# Patient Record
Sex: Male | Born: 1965 | Race: White | Hispanic: No | Marital: Married | Smoking: Never smoker
Health system: Southern US, Community
[De-identification: ages and names within clinical notes are randomized; demographics above are authoritative.]

## PROBLEM LIST (undated history)

## (undated) DIAGNOSIS — Z87442 Personal history of urinary calculi: Secondary | ICD-10-CM

## (undated) DIAGNOSIS — Z8481 Family history of carrier of genetic disease: Secondary | ICD-10-CM

## (undated) DIAGNOSIS — Z803 Family history of malignant neoplasm of breast: Secondary | ICD-10-CM

## (undated) DIAGNOSIS — E785 Hyperlipidemia, unspecified: Secondary | ICD-10-CM

## (undated) DIAGNOSIS — E119 Type 2 diabetes mellitus without complications: Secondary | ICD-10-CM

## (undated) DIAGNOSIS — B019 Varicella without complication: Secondary | ICD-10-CM

## (undated) DIAGNOSIS — F909 Attention-deficit hyperactivity disorder, unspecified type: Secondary | ICD-10-CM

## (undated) DIAGNOSIS — G4730 Sleep apnea, unspecified: Secondary | ICD-10-CM

## (undated) DIAGNOSIS — I10 Essential (primary) hypertension: Secondary | ICD-10-CM

## (undated) HISTORY — DX: Family history of carrier of genetic disease: Z84.81

## (undated) HISTORY — DX: Hyperlipidemia, unspecified: E78.5

## (undated) HISTORY — PX: SHOULDER SURGERY: SHX246

## (undated) HISTORY — DX: Type 2 diabetes mellitus without complications: E11.9

## (undated) HISTORY — DX: Family history of malignant neoplasm of breast: Z80.3

## (undated) HISTORY — DX: Varicella without complication: B01.9

## (undated) HISTORY — PX: MENISCUS REPAIR: SHX5179

---

## 2004-11-03 ENCOUNTER — Ambulatory Visit (HOSPITAL_COMMUNITY): Admission: RE | Admit: 2004-11-03 | Discharge: 2004-11-03 | Payer: Self-pay | Admitting: Family Medicine

## 2007-06-10 ENCOUNTER — Ambulatory Visit (HOSPITAL_COMMUNITY): Admission: RE | Admit: 2007-06-10 | Discharge: 2007-06-11 | Payer: Self-pay | Admitting: Orthopedic Surgery

## 2009-02-14 ENCOUNTER — Ambulatory Visit: Payer: Self-pay | Admitting: Specialist

## 2010-11-14 NOTE — Op Note (Signed)
James [name]              ACCOUNT NO.:  [id number]   MEDICAL RECORD NO.:  [id number]          PATIENT TYPE:  AMB   LOCATION:  SDS                          FACILITY:  [facility]   PHYSICIAN:  Burnard Bunting, M.D.    DATE OF BIRTH:  08-13-1965   DATE OF PROCEDURE:  06/10/2007  DATE OF DISCHARGE:                               OPERATIVE REPORT   PREOPERATIVE DIAGNOSIS:  Left shoulder labral tearing, acromioclavicular  joint arthritis, bursitis, extensive synovitis.   POSTOPERATIVE DIAGNOSIS:  Left shoulder labral tearing,  acromioclavicular joint arthritis, bursitis, extensive synovitis.   PROCEDURE PERFORMED:  Left shoulder diagnostic and operative  arthroscopy, with extensive debridement in biceps, extensive debridement  of labrum and synovitis, biceps tendon release, arthroscopic subacromial  decompression, open distal clavicle excision, open biceps tenodesis.   SURGEON:  Burnard Bunting, M.D.   ASSISTANT:  None.   ANESTHESIA:  General endotracheal.   ESTIMATED BLOOD LOSS:  50 mL.   DRAINS:  None.   INDICATIONS:  James [name] is a 45 year old male with left shoulder  pain, acromioclavicular joint arthritis by magnetic resonance imaging  scan, as well as labral tearing by magnetic resonance imaging scan, and  bursitis.  He is brought for operative management for failure of  conservative therapy.   PROCEDURE IN DETAIL:  The patient was brought to the operating room,  where general endotracheal anesthesia was induced and preoperative  antibiotics were administered.  The left shoulder was examined under  anesthesia and found to have forward flexion of 170, external rotation  of 15 degrees, abduction to 70, and good shoulder stability.  The  patient was placed in the beach chair position, with the head in neutral  position and the right arm well-padded.  His left shoulder, arm, and  hand were prepped and draped with antiseptic solution and draped in a  sterile manner.  James [name]  was used to cover the operative field.  Topographic mapping of the shoulder was then done, including the  posterolateral and anterior margin of the acromion, as well as the  coracoid process.  20 mL of saline was injected into the shoulder joint.  The scope was placed into the shoulder joint.  Diagnostic arthroscopy  was performed.  Synovitis was present.  An unstable degenerative labral  tear anterior and posterior to the biceps tendon was noted.  This was  felt to be un-repairable.  The biceps tendon was released.  Extensive  debridement of the labrum and surrounding synovitis was performed.  The  rotator cuff was intact.  The glenohumeral articular surface was intact.  Following extensive debridement and biceps tendon release, the scope was  placed into the subacromial space and a lateral port was created.  Subacromial decompression was performed with release, but no resection  of the CA ligament.  The rotator cuff appeared intact after the bursal  resection was performed.  At this time, the instruments were removed  from the portals, which were closed using 3-0 nylon suture.  An incision  was then made over the distal clavicle.  The skin and subcutaneous  tissue were sharply divided.  Full-thickness flaps were then developed  of the periosteum on the anterior and posterior aspect of the clavicle.  Approximately 10 mm of the distal clavicle was resected with the  oscillating saw.  The incision was irrigated.  The subperiosteal flaps  with the fascia was then closed using interrupted inverted #0 Vicryl  suture, and an interrupted inverted 2-0 Vicryl suture approximating the  skin edges.  This time, the anterior portal was extended distally.  The  deltoid was split and measured a distance of 3.5 cm from its anterior  portion.  Self-retaining numbers.  A #0 Vicryl suture was placed at the  base of the split.  The bicipital groove was then identified and the  transverse humeral ligament was  incised on the medial aspect.  The  biceps tendon was delivered into the incision and a #2 FiberWire was  placed through the biceps tendon.  The biceps tendon was then tenodesed  into a 7-mm drill hole made in the mid portion of the humeral head away  from the circumflex arteries but within the bicipital groove.  Secure  fixation was achieved at this time.  At this time, thorough irrigation  was performed.  The deltoid split was repaired using a #0 Vicryl suture.  The skin was closed using interrupted inverted #0 Vicryl suture, 2-0  Vicryl suture, and 3-0 pull-out Prolene on both incisions.  It should be  noted that this patient's morbid obesity made the case extremely  difficult in terms of retraction and gaining access to the joint.  Both  open procedures, the distal clavicle excision, and the biceps tenodesis  required significantly more retraction and effort than would be typical  for a non-morbidly obese patient.  The patient's body mass index easily  exceeds 35.  A bulky dressing was placed on the shoulder.  The shoulder  was placed in a shoulder immobilizer.  He was then transferred to the  recovery room in stable condition.      Burnard Bunting, M.D.  Electronically Signed     GSD/MEDQ  D:  06/10/2007  T:  06/10/2007  Job:  [id number]

## 2011-04-09 LAB — BASIC METABOLIC PANEL
BUN: 14
CO2: 27
Creatinine, Ser: 1.05
Potassium: 3.8
Sodium: 138

## 2011-04-09 LAB — CBC
HCT: 46.1
Hemoglobin: 15.6
MCHC: 33.9
RBC: 5.21
WBC: 8.5

## 2011-08-06 ENCOUNTER — Other Ambulatory Visit: Payer: Self-pay

## 2011-08-06 ENCOUNTER — Encounter (HOSPITAL_COMMUNITY): Payer: Self-pay | Admitting: Pharmacy Technician

## 2011-08-06 ENCOUNTER — Encounter (HOSPITAL_COMMUNITY): Payer: Self-pay

## 2011-08-06 ENCOUNTER — Encounter (HOSPITAL_COMMUNITY)
Admission: RE | Admit: 2011-08-06 | Discharge: 2011-08-06 | Disposition: A | Discharge status: Home / Self Care | Payer: BC Managed Care – PPO | Source: Ambulatory Visit | Attending: Urology | Admitting: Urology

## 2011-08-06 HISTORY — DX: Sleep apnea, unspecified: G47.30

## 2011-08-06 HISTORY — DX: Essential (primary) hypertension: I10

## 2011-08-06 LAB — SURGICAL PCR SCREEN: Staphylococcus aureus: POSITIVE — AB

## 2011-08-06 NOTE — Patient Instructions (Addendum)
20 Tykel Badie  08/06/2011   Your procedure is scheduled on:  08/07/11  Report to [facility] at 07:10 AM.  Call this number if you have problems the morning of surgery: [phone]   Remember:   Do not eat food:After Midnight.  May have clear liquids:until Midnight .  Clear liquids include soda, tea, black coffee, apple or grape juice, broth.  Take these medicines the morning of surgery with A SIP OF WATER: Lisionpril and Flomax. Take your Oxycodone only if needed.   Do not wear jewelry, make-up or nail polish.  Do not wear lotions, powders, or perfumes. You may wear deodorant.  Do not shave 48 hours prior to surgery.  Do not bring valuables to the hospital.  Contacts, dentures or bridgework may not be worn into surgery.  Leave suitcase in the car. After surgery it may be brought to your room.  For patients admitted to the hospital, checkout time is 11:00 AM the day of discharge.   Patients discharged the day of surgery will not be allowed to drive home.  Name and phone number of your driver:   Special Instructions: CHG Shower Use Special Wash: 1/2 bottle night before surgery and 1/2 bottle morning of surgery.   Please read over the following fact sheets that you were given: Pain Booklet, MRSA Information, Surgical Site Infection Prevention, Anesthesia Post-op Instructions and Care and Recovery After Surgery    Cystoscopy (Bladder Exam) Care After Refer to this sheet in the next few weeks. These discharge instructions provide you with general information on caring for yourself after you leave the hospital. Your caregiver may also give you specific instructions. Your treatment has been planned according to the most current medical practices available, but unavoidable complications sometimes occur. If you have any problems or questions after discharge, please call your caregiver. AFTER THE PROCEDURE   There may be temporary bleeding and burning with urination.   Drink enough water and  fluids to keep your urine clear or pale yellow.  FINDING OUT THE RESULTS OF YOUR TEST Not all test results are available during your visit. If your test results are not back during the visit, make an appointment with your caregiver to find out the results. Do not assume everything is normal if you have not heard from your caregiver or the medical facility. It is important for you to follow up on all of your test results. SEEK IMMEDIATE MEDICAL CARE IF:   There is an increase in blood in the urine or you are passing clots.   There is difficulty passing urine.   You develop the chills.   You have an oral temperature above 102 F (38.9 C), not controlled by medicine.   Belly (abdominal) pain develops.  Document Released: 01/05/2005 Document Revised: 02/28/2011 Document Reviewed: 11/03/2007 [facility] Patient Information 2012 [place], [place].   PATIENT INSTRUCTIONS POST-ANESTHESIA  IMMEDIATELY FOLLOWING SURGERY:  Do not drive or operate machinery for the first twenty four hours after surgery.  Do not make any important decisions for twenty four hours after surgery or while taking narcotic pain medications or sedatives.  If you develop intractable nausea and vomiting or a severe headache please notify your doctor immediately.  FOLLOW-UP:  Please make an appointment with your surgeon as instructed. You do not need to follow up with anesthesia unless specifically instructed to do so.  WOUND CARE INSTRUCTIONS (if applicable):  Keep a dry clean dressing on the anesthesia/puncture wound site if there is drainage.  Once the wound  has quit draining you may leave it open to air.  Generally you should leave the bandage intact for twenty four hours unless there is drainage.  If the epidural site drains for more than 36-48 hours please call the anesthesia department.  QUESTIONS?:  Please feel free to call your physician or the hospital operator if you have any questions, and they will be happy to assist  you.     [facility] Anesthesia Department [address] [place] [phone]

## 2011-08-07 ENCOUNTER — Ambulatory Visit (HOSPITAL_COMMUNITY): Payer: BC Managed Care – PPO

## 2011-08-07 ENCOUNTER — Ambulatory Visit (HOSPITAL_COMMUNITY): Payer: BC Managed Care – PPO | Admitting: Anesthesiology

## 2011-08-07 ENCOUNTER — Encounter (HOSPITAL_COMMUNITY): Payer: Self-pay | Admitting: Anesthesiology

## 2011-08-07 ENCOUNTER — Ambulatory Visit (HOSPITAL_COMMUNITY)
Admission: RE | Admit: 2011-08-07 | Discharge: 2011-08-07 | Disposition: A | Discharge status: Home / Self Care | Payer: BC Managed Care – PPO | Source: Ambulatory Visit | Attending: Urology | Admitting: Urology

## 2011-08-07 ENCOUNTER — Encounter (HOSPITAL_COMMUNITY): Payer: Self-pay | Admitting: *Deleted

## 2011-08-07 ENCOUNTER — Encounter (HOSPITAL_COMMUNITY)
Admission: RE | Disposition: A | Discharge status: Home / Self Care | Payer: Self-pay | Source: Ambulatory Visit | Attending: Urology

## 2011-08-07 DIAGNOSIS — I10 Essential (primary) hypertension: Secondary | ICD-10-CM | POA: Insufficient documentation

## 2011-08-07 DIAGNOSIS — Z01812 Encounter for preprocedural laboratory examination: Secondary | ICD-10-CM | POA: Insufficient documentation

## 2011-08-07 DIAGNOSIS — G4733 Obstructive sleep apnea (adult) (pediatric): Secondary | ICD-10-CM | POA: Insufficient documentation

## 2011-08-07 DIAGNOSIS — Z01810 Encounter for preprocedural cardiovascular examination: Secondary | ICD-10-CM | POA: Insufficient documentation

## 2011-08-07 DIAGNOSIS — Z79899 Other long term (current) drug therapy: Secondary | ICD-10-CM | POA: Insufficient documentation

## 2011-08-07 DIAGNOSIS — N201 Calculus of ureter: Secondary | ICD-10-CM | POA: Insufficient documentation

## 2011-08-07 HISTORY — PX: CYSTOSCOPY W/ URETERAL STENT PLACEMENT: SHX1429

## 2011-08-07 SURGERY — CYSTOSCOPY, WITH RETROGRADE PYELOGRAM AND URETERAL STENT INSERTION
Anesthesia: General | Laterality: Left | Wound class: Clean Contaminated

## 2011-08-07 MED ORDER — LIDOCAINE HCL 1 % IJ SOLN
INTRAMUSCULAR | Status: DC | PRN
  Administered 2011-08-07: 30 mg via INTRADERMAL

## 2011-08-07 MED ORDER — MIDAZOLAM HCL 2 MG/2ML IJ SOLN
1.0000 mg | INTRAMUSCULAR | Status: DC | PRN
  Administered 2011-08-07 (×2): 2 mg via INTRAVENOUS

## 2011-08-07 MED ORDER — SODIUM CHLORIDE 0.9 % IR SOLN
Status: DC | PRN
  Administered 2011-08-07: 6000 mL

## 2011-08-07 MED ORDER — ONDANSETRON HCL 4 MG/2ML IJ SOLN
4.0000 mg | Freq: Once | INTRAMUSCULAR | Status: AC
  Administered 2011-08-07: 4 mg via INTRAVENOUS

## 2011-08-07 MED ORDER — FENTANYL CITRATE 0.05 MG/ML IJ SOLN
INTRAMUSCULAR | Status: DC | PRN
  Administered 2011-08-07 (×3): 50 ug via INTRAVENOUS

## 2011-08-07 MED ORDER — ONDANSETRON HCL 4 MG/2ML IJ SOLN: 4.0000 mg | Freq: Once | INTRAMUSCULAR | Status: DC | PRN

## 2011-08-07 MED ORDER — MIDAZOLAM HCL 2 MG/2ML IJ SOLN
INTRAMUSCULAR | Status: AC
  Filled 2011-08-07: qty 2

## 2011-08-07 MED ORDER — FENTANYL CITRATE 0.05 MG/ML IJ SOLN
INTRAMUSCULAR | Status: AC
  Filled 2011-08-07: qty 5

## 2011-08-07 MED ORDER — LIDOCAINE HCL (PF) 1 % IJ SOLN
INTRAMUSCULAR | Status: AC
  Filled 2011-08-07: qty 5

## 2011-08-07 MED ORDER — ONDANSETRON HCL 4 MG/2ML IJ SOLN
INTRAMUSCULAR | Status: AC
  Filled 2011-08-07: qty 2

## 2011-08-07 MED ORDER — PROPOFOL 10 MG/ML IV EMUL
INTRAVENOUS | Status: AC
  Filled 2011-08-07: qty 20

## 2011-08-07 MED ORDER — LACTATED RINGERS IV SOLN
INTRAVENOUS | Status: DC
  Administered 2011-08-07: 09:00:00 via INTRAVENOUS

## 2011-08-07 MED ORDER — ROCURONIUM BROMIDE 100 MG/10ML IV SOLN
INTRAVENOUS | Status: DC | PRN
  Administered 2011-08-07: 35 mg via INTRAVENOUS

## 2011-08-07 MED ORDER — ROCURONIUM BROMIDE 50 MG/5ML IV SOLN
INTRAVENOUS | Status: AC
  Filled 2011-08-07: qty 1

## 2011-08-07 MED ORDER — IOHEXOL 350 MG/ML SOLN
INTRAVENOUS | Status: DC | PRN
  Administered 2011-08-07: 50 mL

## 2011-08-07 MED ORDER — FENTANYL CITRATE 0.05 MG/ML IJ SOLN: 25.0000 ug | INTRAMUSCULAR | Status: DC | PRN

## 2011-08-07 MED ORDER — MUPIROCIN 2 % EX OINT
TOPICAL_OINTMENT | CUTANEOUS | Status: AC
  Filled 2011-08-07: qty 22

## 2011-08-07 MED ORDER — PROPOFOL 10 MG/ML IV BOLUS
INTRAVENOUS | Status: DC | PRN
  Administered 2011-08-07: 200 mg via INTRAVENOUS

## 2011-08-07 MED ORDER — GLYCOPYRROLATE 0.2 MG/ML IJ SOLN
INTRAMUSCULAR | Status: AC
  Filled 2011-08-07: qty 1

## 2011-08-07 MED ORDER — NEOSTIGMINE METHYLSULFATE 1 MG/ML IJ SOLN
INTRAMUSCULAR | Status: AC
  Filled 2011-08-07: qty 10

## 2011-08-07 MED ORDER — STERILE WATER FOR IRRIGATION IR SOLN
Status: DC | PRN
  Administered 2011-08-07: 1000 mL

## 2011-08-07 SURGICAL SUPPLY — 20 items
BAG DRAIN URO TABLE W/ADPT NS (DRAPE) ×2 IMPLANT
CATH 5 FR WEDGE TIP (UROLOGICAL SUPPLIES) ×2 IMPLANT
CLOTH BEACON ORANGE TIMEOUT ST (SAFETY) ×2 IMPLANT
DILATOR UROMAX ULTRA (MISCELLANEOUS) ×2 IMPLANT
GLOVE BIO SURGEON STRL SZ7 (GLOVE) ×2 IMPLANT
GLOVE ECLIPSE 7.0 STRL STRAW (GLOVE) ×2 IMPLANT
GLOVE EXAM NITRILE MD LF STRL (GLOVE) ×2 IMPLANT
GLOVE INDICATOR 7.5 STRL GRN (GLOVE) ×2 IMPLANT
GOWN STRL REIN XL XLG (GOWN DISPOSABLE) ×4 IMPLANT
IV NS IRRIG 3000ML ARTHROMATIC (IV SOLUTION) ×4 IMPLANT
KIT ROOM TURNOVER AP CYSTO (KITS) ×2 IMPLANT
LASER FIBER DISP (UROLOGICAL SUPPLIES) IMPLANT
LASER FIBER DISP 1000U (UROLOGICAL SUPPLIES) IMPLANT
MANIFOLD NEPTUNE II (INSTRUMENTS) ×2 IMPLANT
PACK CYSTO (CUSTOM PROCEDURE TRAY) ×2 IMPLANT
PAD ARMBOARD 7.5X6 YLW CONV (MISCELLANEOUS) ×2 IMPLANT
STENT PERCUFLEX 4.8FRX24 (STENTS) ×2 IMPLANT
STONE RETRIEVAL GEMINI 2.4 FR (MISCELLANEOUS) IMPLANT
TOWEL OR 17X26 4PK STRL BLUE (TOWEL DISPOSABLE) IMPLANT
WIRE GUIDE BENTSON .035 15CM (WIRE) ×2 IMPLANT

## 2011-08-07 NOTE — Anesthesia Procedure Notes (Signed)
Procedure Name: Intubation Date/Time: 08/07/2011 9:41 AM Performed by: Glynn Octave Pre-anesthesia Checklist: Patient identified, Patient being monitored, Timeout performed, Emergency Drugs available and Suction available Patient Re-evaluated:Patient Re-evaluated prior to inductionOxygen Delivery Method: Circle System Utilized Preoxygenation: Pre-oxygenation with 100% oxygen Intubation Type: IV induction Ventilation: Mask ventilation without difficulty Laryngoscope Size: Mac and 3 Grade View: Grade I Tube type: Oral Tube size: 7.0 mm Number of attempts: 1 Airway Equipment and Method: stylet Placement Confirmation: ETT inserted through vocal cords under direct vision,  positive ETCO2 and breath sounds checked- equal and bilateral Secured at: 22 cm Tube secured with: Tape Dental Injury: Teeth and Oropharynx as per pre-operative assessment

## 2011-08-07 NOTE — Brief Op Note (Signed)
08/07/2011  10:21 AM  PATIENT:  James [name]  46 y.o. male  PRE-OPERATIVE DIAGNOSIS:  left ureteral calculus  POST-OPERATIVE DIAGNOSIS:  left ureteral calculus  PROCEDURE:  Procedure(s): CYSTOSCOPY WITH RETROGRADE PYELOGRAM/URETERAL STENT PLACEMENT  SURGEON:  Surgeon(s): Ky Barban, MD  PHYSICIAN ASSISTANT:   ASSISTANTS: none   ANESTHESIA:   general  EBL:  Total I/O In: 200 [I.V.:200] Out: -   BLOOD ADMINISTERED:none  DRAINS: none   LOCAL MEDICATIONS USED:  NONE  SPECIMEN:  No Specimen  DISPOSITION OF SPECIMEN:  N/A  COUNTS:  YES  TOURNIQUET:  * No tourniquets in log *  DICTATION: .dictation#[id number]  PLAN OF CARE: Discharge to home after PACU  PATIENT DISPOSITION:  PACU - hemodynamically stable.   Delay start of Pharmacological VTE agent (>24hrs) due to surgical blood loss or risk of bleeding:  {YES

## 2011-08-07 NOTE — Transfer of Care (Signed)
Immediate Anesthesia Transfer of Care Note  [name]  Procedure(s) Performed:  CYSTOSCOPY WITH RETROGRADE PYELOGRAM/URETERAL STENT PLACEMENT - Cystoscopy, Left Retrograde Pyelogram, Left Ureteral Ballon Dilation, Double J Stent Placement  Patient Location: PACU  Anesthesia Type: General  Level of Consciousness: awake, alert  and oriented  Airway & Oxygen Therapy: Patient Spontanous Breathing and Patient connected to face mask oxygen  Post-op Assessment: Report given to PACU RN  Post vital signs: Reviewed and stable  Complications: No apparent anesthesia complications

## 2011-08-07 NOTE — Progress Notes (Signed)
Needs to void. Urinal provided. Unable to void. Wants to get up and go to BR. Rates pain 0.

## 2011-08-07 NOTE — Progress Notes (Signed)
No change in H&P on reexamination. 

## 2011-08-07 NOTE — Consult Note (Signed)
NAMEELVERT, CUMPTON              ACCOUNT NO.:  [id number]  MEDICAL RECORD NO.:  [id number]  LOCATION:                                 FACILITY:  PHYSICIAN:  Ky Barban, M.D.DATE OF BIRTH:  [birth year]  DATE OF CONSULTATION: DATE OF DISCHARGE:                                HISTORY AND PHYSICAL  CHIEF COMPLAINT:  Recurrent left renal colic.  HISTORY:  46 year old gentleman having recurrent episodes in the left flank since last Sunday.  He has to go to the emergency room twice because of continued pain.  CT scan shows 4-mm stone in the distal left ureter causing partial obstruction.  He called today that he is having so much pain.  He wants something done about it.  So I told him to come to the office.  He was having considerable pain, so I advised him that we can do one of these two things, either lithotripsy of stone basket, but I will recommend to have stone basket.  Procedure of stone basket was explained thoroughly to him and to his wife, and its complications were discussed, especially ureteral perforation leading to open surgery, requiring longer period of time to recover and use of double-J stent, and stone migration.  They were all discussed with him.  He understands and they want me to go ahead and proceed.  He will be coming in the morning and undergo cystoscopy left retrograde pyelogram, ureteroscopic stone basket extraction, holmium laser lithotripsy, insertion of double-J stent.  Under anesthesia as an outpatient.  PAST MEDICAL HISTORY:  He does have hypertension and takes medicines. No history of diabetes, but he says he is borderline diabetic, just watches his diet.  PREVIOUS SURGICAL HISTORY:  Surgery on both shoulders 5 years ago and 3 years ago, and no other surgery.  FAMILY HISTORY:  No history of prostate cancer.  PERSONAL HISTORY:  He does not smoke or drink.  REVIEW OF SYSTEMS:  Unremarkable.  PHYSICAL EXAMINATION:  GENERAL:  Moderately  obese male, not in acute distress. VITAL SIGNS:  Blood pressure 140/80, temperature is normal. NEUROLOGICAL:  Central nervous system no gross neurological deficit. HEAD, NECK, EYES, ENT:  negative CHEST:  Symmetrical. HEART:  Regular sinus rhythm.  No murmur. ABDOMEN:  Soft and flat.  Liver, spleen, kidneys not palpable.  1+ left CVA tenderness. GENITALIA :  External genitalia is circumcised meatus adequate. Testicles are normal.  Rectal examination is deferred.  Extremities are normal.  IMPRESSIONS:  Recurrent left renal colic, distal left ureteral calculus.  PLAN:  Cystoscopy, left retrograde pyelogram, ureteroscopic stone basket extraction, and holmium laser lithotripsy, insertion of double-J stent under anesthesia as outpatient.     Ky Barban, M.D.     MIJ/MEDQ  D:  08/06/2011  T:  08/07/2011  Job:  [id number]

## 2011-08-07 NOTE — Anesthesia Preprocedure Evaluation (Signed)
Anesthesia Evaluation  Patient identified by MRN, date of birth, ID band Patient awake    Reviewed: Allergy & Precautions, H&P , NPO status , Patient's Chart, lab work & pertinent test results  History of Anesthesia Complications Negative for: history of anesthetic complications  Airway Mallampati: I      Dental  (+) Teeth Intact   Pulmonary sleep apnea and Continuous Positive Airway Pressure Ventilation ,  clear to auscultation        Cardiovascular hypertension, Regular Normal    Neuro/Psych    GI/Hepatic   Endo/Other  Morbid obesity  Renal/GU      Musculoskeletal   Abdominal   Peds  Hematology   Anesthesia Other Findings   Reproductive/Obstetrics                           Anesthesia Physical Anesthesia Plan  ASA: II  Anesthesia Plan: General   Post-op Pain Management:    Induction: Intravenous  Airway Management Planned: Oral ETT  Additional Equipment:   Intra-op Plan:   Post-operative Plan: Extubation in OR  Informed Consent: I have reviewed the patients History and Physical, chart, labs and discussed the procedure including the risks, benefits and alternatives for the proposed anesthesia with the patient or authorized representative who has indicated his/her understanding and acceptance.     Plan Discussed with:   Anesthesia Plan Comments:         Anesthesia Quick Evaluation

## 2011-08-07 NOTE — Anesthesia Postprocedure Evaluation (Signed)
  Anesthesia Post-op Note  [name]  Procedure(s) Performed:  CYSTOSCOPY WITH RETROGRADE PYELOGRAM/URETERAL STENT PLACEMENT - Cystoscopy, Left Retrograde Pyelogram, Left Ureteral Ballon Dilation, Double J Stent Placement  Patient Location: PACU  Anesthesia Type: General  Level of Consciousness: awake, alert  and oriented  Airway and Oxygen Therapy: Patient Spontanous Breathing and Patient connected to face mask oxygen  Post-op Pain: none  Post-op Assessment: Post-op Vital signs reviewed, Patient's Cardiovascular Status Stable, Respiratory Function Stable and No signs of Nausea or vomiting  Post-op Vital Signs: Reviewed and stable  Complications: No apparent anesthesia complications

## 2011-08-08 NOTE — Op Note (Signed)
NAMEETHON, WYMER              ACCOUNT NO.:  [id number]  MEDICAL RECORD NO.:  [id number]  LOCATION:                                 FACILITY:  PHYSICIAN:  Ky Barban, M.D.DATE OF BIRTH:  Jul 31, 1965  DATE OF PROCEDURE: DATE OF DISCHARGE:                              OPERATIVE REPORT   PREOPERATIVE DIAGNOSIS:  Left ureteral calculus.  POSTOPERATIVE DIAGNOSIS:  Left ureteral calculus.  PROCEDURE:  Cystoscopy, left retrograde pyelogram, ureteroscopic stone basket extraction, and insertion of double-J stent 5-French 24 cm with string attached.  ANESTHESIA:  General endotracheal.  PROCEDURE IN DETAIL:  The patient under general endotracheal anesthesia in lithotomy position, usual prep and drape, #25 cystoscope introduced into the bladder and bladder was inspected.  Left ureteral orifice catheterized with a wedge catheter Hypaque injected under fluoroscopic control.  There is a filling defect in the ureterovesical junction.  The ureter above that is dilated, grade 2.  Now a guidewire was passed up into the renal pelvis and cystoscope was removed.  Over the guidewire #5 balloon dilator was introduced and the intramural ureter was dilated. The balloon was removed along with the cystoscope and alongside the guidewire, I introduced short rigid ureteroscope, went to the level of the stone.  Stone came out.  When we took the balloon and it fell into the bladder.  The ureter looks fine and I went up all the way in the mid ureter.  No other stone is seen.  The ureteroscope was removed and I introduced the cystoscope, and the stone came out, and I picked up. Over the guidewire, I introduced double-J stent 5-French 24 cm.  It was positioned within the renal pelvis and the bladder.  After removing the guidewire, nice loop in the bladder and the renal pelvis, was obtained, and the string was left attached.  The patient left the operating room in satisfactory  condition.     Ky Barban, M.D.     MIJ/MEDQ  D:  08/07/2011  T:  08/08/2011  Job:  [id number]

## 2011-08-10 ENCOUNTER — Encounter (HOSPITAL_COMMUNITY): Payer: Self-pay | Admitting: Urology

## 2011-09-21 ENCOUNTER — Other Ambulatory Visit (HOSPITAL_COMMUNITY): Payer: Self-pay | Admitting: Internal Medicine

## 2011-09-21 DIAGNOSIS — R918 Other nonspecific abnormal finding of lung field: Secondary | ICD-10-CM

## 2011-09-25 ENCOUNTER — Ambulatory Visit (HOSPITAL_COMMUNITY)
Admission: RE | Admit: 2011-09-25 | Discharge: 2011-09-25 | Disposition: A | Discharge status: Home / Self Care | Payer: BC Managed Care – PPO | Source: Ambulatory Visit | Attending: Internal Medicine | Admitting: Internal Medicine

## 2011-09-25 DIAGNOSIS — R918 Other nonspecific abnormal finding of lung field: Secondary | ICD-10-CM | POA: Insufficient documentation

## 2014-04-21 DIAGNOSIS — G4733 Obstructive sleep apnea (adult) (pediatric): Secondary | ICD-10-CM | POA: Insufficient documentation

## 2014-10-15 ENCOUNTER — Other Ambulatory Visit: Payer: Self-pay | Admitting: Orthopedic Surgery

## 2014-10-15 DIAGNOSIS — M25512 Pain in left shoulder: Secondary | ICD-10-CM

## 2014-11-02 ENCOUNTER — Other Ambulatory Visit: Payer: BC Managed Care – PPO

## 2014-11-23 ENCOUNTER — Other Ambulatory Visit: Payer: BC Managed Care – PPO

## 2015-03-14 ENCOUNTER — Ambulatory Visit: Payer: BC Managed Care – PPO | Admitting: Family

## 2015-03-23 ENCOUNTER — Encounter: Payer: Self-pay | Admitting: Family

## 2015-03-23 ENCOUNTER — Ambulatory Visit (INDEPENDENT_AMBULATORY_CARE_PROVIDER_SITE_OTHER): Payer: BC Managed Care – PPO | Admitting: Family

## 2015-03-23 ENCOUNTER — Other Ambulatory Visit (INDEPENDENT_AMBULATORY_CARE_PROVIDER_SITE_OTHER): Payer: BC Managed Care – PPO

## 2015-03-23 VITALS — BP 138/92 | HR 78 | Temp 98.5°F | Resp 18 | Ht 67.0 in | Wt 289.0 lb

## 2015-03-23 DIAGNOSIS — IMO0002 Reserved for concepts with insufficient information to code with codable children: Secondary | ICD-10-CM

## 2015-03-23 DIAGNOSIS — E1165 Type 2 diabetes mellitus with hyperglycemia: Secondary | ICD-10-CM | POA: Diagnosis not present

## 2015-03-23 DIAGNOSIS — Z23 Encounter for immunization: Secondary | ICD-10-CM | POA: Diagnosis not present

## 2015-03-23 DIAGNOSIS — R21 Rash and other nonspecific skin eruption: Secondary | ICD-10-CM | POA: Diagnosis not present

## 2015-03-23 LAB — MICROALBUMIN / CREATININE URINE RATIO
CREATININE, U: 88.2 mg/dL
MICROALB UR: 1.2 mg/dL (ref 0.0–1.9)
Microalb Creat Ratio: 1.4 mg/g (ref 0.0–30.0)

## 2015-03-23 LAB — HEMOGLOBIN A1C: Hgb A1c MFr Bld: 10.2 % — ABNORMAL HIGH (ref 4.6–6.5)

## 2015-03-23 MED ORDER — MUPIROCIN CALCIUM 2 % EX CREA: 1.0000 "application " | TOPICAL_CREAM | Freq: Two times a day (BID) | CUTANEOUS | Status: DC

## 2015-03-23 NOTE — Progress Notes (Signed)
Pre visit review using our clinic review tool, if applicable. No additional management support is needed unless otherwise documented below in the visit note. 

## 2015-03-23 NOTE — Assessment & Plan Note (Signed)
Rash appears to related to irritation. Keep clean and dry. Start mupirocin for inflammation and prevent infection. Follow up if symptoms worsen or fail to improve.

## 2015-03-23 NOTE — Assessment & Plan Note (Addendum)
Diabetes most likely uncontrolled with current regimen secondary to multiple courses of prednisone and nasal corticosteroids. Takes his medications as prescribed and has not had any recent hypoglycemic events. Diabetic eye exam is up to date. Diabetic foot exam completed today. Obtain A1c and urine microalbumin. Due for pneumovax which will be administered at next visit secondary to multiple injections today. Currently on simvastatin and lisinopril. Continue current dosage of metformin, Novolog and Levemir. Follow up pending A1c results.

## 2015-03-23 NOTE — Progress Notes (Addendum)
Subjective:    Patient ID: James [name], male    DOB: 1966/02/21, 49 y.o.   MRN: [id number]  Chief Complaint  Patient presents with  . Establish Care    needs A1c check, thinks its high bc he was taking steroids, wants to talk about insulin, and states he can not get his sugars are not stable they are up and down    HPI:  James [name] is a 49 y.o. male who  has a past medical history of Hypertension; Sleep apnea; Hyperlipidemia; Diabetes mellitus without complication; Chicken pox; and Kidney stones. and presents today for an office visit to establish care.  1.) Type 2 diabetes - Previously diagnosed with Type 2 diabetes and is currently maintained on Levemir, Novolog and metformin. Takes his medications as prescribed and denies adverse side effects or hypoglycemic events. Reports recently taking steroids and having difficulty getting his sugars stable. Morning blood sugars have normally around 80-110 and with steroids around 160-180. Has had episodes of higher blood sugar during the day. Indicates that he has stopped taking the all of the steroids and his blood sugars are slowly reducing back to where they were prior. Diabetic eye exam is up to date from about 8 months ago. Has lost 30 pounds in the last 2 months and is working on exercise as well.  2.) Rash - This is a new problem. Associated symptom of a rash located in his left axilla has been going on for several days since he was out in the rain with a wet shirt. Described as red and irritated. Denies any modifying factors or attempted treatments.    No Known Allergies   Outpatient Prescriptions Prior to Visit  Medication Sig Dispense Refill  . lisinopril (PRINIVIL,ZESTRIL) 20 MG tablet Take 20 mg by mouth daily.    Marland Kitchen ibuprofen (ADVIL,MOTRIN) 200 MG tablet Take 400 mg by mouth every 6 (six) hours as needed. For pain    . oxyCODONE-acetaminophen (PERCOCET) 5-325 MG per tablet Take 1 tablet by mouth every 4 (four) hours as  needed. For pain     No facility-administered medications prior to visit.     Past Medical History  Diagnosis Date  . Hypertension   . Sleep apnea     CiPaP at night  . Hyperlipidemia   . Diabetes mellitus without complication   . Chicken pox   . Kidney stones      Past Surgical History  Procedure Laterality Date  . Shoulder surgery      bilateral  . Cystoscopy w/ ureteral stent placement  08/07/2011    Procedure: CYSTOSCOPY WITH RETROGRADE PYELOGRAM/URETERAL STENT PLACEMENT;  Surgeon: Ky Barban, MD;  Location: [facility];  Service: Urology;  Laterality: Left;  Cystoscopy, Left Retrograde Pyelogram, Left Ureteral Ballon Dilation, Double J Stent Placement     Family History  Problem Relation Age of Onset  . Anesthesia problems Neg Hx   . Hypotension Neg Hx   . Malignant hyperthermia Neg Hx   . Pseudochol deficiency Neg Hx   . Prostate cancer Father   . Diabetes Father      Social History   Social History  . Marital Status: Married    Spouse Name: N/A  . Number of Children: 2  . Years of Education: 14   Occupational History  . Maintenance Mechanic    Social History Main Topics  . Smoking status: Never Smoker   . Smokeless tobacco: Never Used  . Alcohol Use: No  .  Drug Use: No  . Sexual Activity: Not on file   Other Topics Concern  . Not on file   Social History Narrative   Fun: Works in his shop and work on [employer], farm   Denies religious beliefs effecting health care.     Review of Systems  Constitutional: Negative for fatigue.  Respiratory: Negative for chest tightness and shortness of breath.   Cardiovascular: Negative for chest pain, palpitations and leg swelling.  Endocrine: Negative for polydipsia, polyphagia and polyuria.  Skin: Positive for rash.  Neurological: Negative for headaches.      Objective:    BP 138/92 mmHg  Pulse 78  Temp(Src) 98.5 F (36.9 C) (Oral)  Resp 18  Ht  (1.702 m)  Wt 289 lb (131.09 kg)  BMI 45.25  kg/m2  SpO2 97% Nursing note and vital signs reviewed.  Physical Exam  Constitutional: He is oriented to person, place, and time. He appears well-developed and well-nourished. No distress.  Cardiovascular: Normal rate, regular rhythm, normal heart sounds and intact distal pulses.   Pulmonary/Chest: Effort normal and breath sounds normal.  Neurological: He is alert and oriented to person, place, and time.  Diabetic foot exam - bilateral feet are free from skin breakdown, cuts, and abrasions. Toenails are neatly trimmed. Pulses are intact and appropriate. Sensation is intact to monofilament bilaterally.  Skin: Skin is warm and dry.  Red and raw appearing skin with no evidence of skin breakdown located under the left axilla.   Psychiatric: He has a normal mood and affect. His behavior is normal. Judgment and thought content normal.       Assessment & Plan:   Problem List Items Addressed This Visit      Musculoskeletal and Integument   Rash and nonspecific skin eruption    Rash appears to related to irritation. Keep clean and dry. Start mupirocin for inflammation and prevent infection. Follow up if symptoms worsen or fail to improve.       Relevant Medications   mupirocin cream (BACTROBAN) 2 %     Other   Type 2 diabetes mellitus, uncontrolled - Primary    Diabetes most likely uncontrolled with current regimen secondary to multiple courses of prednisone and nasal corticosteroids. Takes his medications as prescribed and has not had any recent hypoglycemic events. Diabetic eye exam is up to date. Diabetic foot exam completed today. Obtain A1c and urine microalbumin. Due for pneumovax which will be administered at next visit secondary to multiple injections today. Currently on simvastatin and lisinopril. Continue current dosage of metformin, Novolog and Levemir. Follow up pending A1c results.        Relevant Medications   metFORMIN (GLUCOPHAGE) 500 MG tablet   simvastatin (ZOCOR) 20 MG  tablet   insulin detemir (LEVEMIR) 100 UNIT/ML injection   Insulin Aspart (NOVOLOG [place])   Other Relevant Orders   Hemoglobin A1c (Completed)   Urine Microalbumin w/creat. ratio (Completed)    Other Visit Diagnoses    Encounter for immunization        Need for vaccine for TD (tetanus-diphtheria)        Relevant Orders    Td vaccine greater than or equal to 7yo preservative free IM (Completed)

## 2015-03-23 NOTE — Patient Instructions (Signed)
Thank you for choosing [facility].  Summary/Instructions:  Your prescription(s) have been submitted to your pharmacy or been printed and provided for you. Please take as directed and contact our office if you believe you are having problem(s) with the medication(s) or have any questions.  Pased to MyChart (or called to you) after review, usually within 72 hours after test compllease stop by the lab on the basement level of the building for your blood work. Your results will be releetion. If any changes need to be made, you will be notified at that same time.  If your symptoms worsen or fail to improve, please contact our office for further instruction, or in case of emergency go directly to the emergency room at the closest medical facility.    Diabetes Mellitus and Food It is important for you to manage your blood sugar (glucose) level. Your blood glucose level can be greatly affected by what you eat. Eating healthier foods in the appropriate amounts throughout the day at about the same time each day will help you control your blood glucose level. It can also help slow or prevent worsening of your diabetes mellitus. Healthy eating may even help you improve the level of your blood pressure and reach or maintain a healthy weight.  HOW CAN FOOD AFFECT ME? Carbohydrates Carbohydrates affect your blood glucose level more than any other type of food. Your dietitian will help you determine how many carbohydrates to eat at each meal and teach you how to count carbohydrates. Counting carbohydrates is important to keep your blood glucose at a healthy level, especially if you are using insulin or taking certain medicines for diabetes mellitus. Alcohol Alcohol can cause sudden decreases in blood glucose (hypoglycemia), especially if you use insulin or take certain medicines for diabetes mellitus. Hypoglycemia can be a life-threatening condition. Symptoms of hypoglycemia (sleepiness, dizziness, and  disorientation) are similar to symptoms of having too much alcohol.  If your health care provider has given you approval to drink alcohol, do so in moderation and use the following guidelines:  Women should not have more than one drink per day, and men should not have more than two drinks per day. One drink is equal to:  12 oz of beer.  5 oz of wine.  1 oz of hard liquor.  Do not drink on an empty stomach.  Keep yourself hydrated. Have water, diet soda, or unsweetened iced tea.  Regular soda, juice, and other mixers might contain a lot of carbohydrates and should be counted. WHAT FOODS ARE NOT RECOMMENDED? As you make food choices, it is important to remember that all foods are not the same. Some foods have fewer nutrients per serving than other foods, even though they might have the same number of calories or carbohydrates. It is difficult to get your body what it needs when you eat foods with fewer nutrients. Examples of foods that you should avoid that are high in calories and carbohydrates but low in nutrients include:  Trans fats (most processed foods list trans fats on the Nutrition Facts label).  Regular soda.  Juice.  Candy.  Sweets, such as cake, pie, doughnuts, and cookies.  Fried foods. WHAT FOODS CAN I EAT? Have nutrient-rich foods, which will nourish your body and keep you healthy. The food you should eat also will depend on several factors, including:  The calories you need.  The medicines you take.  Your weight.  Your blood glucose level.  Your blood pressure level.  Your cholesterol  level. You also should eat a variety of foods, including:  Protein, such as meat, poultry, fish, tofu, nuts, and seeds (lean animal proteins are best).  Fruits.  Vegetables.  Dairy products, such as milk, cheese, and yogurt (low fat is best).  Breads, grains, pasta, cereal, rice, and beans.  Fats such as olive oil, trans fat-free margarine, canola oil, avocado, and  olives. DOES EVERYONE WITH DIABETES MELLITUS HAVE THE SAME MEAL PLAN? Because every person with diabetes mellitus is different, there is not one meal plan that works for everyone. It is very important that you meet with a dietitian who will help you create a meal plan that is just right for you. Document Released: 03/15/2005 Document Revised: 06/23/2013 Document Reviewed: 05/15/2013 [facility] Patient Information 2015 [place], [place]. This information is not intended to replace advice given to you by your health care provider. Make sure you discuss any questions you have with your health care provider.  Exercise to Lose Weight Exercise and a healthy diet may help you lose weight. Your doctor may suggest specific exercises. EXERCISE IDEAS AND TIPS  Choose low-cost things you enjoy doing, such as walking, bicycling, or exercising to workout videos.  Take stairs instead of the elevator.  Walk during your lunch break.  Park your car further away from work or school.  Go to a gym or an exercise class.  Start with 5 to 10 minutes of exercise each day. Build up to 30 minutes of exercise 4 to 6 days a week.  Wear shoes with good support and comfortable clothes.  Stretch before and after working out.  Work out until you breathe harder and your heart beats faster.  Drink extra water when you exercise.  Do not do so much that you hurt yourself, feel dizzy, or get very short of breath. Exercises that burn about 150 calories:  Running 1  miles in 15 minutes.  Playing volleyball for 45 to 60 minutes.  Washing and waxing a car for 45 to 60 minutes.  Playing touch football for 45 minutes.  Walking 1  miles in 35 minutes.  Pushing a stroller 1  miles in 30 minutes.  Playing basketball for 30 minutes.  Raking leaves for 30 minutes.  Bicycling 5 miles in 30 minutes.  Walking 2 miles in 30 minutes.  Dancing for 30 minutes.  Shoveling snow for 15 minutes.  Swimming laps for 20  minutes.  Walking up stairs for 15 minutes.  Bicycling 4 miles in 15 minutes.  Gardening for 30 to 45 minutes.  Jumping rope for 15 minutes.  Washing windows or floors for 45 to 60 minutes. Document Released: 07/21/2010 Document Revised: 09/10/2011 Document Reviewed: 07/21/2010 [facility] Patient Information 2015 [place], [place]. This information is not intended to replace advice given to you by your health care provider. Make sure you discuss any questions you have with your health care provider. Diabetes and Exercise Exercising regularly is important. It is not just about losing weight. It has many health benefits, such as:  Improving your overall fitness, flexibility, and endurance.  Increasing your bone density.  Helping with weight control.  Decreasing your body fat.  Increasing your muscle strength.  Reducing stress and tension.  Improving your overall health. People with diabetes who exercise gain additional benefits because exercise:  Reduces appetite.  Improves the body's use of blood sugar (glucose).  Helps lower or control blood glucose.  Decreases blood pressure.  Helps control blood lipids (such as cholesterol and triglycerides).  Improves the  body's use of the hormone insulin by:  Increasing the body's insulin sensitivity.  Reducing the body's insulin needs.  Decreases the risk for heart disease because exercising:  Lowers cholesterol and triglycerides levels.  Increases the levels of good cholesterol (such as high-density lipoproteins [HDL]) in the body.  Lowers blood glucose levels. YOUR ACTIVITY PLAN  Choose an activity that you enjoy and set realistic goals. Your health care provider or diabetes educator can help you make an activity plan that works for you. Exercise regularly as directed by your health care provider. This includes:  Performing resistance training twice a week such as push-ups, sit-ups, lifting weights, or using resistance  bands.  Performing 150 minutes of cardio exercises each week such as walking, running, or playing sports.  Staying active and spending no more than 90 minutes at one time being inactive. Even short bursts of exercise are good for you. Three 10-minute sessions spread throughout the day are just as beneficial as a single 30-minute session. Some exercise ideas include:  Taking the dog for a walk.  Taking the stairs instead of the elevator.  Dancing to your favorite song.  Doing an exercise video.  Doing your favorite exercise with a friend. RECOMMENDATIONS FOR EXERCISING WITH TYPE 1 OR TYPE 2 DIABETES   Check your blood glucose before exercising. If blood glucose levels are greater than 240 mg/dL, check for urine ketones. Do not exercise if ketones are present.  Avoid injecting insulin into areas of the body that are going to be exercised. For example, avoid injecting insulin into:  The arms when playing tennis.  The legs when jogging.  Keep a record of:  Food intake before and after you exercise.  Expected peak times of insulin action.  Blood glucose levels before and after you exercise.  The type and amount of exercise you have done.  Review your records with your health care provider. Your health care provider will help you to develop guidelines for adjusting food intake and insulin amounts before and after exercising.  If you take insulin or oral hypoglycemic agents, watch for signs and symptoms of hypoglycemia. They include:  Dizziness.  Shaking.  Sweating.  Chills.  Confusion.  Drink plenty of water while you exercise to prevent dehydration or heat stroke. Body water is lost during exercise and must be replaced.  Talk to your health care provider before starting an exercise program to make sure it is safe for you. Remember, almost any type of activity is better than none. Document Released: 09/08/2003 Document Revised: 11/02/2013 Document Reviewed:  11/25/2012 [facility] Patient Information 2015 [place], [place]. This information is not intended to replace advice given to you by your health care provider. Make sure you discuss any questions you have with your health care provider.

## 2015-03-24 ENCOUNTER — Telehealth: Payer: Self-pay | Admitting: Family

## 2015-03-24 NOTE — Telephone Encounter (Signed)
Please inform patient that his A1c is 10.8 which indicates that his diabetes is elevated, however we were prepared for numbers like this. Please have him record his morning blood sugars over the next week and call them in for evaluation. If they continue to remain elevated then we may recommend some medication changes. One of the medications I would like him to evaluate would be either Victoza or Bydureon. The benefit of the Victoza would be that it has labeled uses for weight loss in addition to diabetes.

## 2015-03-27 ENCOUNTER — Encounter: Payer: Self-pay | Admitting: Family

## 2015-03-28 NOTE — Telephone Encounter (Signed)
Pt aware of results and to call in with sugars over the next week.

## 2015-05-06 ENCOUNTER — Telehealth: Payer: Self-pay | Admitting: Family

## 2015-05-06 NOTE — Telephone Encounter (Signed)
Pt was wanting to go ahead and switch to the victoza and he had a few questions regarding this. He did not get his previous pen refilled due to this. Please call pt at [phone]

## 2015-05-09 ENCOUNTER — Other Ambulatory Visit: Payer: Self-pay | Admitting: Family

## 2015-05-09 MED ORDER — LIRAGLUTIDE 18 MG/3ML ~~LOC~~ SOPN: PEN_INJECTOR | SUBCUTANEOUS | Status: DC

## 2015-05-09 NOTE — Telephone Encounter (Signed)
Based on his last A1c we would start the Victoza and continue with other insulins. If his blood sugars trend down then we can decrease the multiple daily doses of insulin. Were all of the questions answered?

## 2015-05-09 NOTE — Telephone Encounter (Signed)
Pt wants to try victoza, wants to know if he will be using the other pen as well or stopping it and just using victoza. Please advise

## 2015-05-09 NOTE — Telephone Encounter (Signed)
Tried to call pt to let him know that victoza has been sent and to continue with his current insulins as well. No answer and his VM was full.

## 2015-06-09 ENCOUNTER — Telehealth: Payer: Self-pay | Admitting: *Deleted

## 2015-06-09 DIAGNOSIS — R21 Rash and other nonspecific skin eruption: Secondary | ICD-10-CM

## 2015-06-09 MED ORDER — MUPIROCIN CALCIUM 2 % EX CREA: 1.0000 "application " | TOPICAL_CREAM | Freq: Two times a day (BID) | CUTANEOUS | Status: DC

## 2015-06-09 NOTE — Telephone Encounter (Signed)
Wife left msg on triage yesterday afternoon stating husband need refill on the Bactroban cream. Called pt verified pharmacy inform will send electronically...Raechel Chute/lmb

## 2015-08-09 ENCOUNTER — Encounter: Payer: Self-pay | Admitting: Family

## 2015-08-11 ENCOUNTER — Encounter: Payer: Self-pay | Admitting: Family

## 2015-08-11 DIAGNOSIS — Z794 Long term (current) use of insulin: Principal | ICD-10-CM

## 2015-08-11 DIAGNOSIS — E1165 Type 2 diabetes mellitus with hyperglycemia: Principal | ICD-10-CM

## 2015-08-11 DIAGNOSIS — IMO0001 Reserved for inherently not codable concepts without codable children: Secondary | ICD-10-CM

## 2015-08-13 ENCOUNTER — Other Ambulatory Visit: Payer: Self-pay | Admitting: Family

## 2015-08-16 ENCOUNTER — Encounter: Payer: Self-pay | Admitting: Family

## 2015-08-16 ENCOUNTER — Other Ambulatory Visit: Payer: Self-pay | Admitting: Family

## 2015-08-16 MED ORDER — INSULIN DEGLUDEC 100 UNIT/ML ~~LOC~~ SOPN: 30.0000 [IU] | PEN_INJECTOR | Freq: Every day | SUBCUTANEOUS | Status: DC

## 2015-08-18 ENCOUNTER — Other Ambulatory Visit: Payer: Self-pay

## 2015-08-18 MED ORDER — LIRAGLUTIDE 18 MG/3ML ~~LOC~~ SOPN: PEN_INJECTOR | SUBCUTANEOUS | Status: DC

## 2015-08-31 ENCOUNTER — Encounter: Payer: Self-pay | Admitting: Family

## 2015-09-03 ENCOUNTER — Encounter: Payer: Self-pay | Admitting: Family

## 2015-09-03 DIAGNOSIS — E1165 Type 2 diabetes mellitus with hyperglycemia: Principal | ICD-10-CM

## 2015-09-03 DIAGNOSIS — IMO0001 Reserved for inherently not codable concepts without codable children: Secondary | ICD-10-CM

## 2015-09-03 DIAGNOSIS — Z794 Long term (current) use of insulin: Principal | ICD-10-CM

## 2015-09-05 MED ORDER — INSULIN ASPART 100 UNIT/ML FLEXPEN: 0.0000 [IU] | PEN_INJECTOR | Freq: Three times a day (TID) | SUBCUTANEOUS | Status: DC

## 2015-10-07 ENCOUNTER — Other Ambulatory Visit: Payer: Self-pay | Admitting: Family

## 2015-10-18 ENCOUNTER — Other Ambulatory Visit: Payer: Self-pay

## 2015-10-28 ENCOUNTER — Other Ambulatory Visit: Payer: Self-pay | Admitting: Family

## 2015-11-09 ENCOUNTER — Ambulatory Visit (INDEPENDENT_AMBULATORY_CARE_PROVIDER_SITE_OTHER): Payer: BC Managed Care – PPO

## 2015-11-09 ENCOUNTER — Encounter: Payer: Self-pay | Admitting: Podiatry

## 2015-11-09 ENCOUNTER — Ambulatory Visit (INDEPENDENT_AMBULATORY_CARE_PROVIDER_SITE_OTHER): Payer: BC Managed Care – PPO | Admitting: Podiatry

## 2015-11-09 VITALS — BP 165/105 | HR 89 | Resp 16 | Ht 67.0 in | Wt 285.0 lb

## 2015-11-09 DIAGNOSIS — M79672 Pain in left foot: Secondary | ICD-10-CM | POA: Diagnosis not present

## 2015-11-09 DIAGNOSIS — M722 Plantar fascial fibromatosis: Secondary | ICD-10-CM | POA: Diagnosis not present

## 2015-11-09 MED ORDER — DICLOFENAC SODIUM 75 MG PO TBEC: 75.0000 mg | DELAYED_RELEASE_TABLET | Freq: Two times a day (BID) | ORAL | Status: DC

## 2015-11-09 MED ORDER — TRIAMCINOLONE ACETONIDE 10 MG/ML IJ SUSP
10.0000 mg | Freq: Once | INTRAMUSCULAR | Status: AC
  Administered 2015-11-09: 10 mg

## 2015-11-09 NOTE — Progress Notes (Signed)
   Subjective:    Patient ID: James [name], male    DOB: 03-07-1966, 50 y.o.   MRN: [id number]  HPI Chief Complaint  Patient presents with  . Foot Pain    Left foot; heel; x3 months; pt diabetic type 2; sugar=112 this am; A1C=9.0      Review of Systems  All other systems reviewed and are negative.      Objective:   Physical Exam        Assessment & Plan:

## 2015-11-09 NOTE — Patient Instructions (Signed)

## 2015-11-10 NOTE — Progress Notes (Signed)
Subjective:     Patient ID: James [name], male   DOB: [birth year], 50 y.o.   MRN: [id number]  HPI patient presents stating I had a three-month history of heel pain left that's been getting gradually worse and making walking difficult   Review of Systems  All other systems reviewed and are negative.      Objective:   Physical Exam  Constitutional: He is oriented to person, place, and time.  Cardiovascular: Intact distal pulses.   Musculoskeletal: Normal range of motion.  Neurological: He is oriented to person, place, and time.  Skin: Skin is warm.  Nursing note and vitals reviewed.  neurovascular status is within normal limits with muscle strength adequate and range of motion within normal limits. Patient's noted to have inflammation and pain in the plantar aspect of the left heel at the insertional point tendon into the calcaneus with fluid buildup around the medial band. Patient's found to have good digital perfusion is well oriented 3 with no equinus condition noted     Assessment:     Acute plantar fasciitis left    Plan:     Injected the left plantar fascia 3 mg Kenalog 5 mg Xylocaine and reviewed x-rays with patient. Dispense fascial brace with instructions on usage placed on oral anti-inflammatory agent and instructed on physical therapy. Reappoint to recheck again in 2 weeks  X-ray report indicates spur formation plantar and posterior heel with no indications of stress fracture arthritis

## 2015-11-12 ENCOUNTER — Other Ambulatory Visit: Payer: Self-pay | Admitting: Family

## 2015-11-16 ENCOUNTER — Ambulatory Visit: Payer: BC Managed Care – PPO | Admitting: Podiatry

## 2015-11-24 ENCOUNTER — Encounter: Payer: Self-pay | Admitting: Podiatry

## 2015-11-24 ENCOUNTER — Ambulatory Visit (INDEPENDENT_AMBULATORY_CARE_PROVIDER_SITE_OTHER): Payer: BC Managed Care – PPO | Admitting: Podiatry

## 2015-11-24 DIAGNOSIS — M722 Plantar fascial fibromatosis: Secondary | ICD-10-CM

## 2015-11-24 NOTE — Progress Notes (Signed)
Subjective:     Patient ID: James [name], male   DOB: [birth year], 50 y.o.   MRN: [id number]  HPI patient states I'm feeling quite a bit better but I am on my foot a lot at work and I do have to walk on cement floors and wear composite-type shoes   Review of Systems     Objective:   Physical Exam Neurovascular status intact muscle strength adequate range of motion within normal limits with patient noted to have mild discomfort in the plantar aspect of the left heel at the insertional point of the tendon into the calcaneus. Patient's found to have good digital perfusion at this time    Assessment:     Inflammatory fasciitis of the left heel insertional point    Plan:     H&P and condition reviewed and at this point I recommended long-term orthotics and scanned for custom orthotic devices. Patient will be seen back when those are ready

## 2015-12-21 ENCOUNTER — Ambulatory Visit: Payer: BC Managed Care – PPO | Admitting: *Deleted

## 2015-12-21 DIAGNOSIS — M722 Plantar fascial fibromatosis: Secondary | ICD-10-CM

## 2015-12-21 NOTE — Progress Notes (Signed)
Patient ID: James [name], male   DOB: 1965/10/02, 50 y.o.   MRN: [id number] Patient presents for orthotic pick up.  Verbal and written break in and wear instructions given.  Patient will follow up in 4 weeks if symptoms worsen or fail to improve.

## 2015-12-21 NOTE — Patient Instructions (Signed)

## 2016-01-07 ENCOUNTER — Other Ambulatory Visit: Payer: Self-pay | Admitting: Family

## 2016-02-02 ENCOUNTER — Other Ambulatory Visit: Payer: Self-pay | Admitting: Family

## 2016-02-23 ENCOUNTER — Other Ambulatory Visit: Payer: Self-pay | Admitting: Family

## 2016-02-25 ENCOUNTER — Other Ambulatory Visit: Payer: Self-pay | Admitting: Family

## 2016-03-27 ENCOUNTER — Encounter: Payer: BC Managed Care – PPO | Admitting: Family

## 2016-04-12 ENCOUNTER — Ambulatory Visit (INDEPENDENT_AMBULATORY_CARE_PROVIDER_SITE_OTHER): Payer: BC Managed Care – PPO | Admitting: Family

## 2016-04-12 VITALS — BP 142/100 | HR 67 | Temp 98.1°F | Resp 18 | Ht 67.0 in | Wt 295.0 lb

## 2016-04-12 DIAGNOSIS — E1165 Type 2 diabetes mellitus with hyperglycemia: Secondary | ICD-10-CM | POA: Diagnosis not present

## 2016-04-12 DIAGNOSIS — IMO0001 Reserved for inherently not codable concepts without codable children: Secondary | ICD-10-CM

## 2016-04-12 DIAGNOSIS — Z23 Encounter for immunization: Secondary | ICD-10-CM

## 2016-04-12 DIAGNOSIS — Z0000 Encounter for general adult medical examination without abnormal findings: Secondary | ICD-10-CM | POA: Diagnosis not present

## 2016-04-12 DIAGNOSIS — Z794 Long term (current) use of insulin: Secondary | ICD-10-CM

## 2016-04-12 MED ORDER — METFORMIN HCL 500 MG PO TABS: ORAL_TABLET | ORAL | 0 refills | Status: DC

## 2016-04-12 MED ORDER — INSULIN ASPART 100 UNIT/ML FLEXPEN: PEN_INJECTOR | SUBCUTANEOUS | 0 refills | Status: DC

## 2016-04-12 MED ORDER — SIMVASTATIN 20 MG PO TABS: ORAL_TABLET | ORAL | 0 refills | Status: DC

## 2016-04-12 MED ORDER — LISINOPRIL-HYDROCHLOROTHIAZIDE 20-25 MG PO TABS: ORAL_TABLET | ORAL | 0 refills | Status: DC

## 2016-04-12 MED ORDER — INSULIN DEGLUDEC 100 UNIT/ML ~~LOC~~ SOPN: 30.0000 [IU] | PEN_INJECTOR | Freq: Every day | SUBCUTANEOUS | 0 refills | Status: DC

## 2016-04-12 NOTE — Patient Instructions (Addendum)
Thank you for choosing [facility].  SUMMARY AND INSTRUCTIONS:  They will call to schedule your diabetic education.   Medication:  Please restart taking your medications.   Your prescription(s) have been submitted to your pharmacy or been printed and provided for you. Please take as directed and contact our office if you believe you are having problem(s) with the medication(s) or have any questions.  Labs:  Please stop by the lab on the lower level of the building for your blood work. Your results will be released to MyChart (or called to you) after review, usually within 72 hours after test completion. If any changes need to be made, you will be notified at that same time.  1.) The lab is open from 7:30am to 5:30 pm Monday-Friday 2.) No appointment is necessary 3.) Fasting (if needed) is 6-8 hours after food and drink; black coffee and water are okay    Follow up:  If your symptoms worsen or fail to improve, please contact our office for further instruction, or in case of emergency go directly to the emergency room at the closest medical facility.   Health Maintenance, Male A healthy lifestyle and preventative care can promote health and wellness.  Maintain regular health, dental, and eye exams.  Eat a healthy diet. Foods like vegetables, fruits, whole grains, low-fat dairy products, and lean protein foods contain the nutrients you need and are low in calories. Decrease your intake of foods high in solid fats, added sugars, and salt. Get information about a proper diet from your health care provider, if necessary.  Regular physical exercise is one of the most important things you can do for your health. Most adults should get at least 150 minutes of moderate-intensity exercise (any activity that increases your heart rate and causes you to sweat) each week. In addition, most adults need muscle-strengthening exercises on 2 or more days a week.   Maintain a healthy weight. The  body mass index (BMI) is a screening tool to identify possible weight problems. It provides an estimate of body fat based on height and weight. Your health care provider can find your BMI and can help you achieve or maintain a healthy weight. For males 20 years and older:  A BMI below 18.5 is considered underweight.  A BMI of 18.5 to 24.9 is normal.  A BMI of 25 to 29.9 is considered overweight.  A BMI of 30 and above is considered obese.  Maintain normal blood lipids and cholesterol by exercising and minimizing your intake of saturated fat. Eat a balanced diet with plenty of fruits and vegetables. Blood tests for lipids and cholesterol should begin at age 48 and be repeated every 5 years. If your lipid or cholesterol levels are high, you are over age 48, or you are at high risk for heart disease, you may need your cholesterol levels checked more frequently.Ongoing high lipid and cholesterol levels should be treated with medicines if diet and exercise are not working.  If you smoke, find out from your health care provider how to quit. If you do not use tobacco, do not start.  Lung cancer screening is recommended for adults aged 55-80 years who are at high risk for developing lung cancer because of a history of smoking. A yearly low-dose CT scan of the lungs is recommended for people who have at least a 30-pack-year history of smoking and are current smokers or have quit within the past 15 years. A pack year of smoking is smoking  an average of 1 pack of cigarettes a day for 1 year (for example, a 30-pack-year history of smoking could mean smoking 1 pack a day for 30 years or 2 packs a day for 15 years). Yearly screening should continue until the smoker has stopped smoking for at least 15 years. Yearly screening should be stopped for people who develop a health problem that would prevent them from having lung cancer treatment.  If you choose to drink alcohol, do not have more than 2 drinks per day.  One drink is considered to be 12 oz (360 mL) of beer, 5 oz (150 mL) of wine, or 1.5 oz (45 mL) of liquor.  Avoid the use of street drugs. Do not share needles with anyone. Ask for help if you need support or instructions about stopping the use of drugs.  High blood pressure causes heart disease and increases the risk of stroke. High blood pressure is more likely to develop in:  People who have blood pressure in the end of the normal range (100-139/85-89 mm Hg).  People who are overweight or obese.  People who are African American.  If you are 54-81 years of age, have your blood pressure checked every 3-5 years. If you are 58 years of age or older, have your blood pressure checked every year. You should have your blood pressure measured twice--once when you are at a hospital or clinic, and once when you are not at a hospital or clinic. Record the average of the two measurements. To check your blood pressure when you are not at a hospital or clinic, you can use:  An automated blood pressure machine at a pharmacy.  A home blood pressure monitor.  If you are 26-58 years old, ask your health care provider if you should take aspirin to prevent heart disease.  Diabetes screening involves taking a blood sample to check your fasting blood sugar level. This should be done once every 3 years after age 101 if you are at a normal weight and without risk factors for diabetes. Testing should be considered at a younger age or be carried out more frequently if you are overweight and have at least 1 risk factor for diabetes.  Colorectal cancer can be detected and often prevented. Most routine colorectal cancer screening begins at the age of 108 and continues through age 69. However, your health care provider may recommend screening at an earlier age if you have risk factors for colon cancer. On a yearly basis, your health care provider may provide home test kits to check for hidden blood in the stool. A small  camera at the end of a tube may be used to directly examine the colon (sigmoidoscopy or colonoscopy) to detect the earliest forms of colorectal cancer. Talk to your health care provider about this at age 69 when routine screening begins. A direct exam of the colon should be repeated every 5-10 years through age 5, unless early forms of precancerous polyps or small growths are found.  People who are at an increased risk for hepatitis B should be screened for this virus. You are considered at high risk for hepatitis B if:  You were born in a country where hepatitis B occurs often. Talk with your health care provider about which countries are considered high risk.  Your parents were born in a high-risk country and you have not received a shot to protect against hepatitis B (hepatitis B vaccine).  You have HIV or AIDS.  You use  needles to inject street drugs.  You live with, or have sex with, someone who has hepatitis B.  You are a man who has sex with other men (MSM).  You get hemodialysis treatment.  You take certain medicines for conditions like cancer, organ transplantation, and autoimmune conditions.  Hepatitis C blood testing is recommended for all people born from [birth year] through 1963 and any individual with known risk factors for hepatitis C.  Healthy men should no longer receive prostate-specific antigen (PSA) blood tests as part of routine cancer screening. Talk to your health care provider about prostate cancer screening.  Testicular cancer screening is not recommended for adolescents or adult males who have no symptoms. Screening includes self-exam, a health care provider exam, and other screening tests. Consult with your health care provider about any symptoms you have or any concerns you have about testicular cancer.  Practice safe sex. Use condoms and avoid high-risk sexual practices to reduce the spread of sexually transmitted infections (STIs).  You should be screened for STIs,  including gonorrhea and chlamydia if:  You are sexually active and are younger than 24 years.  You are older than 24 years, and your health care provider tells you that you are at risk for this type of infection.  Your sexual activity has changed since you were last screened, and you are at an increased risk for chlamydia or gonorrhea. Ask your health care provider if you are at risk.  If you are at risk of being infected with HIV, it is recommended that you take a prescription medicine daily to prevent HIV infection. This is called pre-exposure prophylaxis (PrEP). You are considered at risk if:  You are a man who has sex with other men (MSM).  You are a heterosexual man who is sexually active with multiple partners.  You take drugs by injection.  You are sexually active with a partner who has HIV.  Talk with your health care provider about whether you are at high risk of being infected with HIV. If you choose to begin PrEP, you should first be tested for HIV. You should then be tested every 3 months for as long as you are taking PrEP.  Use sunscreen. Apply sunscreen liberally and repeatedly throughout the day. You should seek shade when your shadow is shorter than you. Protect yourself by wearing long sleeves, pants, a wide-brimmed hat, and sunglasses year round whenever you are outdoors.  Tell your health care provider of new moles or changes in moles, especially if there is a change in shape or color. Also, tell your health care provider if a mole is larger than the size of a pencil eraser.  A one-time screening for abdominal aortic aneurysm (AAA) and surgical repair of large AAAs by ultrasound is recommended for men aged 65-75 years who are current or former smokers.  Stay current with your vaccines (immunizations).   This information is not intended to replace advice given to you by your health care provider. Make sure you discuss any questions you have with your health care  provider.   Document Released: 12/15/2007 Document Revised: 07/09/2014 Document Reviewed: 11/13/2010 Elsevier Interactive Patient Education [employer]

## 2016-04-12 NOTE — Progress Notes (Signed)
Subjective:    Patient ID: James [name], male    DOB: [birth year], 50 y.o.   MRN: [id number]  Chief Complaint  Patient presents with  . CPE    not fasting, any rx refills he wants printed he is thinking about changing pharmacy, referral to dietition, wants to lose weight     HPI:  James [name] is a 50 y.o. male who presents today for an annual wellness visit.   1) Health Maintenance -   Diet - Averaging about 3 meals per day consisting of a regular diet. Caffeine intake of about 1-2 cups per day  Exercise - Limited exercise secondary to a spur on his heel. Now moving around more. Walks about 12-15,000 steps per day.   2) Preventative Exams / Immunizations:  Dental -- Due for exam  Vision -- Up to date   Health Maintenance  Topic Date Due  . PNEUMOCOCCAL POLYSACCHARIDE VACCINE (1) 10/23/1969  . FOOT EXAM  10/23/1977  . HIV Screening  10/24/1982  . HEMOGLOBIN A1C  09/20/2015  . OPHTHALMOLOGY EXAM  07/10/2016  . TETANUS/TDAP  03/22/2025  . INFLUENZA VACCINE  Completed    Immunization History  Administered Date(s) Administered  . Influenza,inj,Quad PF,36+ Mos 03/23/2015  . Td 03/23/2015     Allergies  Allergen Reactions  . Ceftriaxone     Other reaction(s): Other (See Comments) leads to increased photosensitivity, per hospital ([facility]) 02/08/2009     Outpatient Medications Prior to Visit  Medication Sig Dispense Refill  . diclofenac (VOLTAREN) 75 MG EC tablet Take 1 tablet (75 mg total) by mouth 2 (two) times daily. 50 tablet 2  . ONE TOUCH ULTRA TEST test strip CHECK BLOOD SUGAR 4 TIMES DAILY. 100 each 2  . insulin aspart (NOVOLOG FLEXPEN) 100 UNIT/ML FlexPen Inject 0-30 units SQ 3 times a day with meals. 15 mL 0  . Insulin Degludec (TRESIBA FLEXTOUCH) 100 UNIT/ML SOPN Inject 30 Units as directed daily. 15 mL 0  . insulin detemir (LEVEMIR) 100 UNIT/ML injection Inject 30 Units into the skin at bedtime.     . Liraglutide (VICTOZA) 18 MG/3ML SOPN Inject  0.6 mg daily subcutaneously for 1 week and then increase to 1.2 mg daily subcutaneously. 6 mL 3  . lisinopril-hydrochlorothiazide (PRINZIDE,ZESTORETIC) 20-25 MG tablet TAKE (1) TABLET BY MOUTH DAILY FOR HIGH BLOOD PRESSURE. 30 tablet 0  . metFORMIN (GLUCOPHAGE) 500 MG tablet TAKE 1 TABLET BY MOUTH TWICE DAILY FOR DIABETES 60 tablet 0  . simvastatin (ZOCOR) 20 MG tablet TAKE 1 TABLET BY MOUTH AT BEDTIME FOR CHOLESTEROL 30 tablet 0   No facility-administered medications prior to visit.      Past Medical History:  Diagnosis Date  . Chicken pox   . Diabetes mellitus without complication (HCC)   . Hyperlipidemia   . Hypertension   . Kidney stones   . Sleep apnea    CiPaP at night     Past Surgical History:  Procedure Laterality Date  . CYSTOSCOPY W/ URETERAL STENT PLACEMENT  08/07/2011   Procedure: CYSTOSCOPY WITH RETROGRADE PYELOGRAM/URETERAL STENT PLACEMENT;  Surgeon: Ky Barban, MD;  Location: [facility];  Service: Urology;  Laterality: Left;  Cystoscopy, Left Retrograde Pyelogram, Left Ureteral Ballon Dilation, Double J Stent Placement  . SHOULDER SURGERY     bilateral     Family History  Problem Relation Age of Onset  . Anesthesia problems Neg Hx   . Hypotension Neg Hx   . Malignant hyperthermia Neg Hx   . Pseudochol  deficiency Neg Hx   . Prostate cancer Father   . Diabetes Father      Social History   Social History  . Marital status: Married    Spouse name: N/A  . Number of children: 2  . Years of education: 14   Occupational History  . Maintenance Mechanic    Social History Main Topics  . Smoking status: Never Smoker  . Smokeless tobacco: Never Used  . Alcohol use No  . Drug use: No  . Sexual activity: Not on file   Other Topics Concern  . Not on file   Social History Narrative   Fun: Works in his shop and work on [employer], farm   Denies religious beliefs effecting health care.     Review of Systems  Constitutional: Denies fever, chills,  fatigue, or significant weight gain/loss. HENT: Head: Denies headache or neck pain Ears: Denies changes in hearing, ringing in ears, earache, drainage Nose: Denies discharge, stuffiness, itching, nosebleed, sinus pain Throat: Denies sore throat, hoarseness, dry mouth, sores, thrush Eyes: Denies loss/changes in vision, pain, redness, blurry/double vision, flashing lights Cardiovascular: Denies chest pain/discomfort, tightness, palpitations, shortness of breath with activity, difficulty lying down, swelling, sudden awakening with shortness of breath Respiratory: Denies shortness of breath, cough, sputum production, wheezing Gastrointestinal: Denies dysphasia, heartburn, change in appetite, nausea, change in bowel habits, rectal bleeding, constipation, diarrhea, yellow skin or eyes Genitourinary: Denies frequency, urgency, burning/pain, blood in urine, incontinence, change in urinary strength. Musculoskeletal: Denies muscle/joint pain, stiffness, back pain, redness or swelling of joints, trauma Skin: Denies rashes, lumps, itching, dryness, color changes, or hair/nail changes Neurological: Denies dizziness, fainting, seizures, weakness, numbness, tingling, tremor Psychiatric - Denies nervousness, stress, depression or memory loss Endocrine: Denies heat or cold intolerance, sweating, frequent urination, excessive thirst, changes in appetite Hematologic: Denies ease of bruising or bleeding     Objective:     BP (!) 142/100 (BP Location: Left Arm, Patient Position: Sitting, Cuff Size: Large)   Pulse 67   Temp 98.1 F (36.7 C) (Oral)   Resp 18   Ht 5\' 7"  (1.702 m)   Wt 295 lb (133.8 kg)   SpO2 98%   BMI 46.20 kg/m  Nursing note and vital signs reviewed.  Physical Exam  Constitutional: He is oriented to person, place, and time. He appears well-developed and well-nourished.  HENT:  Head: Normocephalic.  Right Ear: Hearing, tympanic membrane, external ear and ear canal normal.  Left Ear:  Hearing, tympanic membrane, external ear and ear canal normal.  Nose: Nose normal.  Mouth/Throat: Uvula is midline, oropharynx is clear and moist and mucous membranes are normal.  Eyes: Conjunctivae and EOM are normal. Pupils are equal, round, and reactive to light.  Neck: Neck supple. No JVD present. No tracheal deviation present. No thyromegaly present.  Cardiovascular: Normal rate, regular rhythm, normal heart sounds and intact distal pulses.   Pulmonary/Chest: Effort normal and breath sounds normal.  Abdominal: Soft. Bowel sounds are normal. He exhibits no distension and no mass. There is no tenderness. There is no rebound and no guarding.  Musculoskeletal: Normal range of motion. He exhibits no edema or tenderness.  Lymphadenopathy:    He has no cervical adenopathy.  Neurological: He is alert and oriented to person, place, and time. He has normal reflexes. No cranial nerve deficit. He exhibits normal muscle tone. Coordination normal.  Skin: Skin is warm and dry.  Psychiatric: He has a normal mood and affect. His behavior is normal. Judgment and thought  content normal.       Assessment & Plan:   Problem List Items Addressed This Visit      Endocrine   Type 2 diabetes mellitus, uncontrolled (HCC)   Relevant Medications   lisinopril-hydrochlorothiazide (PRINZIDE,ZESTORETIC) 20-25 MG tablet   metFORMIN (GLUCOPHAGE) 500 MG tablet   insulin degludec (TRESIBA FLEXTOUCH) 100 UNIT/ML SOPN FlexTouch Pen   insulin aspart (NOVOLOG FLEXPEN) 100 UNIT/ML FlexPen   simvastatin (ZOCOR) 20 MG tablet   Other Relevant Orders   Urine Microalbumin w/creat. ratio     Other   Routine general medical examination at a health care facility - Primary    1) Anticipatory Guidance: Discussed importance of wearing a seatbelt while driving and not texting while driving; changing batteries in smoke detector at least once annually; wearing suntan lotion when outside; eating a balanced and moderate diet; getting  physical activity at least 30 minutes per day.  2) Immunizations / Screenings / Labs:  Pneumococcal vaccination updated today. All other immunizations are up-to-date per recommendations. Due for dental exam encouraged to be completed independently. Due for hemoglobin A1c and urine microalbumin for diabetes screenings. Diabetic foot exam completed today. All other screenings are up-to-date per recommendations. Obtain CBC, CMET, and lipid profile.   Overall adequate exam with risk factors for cardiovascular disease including obesity, type 2 diabetes, sleep apnea, and essential hypertension. Not currently taking medications for chronic conditions as he has been out of them. Encouraged importance of taking medications as prescribed to reduce risk for cardiovascular and chronic diseases in the future. BMI of 46 with recommended weight loss of 5-10% of current body weight through nutrition and physical activity. Continue healthy lifestyle behaviors and choices in progress. Follow-up prevention exam in 1 year. Follow-up office visit in 3 months or sooner pending blood work results.       Relevant Orders   CBC   Comprehensive metabolic panel   Lipid panel   Hemoglobin A1c   Morbid obesity (HCC)    BMI of 46.2 indicating severe obesity.Recommend weight loss of 5-10% of current body weight. Recommend increasing physical activity to 30 minutes of moderate level activity daily. Encourage nutritional intake that focuses on nutrient dense foods and is moderate, varied, and balanced and is low in saturated fats and processed/sugary foods. Continue to monitor.       Relevant Medications   metFORMIN (GLUCOPHAGE) 500 MG tablet   insulin degludec (TRESIBA FLEXTOUCH) 100 UNIT/ML SOPN FlexTouch Pen   insulin aspart (NOVOLOG FLEXPEN) 100 UNIT/ML FlexPen    Other Visit Diagnoses   None.      I have discontinued Mr. Larmer insulin detemir and liraglutide. I have also changed his insulin degludec.  Additionally, I am having him maintain his diclofenac, ONE TOUCH ULTRA TEST, lisinopril-hydrochlorothiazide, metFORMIN, insulin aspart, and simvastatin.   Meds ordered this encounter  Medications  . lisinopril-hydrochlorothiazide (PRINZIDE,ZESTORETIC) 20-25 MG tablet    Sig: TAKE (1) TABLET BY MOUTH DAILY FOR HIGH BLOOD PRESSURE.    Dispense:  90 tablet    Refill:  0    Supervising Provider- Dr. Hillard Danker  . metFORMIN (GLUCOPHAGE) 500 MG tablet    Sig: TAKE 1 TABLET BY MOUTH TWICE DAILY FOR DIABETES    Dispense:  180 tablet    Refill:  0    Supervising Provider- Dr. Hillard Danker  . insulin degludec (TRESIBA FLEXTOUCH) 100 UNIT/ML SOPN FlexTouch Pen    Sig: Inject 0.3 mLs (30 Units total) into the skin daily.    Dispense:  27  mL    Refill:  0    Supervising Provider- Dr. Hillard Danker  . insulin aspart (NOVOLOG FLEXPEN) 100 UNIT/ML FlexPen    Sig: Inject 0-30 units SQ 3 times a day with meals.    Dispense:  27 mL    Refill:  0    Supervising Provider- Dr. Hillard Danker  . simvastatin (ZOCOR) 20 MG tablet    Sig: TAKE 1 TABLET BY MOUTH AT BEDTIME FOR CHOLESTEROL    Dispense:  90 tablet    Refill:  0    Supervising Provider- Dr. Hillard Danker     Follow-up: Return in about 3 months (around 07/13/2016), or if symptoms worsen or fail to improve.   Jeanine Luz, FNP

## 2016-04-12 NOTE — Assessment & Plan Note (Signed)
BMI of 46.2 indicating severe obesity.Recommend weight loss of 5-10% of current body weight. Recommend increasing physical activity to 30 minutes of moderate level activity daily. Encourage nutritional intake that focuses on nutrient dense foods and is moderate, varied, and balanced and is low in saturated fats and processed/sugary foods. Continue to monitor.

## 2016-04-12 NOTE — Assessment & Plan Note (Addendum)
1) Anticipatory Guidance: Discussed importance of wearing a seatbelt while driving and not texting while driving; changing batteries in smoke detector at least once annually; wearing suntan lotion when outside; eating a balanced and moderate diet; getting physical activity at least 30 minutes per day.  2) Immunizations / Screenings / Labs:  Pneumococcal vaccination updated today. All other immunizations are up-to-date per recommendations. Due for dental exam encouraged to be completed independently. Due for hemoglobin A1c and urine microalbumin for diabetes screenings. Diabetic foot exam completed today. All other screenings are up-to-date per recommendations. Obtain CBC, CMET, and lipid profile.   Overall adequate exam with risk factors for cardiovascular disease including obesity, type 2 diabetes, sleep apnea, and essential hypertension. Not currently taking medications for chronic conditions as he has been out of them. Encouraged importance of taking medications as prescribed to reduce risk for cardiovascular and chronic diseases in the future. BMI of 46 with recommended weight loss of 5-10% of current body weight through nutrition and physical activity. Continue healthy lifestyle behaviors and choices in progress. Follow-up prevention exam in 1 year. Follow-up office visit in 3 months or sooner pending blood work results.

## 2016-04-12 NOTE — Addendum Note (Signed)
Addended by: Jeanine LuzALONE, GREGORY D on: 04/12/2016 10:14 PM   Modules accepted: Orders

## 2016-04-13 NOTE — Addendum Note (Signed)
Addended by: Mercer PodWRENN, Bart Ashford E on: 04/13/2016 08:00 AM   Modules accepted: Orders

## 2016-04-23 ENCOUNTER — Encounter: Payer: Self-pay | Admitting: Family

## 2016-05-07 ENCOUNTER — Encounter: Payer: Self-pay | Admitting: *Deleted

## 2016-05-07 ENCOUNTER — Encounter: Payer: BC Managed Care – PPO | Attending: Family | Admitting: *Deleted

## 2016-05-07 DIAGNOSIS — E1165 Type 2 diabetes mellitus with hyperglycemia: Secondary | ICD-10-CM | POA: Insufficient documentation

## 2016-05-07 DIAGNOSIS — Z794 Long term (current) use of insulin: Secondary | ICD-10-CM | POA: Insufficient documentation

## 2016-05-07 DIAGNOSIS — IMO0001 Reserved for inherently not codable concepts without codable children: Secondary | ICD-10-CM

## 2016-05-07 DIAGNOSIS — Z713 Dietary counseling and surveillance: Secondary | ICD-10-CM | POA: Insufficient documentation

## 2016-05-07 NOTE — Progress Notes (Signed)
Diabetes Self-Management Education  Visit Type: First/Initial  Appt. Start Time: 0830 Appt. End Time: 0930  05/07/2016  Mr. James StarrEdmund Fero, identified by name and date of birth, is a 50 y.o. male with a diagnosis of Diabetes: Type 2.   ASSESSMENT       Diabetes Self-Management Education - 05/07/16 0830      Visit Information   Visit Type First/Initial     Initial Visit   Diabetes Type Type 2   Are you taking your medications as prescribed? No   Date Diagnosed 2 years ago     Health Coping   How would you rate your overall health? Good     Psychosocial Assessment   Patient Belief/Attitude about Diabetes Motivated to manage diabetes   Self-care barriers None   Self-management support Family   Other persons present Patient   Patient Concerns Nutrition/Meal planning;Healthy Lifestyle;Weight Control   Special Needs None   Preferred Learning Style No preference indicated   Learning Readiness Ready   How often do you need to have someone help you when you read instructions, pamphlets, or other written materials from your doctor or pharmacy? 1 - Never   What is the last grade level you completed in school? college     Pre-Education Assessment   Patient understands the diabetes disease and treatment process. Needs Instruction   Patient understands incorporating nutritional management into lifestyle. Needs Instruction   Patient undertands incorporating physical activity into lifestyle. Needs Instruction   Patient understands using medications safely. Needs Instruction   Patient understands monitoring blood glucose, interpreting and using results Needs Instruction   Patient understands prevention, detection, and treatment of acute complications. Needs Instruction   Patient understands prevention, detection, and treatment of chronic complications. Needs Instruction   Patient understands how to develop strategies to address psychosocial issues. Needs Instruction   Patient  understands how to develop strategies to promote health/change behavior. Needs Instruction     Complications   How often do you check your blood sugar? 3-4 times / week   Fasting Blood glucose range (mg/dL) 16-109;604-540;[id number];130-179;180-200   Postprandial Blood glucose range (mg/dL) >478;295-621>200;180-200   Number of hypoglycemic episodes per month 1   Can you tell when your blood sugar is low? Yes   What do you do if your blood sugar is low? eat some sugar of some sort (soda, candy, honey)   Number of hyperglycemic episodes per week 0   Have you had a dilated eye exam in the past 12 months? Yes   Have you had a dental exam in the past 12 months? No   Are you checking your feet? Yes   How many days per week are you checking your feet? 7     Dietary Intake   Breakfast shredded wheat   Lunch "a bunch of country food" green beans, ham biscuits, BBQ, apple pie, corn pudding   Dinner shrimp and crab ravioli    Beverage(s) lemonade, hot chocolate, water     Exercise   Exercise Type Light (walking / raking leaves)   How many days per week to you exercise? 5   How many minutes per day do you exercise? 90   Total minutes per week of exercise 450     Patient Education   Previous Diabetes Education No   Disease state  Definition of diabetes, type 1 and 2, and the diagnosis of diabetes;Factors that contribute to the development of diabetes;Explored patient's options for treatment of their diabetes   Nutrition  management  Role of diet in the treatment of diabetes and the relationship between the three main macronutrients and blood glucose level;Information on hints to eating out and maintain blood glucose control.;Meal options for control of blood glucose level and chronic complications.   Physical activity and exercise  Role of exercise on diabetes management, blood pressure control and cardiac health.   Monitoring Taught/discussed recording of test results and interpretation of SMBG.;Yearly dilated eye exam;Daily  foot exams   Acute complications Taught treatment of hypoglycemia - the 15 rule.;Discussed and identified patients' treatment of hyperglycemia.;Covered sick day management with medication and food.   Chronic complications Relationship between chronic complications and blood glucose control   Psychosocial adjustment Role of stress on diabetes;Travel strategies     Individualized Goals (developed by patient)   Nutrition Follow meal plan discussed;General guidelines for healthy choices and portions discussed   Physical Activity Exercise 3-5 times per week   Medications take my medication as prescribed   Monitoring  test my blood glucose as discussed     Post-Education Assessment   Patient understands the diabetes disease and treatment process. Needs Review   Patient understands incorporating nutritional management into lifestyle. Needs Review   Patient undertands incorporating physical activity into lifestyle. Needs Review   Patient understands using medications safely. Needs Review   Patient understands monitoring blood glucose, interpreting and using results Needs Review   Patient understands prevention, detection, and treatment of acute complications. Needs Review   Patient understands prevention, detection, and treatment of chronic complications. Needs Review   Patient understands how to develop strategies to address psychosocial issues. Needs Review   Patient understands how to develop strategies to promote health/change behavior. Needs Review     Outcomes   Expected Outcomes Demonstrated interest in learning. Expect positive outcomes   Future DMSE 4-6 wks   Program Status Completed      Individualized Plan for Diabetes Self-Management Training:   Learning Objective:  Patient will have a greater understanding of diabetes self-management. Patient education plan is to attend individual and/or group sessions per assessed needs and concerns.   Plan:   There are no Patient  Instructions on file for this visit.  Expected Outcomes:  Demonstrated interest in learning. Expect positive outcomes  Education material provided: Living Well with Diabetes and My Plate  If problems or questions, patient to contact team via:  Phone and Email  Future DSME appointment: 6-8 wks

## 2016-06-08 ENCOUNTER — Encounter: Payer: Self-pay | Admitting: Nurse Practitioner

## 2016-06-08 ENCOUNTER — Ambulatory Visit (INDEPENDENT_AMBULATORY_CARE_PROVIDER_SITE_OTHER): Payer: BC Managed Care – PPO | Admitting: Nurse Practitioner

## 2016-06-08 ENCOUNTER — Other Ambulatory Visit (INDEPENDENT_AMBULATORY_CARE_PROVIDER_SITE_OTHER): Payer: BC Managed Care – PPO

## 2016-06-08 VITALS — BP 160/110 | HR 83 | Temp 98.2°F | Ht 67.0 in | Wt 299.0 lb

## 2016-06-08 DIAGNOSIS — Z794 Long term (current) use of insulin: Secondary | ICD-10-CM

## 2016-06-08 DIAGNOSIS — I10 Essential (primary) hypertension: Secondary | ICD-10-CM | POA: Diagnosis not present

## 2016-06-08 DIAGNOSIS — W57XXXA Bitten or stung by nonvenomous insect and other nonvenomous arthropods, initial encounter: Secondary | ICD-10-CM | POA: Diagnosis not present

## 2016-06-08 DIAGNOSIS — E1165 Type 2 diabetes mellitus with hyperglycemia: Secondary | ICD-10-CM

## 2016-06-08 DIAGNOSIS — IMO0001 Reserved for inherently not codable concepts without codable children: Secondary | ICD-10-CM

## 2016-06-08 DIAGNOSIS — Z0000 Encounter for general adult medical examination without abnormal findings: Secondary | ICD-10-CM

## 2016-06-08 DIAGNOSIS — S40261A Insect bite (nonvenomous) of right shoulder, initial encounter: Secondary | ICD-10-CM

## 2016-06-08 LAB — MICROALBUMIN / CREATININE URINE RATIO
CREATININE, U: 215.5 mg/dL
MICROALB/CREAT RATIO: 22.5 mg/g (ref 0.0–30.0)
Microalb, Ur: 48.5 mg/dL — ABNORMAL HIGH (ref 0.0–1.9)

## 2016-06-08 LAB — LDL CHOLESTEROL, DIRECT: Direct LDL: 234 mg/dL

## 2016-06-08 LAB — COMPREHENSIVE METABOLIC PANEL
ALBUMIN: 4.5 g/dL (ref 3.5–5.2)
ALT: 33 U/L (ref 0–53)
AST: 16 U/L (ref 0–37)
Alkaline Phosphatase: 55 U/L (ref 39–117)
BILIRUBIN TOTAL: 0.7 mg/dL (ref 0.2–1.2)
BUN: 20 mg/dL (ref 6–23)
CALCIUM: 10.3 mg/dL (ref 8.4–10.5)
CHLORIDE: 95 meq/L — AB (ref 96–112)
CO2: 36 meq/L — AB (ref 19–32)
Creatinine, Ser: 1.16 mg/dL (ref 0.40–1.50)
GFR: 71.24 mL/min (ref >60.00)
Glucose, Bld: 225 mg/dL — ABNORMAL HIGH (ref 70–99)
Potassium: 4.5 mEq/L (ref 3.5–5.1)
Sodium: 138 mEq/L (ref 135–145)
Total Protein: 7.7 g/dL (ref 6.0–8.3)

## 2016-06-08 LAB — CBC
HCT: 44.9 % (ref 39.0–52.0)
HEMOGLOBIN: 15.4 g/dL (ref 13.0–17.0)
MCHC: 34.3 g/dL (ref 30.0–36.0)
MCV: 86 fl (ref 78.0–100.0)
PLATELETS: 385 10*3/uL (ref 150.0–400.0)
RBC: 5.22 Mil/uL (ref 4.22–5.81)
RDW: 15.1 % (ref 11.5–15.5)
WBC: 8.6 10*3/uL (ref 4.0–10.5)

## 2016-06-08 LAB — HEMOGLOBIN A1C: HEMOGLOBIN A1C: 8.8 % — AB (ref 4.6–6.5)

## 2016-06-08 LAB — LIPID PANEL
CHOL/HDL RATIO: 16
CHOLESTEROL: 298 mg/dL — AB (ref 0–200)
HDL: 19.1 mg/dL — ABNORMAL LOW (ref >39.00)
NonHDL: 279.16
TRIGLYCERIDES: 352 mg/dL — AB (ref 0.0–149.0)
VLDL: 70.4 mg/dL — AB (ref 0.0–40.0)

## 2016-06-08 MED ORDER — AMLODIPINE BESYLATE 5 MG PO TABS: 5.0000 mg | ORAL_TABLET | Freq: Every day | ORAL | 3 refills | Status: DC

## 2016-06-08 MED ORDER — DOXYCYCLINE HYCLATE 100 MG PO TABS: 100.0000 mg | ORAL_TABLET | Freq: Two times a day (BID) | ORAL | 0 refills | Status: DC

## 2016-06-08 MED ORDER — GLUCOSE BLOOD VI STRP: 1.0000 | ORAL_STRIP | Freq: Four times a day (QID) | 3 refills | Status: DC

## 2016-06-08 NOTE — Progress Notes (Signed)
Pre visit review using our clinic review tool, if applicable. No additional management support is needed unless otherwise documented below in the visit note. 

## 2016-06-08 NOTE — Patient Instructions (Signed)
Avoid any NSAIDs. Continue blood pressure checks and record.   Hypertension Hypertension is another name for high blood pressure. High blood pressure forces your heart to work harder to pump blood. A blood pressure reading has two numbers, which includes a higher number over a lower number (example: 110/72). Follow these instructions at home:  Have your blood pressure rechecked by your doctor.  Only take medicine as told by your doctor. Follow the directions carefully. The medicine does not work as well if you skip doses. Skipping doses also puts you at risk for problems.  Do not smoke.  Monitor your blood pressure at home as told by your doctor. Contact a doctor if:  You think you are having a reaction to the medicine you are taking.  You have repeat headaches or feel dizzy.  You have puffiness (swelling) in your ankles.  You have trouble with your vision. Get help right away if:  You get a very bad headache and are confused.  You feel weak, numb, or faint.  You get chest or belly (abdominal) pain.  You throw up (vomit).  You cannot breathe very well. This information is not intended to replace advice given to you by your health care provider. Make sure you discuss any questions you have with your health care provider. Document Released: 12/05/2007 Document Revised: 11/24/2015 Document Reviewed: 04/10/2013 Elsevier Interactive Patient Education  2017 [employer]

## 2016-06-08 NOTE — Progress Notes (Signed)
Subjective:  Patient ID: James [name], male    DOB: December 25, 1965  Age: 50 y.o. MRN: [id number]  CC: Insect Bite (tick bite 4 days ago,bodyache,lethargy. had big ring around it when pull tick out. )   Hypertension  This is a chronic problem. The current episode started more than 1 year ago. The problem has been waxing and waning since onset. The problem is uncontrolled. Associated symptoms include headaches and malaise/fatigue. Pertinent negatives include no anxiety, blurred vision, chest pain, neck pain, orthopnea, palpitations, peripheral edema, PND or shortness of breath. Agents associated with hypertension include NSAIDs. Risk factors for coronary artery disease include male gender, obesity, stress, sedentary lifestyle and family history. Past treatments include ACE inhibitors and diuretics. The current treatment provides no improvement. Compliance problems include diet.  Identifiable causes of hypertension include sleep apnea.  has also been using mucinex D and NSAIDs in past 1week.  Insect bite: Removed tick from right shoulder 3days ago. He thinks tick was embedded for at least 3days. Denies any fever, nor joint pain or swelling.  He also needs glucose test strips.  Outpatient Medications Prior to Visit  Medication Sig Dispense Refill  . diclofenac (VOLTAREN) 75 MG EC tablet Take 1 tablet (75 mg total) by mouth 2 (two) times daily. 50 tablet 2  . insulin aspart (NOVOLOG FLEXPEN) 100 UNIT/ML FlexPen Inject 0-30 units SQ 3 times a day with meals. 27 mL 0  . insulin degludec (TRESIBA FLEXTOUCH) 100 UNIT/ML SOPN FlexTouch Pen Inject 0.3 mLs (30 Units total) into the skin daily. 27 mL 0  . lisinopril-hydrochlorothiazide (PRINZIDE,ZESTORETIC) 20-25 MG tablet TAKE (1) TABLET BY MOUTH DAILY FOR HIGH BLOOD PRESSURE. 90 tablet 0  . metFORMIN (GLUCOPHAGE) 500 MG tablet TAKE 1 TABLET BY MOUTH TWICE DAILY FOR DIABETES 180 tablet 0  . simvastatin (ZOCOR) 20 MG tablet TAKE 1 TABLET BY MOUTH AT  BEDTIME FOR CHOLESTEROL 90 tablet 0  . ONE TOUCH ULTRA TEST test strip CHECK BLOOD SUGAR 4 TIMES DAILY. 100 each 2   No facility-administered medications prior to visit.     ROS See HPI  Objective:  BP (!) 160/110   Pulse 83   Temp 98.2 F (36.8 C)   Ht 5\' 7"  (1.702 m)   Wt 299 lb (135.6 kg)   SpO2 98%   BMI 46.83 kg/m   BP Readings from Last 3 Encounters:  06/08/16 (!) 160/110  04/12/16 (!) 142/100  11/09/15 (!) 165/105    Wt Readings from Last 3 Encounters:  06/08/16 299 lb (135.6 kg)  04/12/16 295 lb (133.8 kg)  11/09/15 285 lb (129.3 kg)    Physical Exam  Constitutional: He is oriented to person, place, and time. No distress.  Eyes: No scleral icterus.  Neck: Normal range of motion. Neck supple.  Cardiovascular: Normal rate and normal heart sounds.   Pulmonary/Chest: Effort normal and breath sounds normal.  Neurological: He is alert and oriented to person, place, and time.  Skin: Skin is warm and dry. Rash noted. Rash is macular. There is erythema.     Removed tick remnant with tweezer and tip of #[id number]. Applied triple antibiotic ointment and covered with bandaid.  Vitals reviewed.   Lab Results  Component Value Date   WBC 8.5 06/06/2007   HGB 12.7 (L) 08/06/2011   HCT 37.7 (L) 08/06/2011   PLT 261 06/06/2007   GLUCOSE 109 (H) 06/06/2007   NA 138 06/06/2007   K 3.8 06/06/2007   CL 102 06/06/2007   CREATININE 1.05  06/06/2007   BUN 14 06/06/2007   CO2 27 06/06/2007   HGBA1C 10.2 (H) 03/23/2015   MICROALBUR 1.2 03/23/2015    Ct Chest Wo Contrast  Result Date: 09/25/2011 *RADIOLOGY REPORT* Clinical Data: Abnormal x-ray. Evaluate lungs. CT CHEST WITHOUT CONTRAST Technique: Multidetector CT imaging of the chest was performed following the standard protocol without IV contrast. Comparison: No priors. Findings: Mediastinum: Heart size is normal. There is no significant pericardial fluid, thickening or pericardial calcification. No pathologically  enlarged mediastinal or hilar lymph nodes. Please note that accurate exclusion of hilar adenopathy is limited on noncontrast CT scans. Esophagus is unremarkable in appearance. Lungs/Pleura: There is a linear opacity in the inferior segment of the lingula, most consistent with an area of scarring or subsegmental atelectasis. No definite suspicious appearing pulmonary nodules or masses. No consolidative airspace disease. No pleural effusions. Upper Abdomen: Diffusely decreased attenuation throughout the hepatic parenchyma, compatible with hepatic steatosis. Visualized portions of the upper abdomen are otherwise unremarkable. Musculoskeletal: There are no aggressive appearing lytic or blastic lesions noted in the visualized portions of the skeleton. IMPRESSION: 1. No suspicious appearing pulmonary nodules or masses identified within the lungs. There is a small area of subsegmental atelectasis or scarring in the inferior segment of the lingula. The appearance of the lungs is otherwise normal. 2. Hepatic steatosis. Original Report Authenticated By: Florencia ReasonsANIEL W. ENTRIKIN, M.D.   Assessment & Plan:   James [name] was seen today for insect bite.  Diagnoses and all orders for this visit:  Tick bite of right shoulder, initial encounter -     Discontinue: doxycycline (VIBRA-TABS) 100 MG tablet; Take 1 tablet (100 mg total) by mouth 2 (two) times daily. -     doxycycline (VIBRA-TABS) 100 MG tablet; Take 1 tablet (100 mg total) by mouth 2 (two) times daily.  Essential hypertension, benign -     amLODipine (NORVASC) 5 MG tablet; Take 1 tablet (5 mg total) by mouth at bedtime.  Uncontrolled type 2 diabetes mellitus without complication, with long-term current use of insulin (HCC) -     glucose blood (ONE TOUCH ULTRA TEST) test strip; 1 each by Other route 4 (four) times daily. Use as instructed   I have changed Mr. James CiscoDuckett's ONE TOUCH ULTRA TEST to glucose blood. I am also having him start on amLODipine. Additionally, I  am having him maintain his diclofenac, lisinopril-hydrochlorothiazide, metFORMIN, insulin degludec, insulin aspart, simvastatin, and doxycycline.  Meds ordered this encounter  Medications  . amLODipine (NORVASC) 5 MG tablet    Sig: Take 1 tablet (5 mg total) by mouth at bedtime.    Dispense:  30 tablet    Refill:  3    Order Specific Question:   Supervising Provider    Answer:   Tresa GarterPLOTNIKOV, ALEKSEI V [[id number]]  . DISCONTD: doxycycline (VIBRA-TABS) 100 MG tablet    Sig: Take 1 tablet (100 mg total) by mouth 2 (two) times daily.    Dispense:  14 tablet    Refill:  0    Order Specific Question:   Supervising Provider    Answer:   Tresa GarterPLOTNIKOV, ALEKSEI V [[id number]]  . doxycycline (VIBRA-TABS) 100 MG tablet    Sig: Take 1 tablet (100 mg total) by mouth 2 (two) times daily.    Dispense:  28 tablet    Refill:  0    Order Specific Question:   Supervising Provider    Answer:   Tresa GarterPLOTNIKOV, ALEKSEI V [[id number]]  . glucose blood (ONE TOUCH ULTRA TEST) test strip  Sig: 1 each by Other route 4 (four) times daily. Use as instructed    Dispense:  100 each    Refill:  3    Order Specific Question:   Supervising Provider    Answer:   Tresa Garter [[id number]]    Follow-up: Return in about 2 weeks (around 06/22/2016) for DM and HTN.  Alysia Penna, NP

## 2016-06-10 ENCOUNTER — Encounter: Payer: Self-pay | Admitting: Family

## 2016-06-21 ENCOUNTER — Encounter: Payer: Self-pay | Admitting: Family

## 2016-06-21 ENCOUNTER — Ambulatory Visit (INDEPENDENT_AMBULATORY_CARE_PROVIDER_SITE_OTHER): Payer: BC Managed Care – PPO | Admitting: Family

## 2016-06-21 VITALS — BP 148/98 | HR 70 | Temp 97.9°F | Resp 16 | Ht 67.0 in | Wt 299.0 lb

## 2016-06-21 DIAGNOSIS — Z794 Long term (current) use of insulin: Secondary | ICD-10-CM | POA: Diagnosis not present

## 2016-06-21 DIAGNOSIS — I10 Essential (primary) hypertension: Secondary | ICD-10-CM | POA: Diagnosis not present

## 2016-06-21 DIAGNOSIS — E1165 Type 2 diabetes mellitus with hyperglycemia: Secondary | ICD-10-CM | POA: Diagnosis not present

## 2016-06-21 DIAGNOSIS — IMO0001 Reserved for inherently not codable concepts without codable children: Secondary | ICD-10-CM

## 2016-06-21 MED ORDER — AMLODIPINE BESYLATE 10 MG PO TABS: 10.0000 mg | ORAL_TABLET | Freq: Every day | ORAL | 0 refills | Status: DC

## 2016-06-21 MED ORDER — DULAGLUTIDE 0.75 MG/0.5ML ~~LOC~~ SOAJ: 0.7500 mg | SUBCUTANEOUS | 2 refills | Status: DC

## 2016-06-21 NOTE — Progress Notes (Signed)
Subjective:    Patient ID: James [name], male    DOB: 09-11-1965, 50 y.o.   MRN: [id number]  Chief Complaint  Patient presents with  . Follow-up    blood pressure and diabetes    HPI:  James [name] is a 50 y.o. male who  has a past medical history of Chicken pox; Diabetes mellitus without complication (HCC); Hyperlipidemia; Hypertension; Kidney stones; and Sleep apnea. and presents today for a follow up office visit.   1.) Hypertension -  Currently maintained on lisinopril-hydrochlorothiazide and amlodipine. Reports taking the medication as prescribed and denies adverse side effects or hypotensive readings. Blood pressures at home have fluctuated in the 130's-150's / 70-80's. Denies worst headache of life with no new symptoms of end organ damage.   BP Readings from Last 3 Encounters:  06/21/16 (!) 148/98  06/08/16 (!) 160/110  04/12/16 (!) 142/100    2.) Type 2 diabetes - currently maintained on metformin, Novolog and Tresiba. Reports taking the medication as prescribed and denies adverse side effects or hypoglycemic readings. Blood sugars at home are slowly coming down with morning readings ranging from 90-150. Currently check sugars 3-4 times per day. He is working on nutrition and has been to diabetic education which is also helping. Denies any numbness or tingling.   Lab Results  Component Value Date   HGBA1C 8.8 (H) 06/08/2016     Allergies  Allergen Reactions  . Ceftriaxone     Other reaction(s): Other (See Comments) leads to increased photosensitivity, per hospital ([facility]) 02/08/2009      Outpatient Medications Prior to Visit  Medication Sig Dispense Refill  . glucose blood (ONE TOUCH ULTRA TEST) test strip 1 each by Other route 4 (four) times daily. Use as instructed 100 each 3  . insulin aspart (NOVOLOG FLEXPEN) 100 UNIT/ML FlexPen Inject 0-30 units SQ 3 times a day with meals. 27 mL 0  . insulin degludec (TRESIBA FLEXTOUCH) 100 UNIT/ML SOPN FlexTouch Pen  Inject 0.3 mLs (30 Units total) into the skin daily. 27 mL 0  . lisinopril-hydrochlorothiazide (PRINZIDE,ZESTORETIC) 20-25 MG tablet TAKE (1) TABLET BY MOUTH DAILY FOR HIGH BLOOD PRESSURE. 90 tablet 0  . metFORMIN (GLUCOPHAGE) 500 MG tablet TAKE 1 TABLET BY MOUTH TWICE DAILY FOR DIABETES 180 tablet 0  . simvastatin (ZOCOR) 20 MG tablet TAKE 1 TABLET BY MOUTH AT BEDTIME FOR CHOLESTEROL 90 tablet 0  . amLODipine (NORVASC) 5 MG tablet Take 1 tablet (5 mg total) by mouth at bedtime. 30 tablet 3  . diclofenac (VOLTAREN) 75 MG EC tablet Take 1 tablet (75 mg total) by mouth 2 (two) times daily. 50 tablet 2  . doxycycline (VIBRA-TABS) 100 MG tablet Take 1 tablet (100 mg total) by mouth 2 (two) times daily. 28 tablet 0   No facility-administered medications prior to visit.       Past Surgical History:  Procedure Laterality Date  . CYSTOSCOPY W/ URETERAL STENT PLACEMENT  08/07/2011   Procedure: CYSTOSCOPY WITH RETROGRADE PYELOGRAM/URETERAL STENT PLACEMENT;  Surgeon: Ky Barban, MD;  Location: [facility];  Service: Urology;  Laterality: Left;  Cystoscopy, Left Retrograde Pyelogram, Left Ureteral Ballon Dilation, Double J Stent Placement  . SHOULDER SURGERY     bilateral      Past Medical History:  Diagnosis Date  . Chicken pox   . Diabetes mellitus without complication (HCC)   . Hyperlipidemia   . Hypertension   . Kidney stones   . Sleep apnea    CiPaP at night  Review of Systems  Constitutional: Negative for chills and fever.  Eyes:       Negative for changes in vision  Respiratory: Negative for cough, chest tightness, shortness of breath and wheezing.   Cardiovascular: Negative for chest pain, palpitations and leg swelling.  Endocrine: Negative for polydipsia, polyphagia and polyuria.  Neurological: Negative for dizziness, weakness, light-headedness and headaches.      Objective:    BP (!) 148/98 (BP Location: Left Arm, Patient Position: Sitting, Cuff Size: Large)   Pulse 70    Temp 97.9 F (36.6 C) (Oral)   Resp 16   Ht 5\' 7"  (1.702 m)   Wt 299 lb (135.6 kg)   SpO2 98%   BMI 46.83 kg/m  Nursing note and vital signs reviewed.  Physical Exam  Constitutional: He is oriented to person, place, and time. He appears well-developed and well-nourished. No distress.  Cardiovascular: Normal rate, regular rhythm, normal heart sounds and intact distal pulses.   Pulmonary/Chest: Effort normal and breath sounds normal.  Musculoskeletal:  Diabetic foot exam - bilateral feet are free from skin breakdown, cuts, and abrasions. Toenails are neatly trimmed. Pulses are intact and appropriate. Sensation is intact to monofilament bilaterally.  Neurological: He is alert and oriented to person, place, and time.  Skin: Skin is warm and dry.  Psychiatric: He has a normal mood and affect. His behavior is normal. Judgment and thought content normal.       Assessment & Plan:   Problem List Items Addressed This Visit      Cardiovascular and Mediastinum   Essential hypertension, benign    Blood pressure remains elevated above goal 140/90 both in office and at home. Denies symptoms of end organ damage or worst headache of life. Increase amlodipine to 10 mg daily. Continue current dosage of lisinopril-hydrochlorothiazide. Encouraged to continue monitoring blood pressure at home and follow low-sodium diet.      Relevant Medications   amLODipine (NORVASC) 10 MG tablet     Endocrine   Type 2 diabetes mellitus, uncontrolled (HCC) - Primary    Type 2 diabetes remains uncontrolled with current regimen with most recent A1c of 8.8. He has worked with nutrition education and is changing his diet which he indicates is going well. Continue current dosage of Tresiba and metformin. Hold Novolog. Start Trulicity with sample provided. Continue to monitor blood sugars 2 times per day. Diabetic foot exam completed today. Diabetic eye exam is up-to-date. Pneumovax completed. Maintained on simvastatin  and lisinopril for CAD risk reduction.      Relevant Medications   Dulaglutide (TRULICITY) 0.75 MG/0.5ML SOPN       I have discontinued Mr. Thedford diclofenac and doxycycline. I have also changed his amLODipine. Additionally, I am having him start on Dulaglutide. Lastly, I am having him maintain his lisinopril-hydrochlorothiazide, metFORMIN, insulin degludec, insulin aspart, simvastatin, and glucose blood.   Meds ordered this encounter  Medications  . amLODipine (NORVASC) 10 MG tablet    Sig: Take 1 tablet (10 mg total) by mouth at bedtime.    Dispense:  90 tablet    Refill:  0    Please fill with patient refill request.    Order Specific Question:   Supervising Provider    Answer:   Hillard Danker A [[id number]]  . Dulaglutide (TRULICITY) 0.75 MG/0.5ML SOPN    Sig: Inject 0.75 mg into the skin once a week.    Dispense:  4 pen    Refill:  2    Order Specific Question:  Supervising Provider    Answer:   Hillard Danker A [[id number]]     Follow-up: Return in about 1 month (around 07/22/2016).  Jeanine Luz, FNP

## 2016-06-21 NOTE — Patient Instructions (Signed)
Thank you for choosing [facility].  SUMMARY AND INSTRUCTIONS:  Happy holidays!  Please continue to work on nutrition and physical activity.  Medication:  Increase your amlodipine to 10 mg daily. This 2 of your current pills. At the next refill it will drop back to 1.  Hold on the Novolog at the moment and start Trulicity weekly. Please check the price with your insurance company if needed.  Continue other medications as prescribed.  Your prescription(s) have been submitted to your pharmacy or been printed and provided for you. Please take as directed and contact our office if you believe you are having problem(s) with the medication(s) or have any questions.  Follow up:  If your symptoms worsen or fail to improve, please contact our office for further instruction, or in case of emergency go directly to the emergency room at the closest medical facility.    DIABETES CARE INSTRUCTIONS:  Current A1c:  Lab Results  Component Value Date   HGBA1C 8.8 (H) 06/08/2016    A1C Goal: <7.0%  Fasting Blood Sugar Goal: 80-130 Blood sugar check that you take after fasting for at least 8 hours which is usually in the morning before eating.  Post-prandial Blood Sugar Goal: <180 Blood sugar check approximately 1-2 hours after the start of a meal.    Diabetes Prevention Screens:  1.) Ensure you have an annual eye exam by an ophthalmologist or optometrist.   2,) Ensure you have an annual foot exam or when any changes are noted including new onset numbness/tingling or wounds. Check your feet daily!  3.) The American Diabetes Association recommends the Pneumovax vaccination against pneumonia every 5 years.  4.) We will check your kidney function with a urine test on an annual basis.

## 2016-06-21 NOTE — Assessment & Plan Note (Signed)
Blood pressure remains elevated above goal 140/90 both in office and at home. Denies symptoms of end organ damage or worst headache of life. Increase amlodipine to 10 mg daily. Continue current dosage of lisinopril-hydrochlorothiazide. Encouraged to continue monitoring blood pressure at home and follow low-sodium diet.

## 2016-06-21 NOTE — Assessment & Plan Note (Signed)
Type 2 diabetes remains uncontrolled with current regimen with most recent A1c of 8.8. He has worked with nutrition education and is changing his diet which he indicates is going well. Continue current dosage of Tresiba and metformin. Hold Novolog. Start Trulicity with sample provided. Continue to monitor blood sugars 2 times per day. Diabetic foot exam completed today. Diabetic eye exam is up-to-date. Pneumovax completed. Maintained on simvastatin and lisinopril for CAD risk reduction.

## 2016-07-04 ENCOUNTER — Ambulatory Visit: Payer: BC Managed Care – PPO | Admitting: *Deleted

## 2016-07-06 ENCOUNTER — Encounter: Payer: Self-pay | Admitting: Family

## 2016-07-09 MED ORDER — EXENATIDE ER 2 MG ~~LOC~~ PEN: 2.0000 mg | PEN_INJECTOR | SUBCUTANEOUS | 2 refills | Status: DC

## 2016-07-12 ENCOUNTER — Other Ambulatory Visit: Payer: Self-pay | Admitting: Family

## 2016-07-13 ENCOUNTER — Telehealth: Payer: Self-pay | Admitting: Family

## 2016-07-13 NOTE — Telephone Encounter (Signed)
Pt called stating Exenatide ER (BYDUREON) 2 MG PEN is not cover by insurance. Pt was wondering what to do, please call him back today, he is going out of town today.

## 2016-07-16 ENCOUNTER — Telehealth: Payer: Self-pay

## 2016-07-16 NOTE — Telephone Encounter (Signed)
PA initiated vis covermymeds. Key: [id number]

## 2016-07-20 ENCOUNTER — Telehealth: Payer: Self-pay

## 2016-07-20 DIAGNOSIS — IMO0001 Reserved for inherently not codable concepts without codable children: Secondary | ICD-10-CM

## 2016-07-20 DIAGNOSIS — Z794 Long term (current) use of insulin: Principal | ICD-10-CM

## 2016-07-20 DIAGNOSIS — E1165 Type 2 diabetes mellitus with hyperglycemia: Principal | ICD-10-CM

## 2016-07-20 MED ORDER — DULAGLUTIDE 0.75 MG/0.5ML ~~LOC~~ SOAJ: 0.7500 mg | SUBCUTANEOUS | 2 refills | Status: DC

## 2016-07-20 NOTE — Telephone Encounter (Signed)
Faxed

## 2016-07-20 NOTE — Telephone Encounter (Signed)
Pts insurance will not cover bydureon. Other alternatives are trulicity and victoza. Please advise

## 2016-07-20 NOTE — Telephone Encounter (Signed)
We can switch to Trulicity. New prescription has been sent.

## 2016-09-15 ENCOUNTER — Other Ambulatory Visit: Payer: Self-pay | Admitting: Family

## 2016-09-15 DIAGNOSIS — I10 Essential (primary) hypertension: Secondary | ICD-10-CM

## 2016-09-19 ENCOUNTER — Ambulatory Visit (INDEPENDENT_AMBULATORY_CARE_PROVIDER_SITE_OTHER): Payer: Self-pay

## 2016-09-19 ENCOUNTER — Ambulatory Visit (INDEPENDENT_AMBULATORY_CARE_PROVIDER_SITE_OTHER): Payer: BC Managed Care – PPO | Admitting: Orthopedic Surgery

## 2016-09-19 ENCOUNTER — Encounter (INDEPENDENT_AMBULATORY_CARE_PROVIDER_SITE_OTHER): Payer: Self-pay | Admitting: Orthopedic Surgery

## 2016-09-19 ENCOUNTER — Encounter (INDEPENDENT_AMBULATORY_CARE_PROVIDER_SITE_OTHER): Payer: Self-pay

## 2016-09-19 DIAGNOSIS — M25562 Pain in left knee: Secondary | ICD-10-CM | POA: Diagnosis not present

## 2016-09-19 NOTE — Progress Notes (Signed)
Office Visit Note   [name]           Date of Birth: [birth year]           MRN: [id number] Visit Date: 09/19/2016 Requested by: Veryl Speak, FNP [address] [place], [place] [address] PCP: Jeanine Luz, FNP  Subjective: Chief Complaint  Patient presents with  . Left Knee - Pain    HPI: James [name] is a 51 year old patient with one-month history of left knee pain.  Reports a constant ache in the knee.  His fall and on 2 occasions.  Describes discrete episodes of giving way.  He describes a pinching sensation on the medial aspect of the knee.  Is a lightning ball type pain.  He cannot hop out of his truck.  It swelled at the beginning slightly over 4 weeks ago and that has since calmed down.  He cannot do tandem stairs.              ROS: All systems reviewed are negative as they relate to the chief complaint within the history of present illness.  Patient denies  fevers or chills.   Assessment & Plan: Visit Diagnoses:  1. Acute pain of left knee     Plan: Impression is left knee pain with medial joint line tenderness positive McMurray compression testing and trace effusion.  He's had discrete episodes of giving way describes pinching and lightning ball type pain.  Statistically speaking this is a medial meniscal tear.  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  Radiographs are unremarkable.  Plan is MRI left knee to evaluate for medial meniscal tear all see him back after that study  Follow-Up Instructions: No Follow-up on file.   Orders:  Orders Placed This Encounter  Procedures  . XR KNEE 3 VIEW LEFT  . MR Knee Left w/o contrast   No orders of the defined types were placed in this encounter.     Procedures: No procedures performed   Clinical Data: No additional findings.  Objective: Vital Signs: There were no vitals taken for this visit.  Physical Exam:   Constitutional: Patient appears well-developed HEENT:  Head:  Normocephalic Eyes:EOM are normal Neck: Normal range of motion Cardiovascular: Normal rate Pulmonary/chest: Effort normal Neurologic: Patient is alert Skin: Skin is warm Psychiatric: Patient has normal mood and affect    Ortho Exam: Orthopedic exam demonstrates some antalgic gait to the left.  Has palpable pedal pulses no groin pain with internal/external rotation of the leg positive medial joint line tenderness no lateral joint line tenderness intact extensor mechanism stable collateral cruciate ligaments positive McMurray compression testing full range of motion no other masses lymph adenopathy or skin changes noted in the left knee region  Specialty Comments:  No specialty comments available.  Imaging: Xr Knee 3 View Left  Result Date: 09/19/2016 [facility] lateral merchant left knee reviewed.  Joint spaces maintained.  Alignment normal.  No spurring present.  No joint space narrowing.  Patella well centered in the trochlea.  No other soft tissue calcifications present    PMFS History: Patient Active Problem List   Diagnosis Date Noted  . Essential hypertension, benign 06/08/2016  . Routine general medical examination at a health care facility 04/12/2016  . Morbid obesity (HCC) 04/12/2016  . Type 2 diabetes mellitus, uncontrolled (HCC) 03/23/2015  . Rash and nonspecific skin eruption 03/23/2015  . Obstructive apnea 04/21/2014   Past Medical History:  Diagnosis Date  . Chicken pox   .  Diabetes mellitus without complication (HCC)   . Hyperlipidemia   . Hypertension   . Kidney stones   . Sleep apnea    CiPaP at night    Family History  Problem Relation Age of Onset  . Prostate cancer Father   . Diabetes Father   . Anesthesia problems Neg Hx   . Hypotension Neg Hx   . Malignant hyperthermia Neg Hx   . Pseudochol deficiency Neg Hx     Past Surgical History:  Procedure Laterality Date  . CYSTOSCOPY W/ URETERAL STENT PLACEMENT  08/07/2011   Procedure: CYSTOSCOPY WITH  RETROGRADE PYELOGRAM/URETERAL STENT PLACEMENT;  Surgeon: Ky BarbanMohammad I Javaid, MD;  Location: [facility];  Service: Urology;  Laterality: Left;  Cystoscopy, Left Retrograde Pyelogram, Left Ureteral Ballon Dilation, Double J Stent Placement  . SHOULDER SURGERY     bilateral   Social History   Occupational History  . Maintenance Mechanic    Social History Main Topics  . Smoking status: Never Smoker  . Smokeless tobacco: Never Used  . Alcohol use No  . Drug use: No  . Sexual activity: Not on file

## 2016-10-08 ENCOUNTER — Ambulatory Visit
Admission: RE | Admit: 2016-10-08 | Discharge: 2016-10-08 | Disposition: A | Discharge status: Home / Self Care | Payer: BC Managed Care – PPO | Source: Ambulatory Visit | Attending: Orthopedic Surgery | Admitting: Orthopedic Surgery

## 2016-10-08 DIAGNOSIS — M25562 Pain in left knee: Secondary | ICD-10-CM

## 2016-10-09 NOTE — Progress Notes (Signed)
I called lmom

## 2016-10-17 ENCOUNTER — Ambulatory Visit (INDEPENDENT_AMBULATORY_CARE_PROVIDER_SITE_OTHER): Payer: BC Managed Care – PPO | Admitting: Orthopedic Surgery

## 2016-10-17 ENCOUNTER — Encounter (INDEPENDENT_AMBULATORY_CARE_PROVIDER_SITE_OTHER): Payer: Self-pay | Admitting: Orthopedic Surgery

## 2016-10-17 DIAGNOSIS — S83242D Other tear of medial meniscus, current injury, left knee, subsequent encounter: Secondary | ICD-10-CM | POA: Diagnosis not present

## 2016-10-18 ENCOUNTER — Telehealth (INDEPENDENT_AMBULATORY_CARE_PROVIDER_SITE_OTHER): Payer: Self-pay | Admitting: Orthopedic Surgery

## 2016-10-18 NOTE — Telephone Encounter (Signed)
Returned pts call about scheduling surgery. Had to lvm. Will await pts call.

## 2016-10-19 NOTE — Progress Notes (Signed)
Office Visit Note   [name]           Date of Birth: [birth year]           MRN: [id number] Visit Date: 10/17/2016 Requested by: Veryl Speak, FNP [address] [place], [place] [address] PCP: Jeanine Luz, FNP  Subjective: Chief Complaint  Patient presents with  . Left Knee - Follow-up    HPI: James [name] is a 51 year old patient with left knee pain.  Reports hurting in popping in the left knee primarily medially.  The brace helps him.  He has a fear of this knee giving out.  No family history of deep vein thrombosis or pulmonary embolism.  Ladder difficult for him.  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              ROS: All systems reviewed are negative as they relate to the chief complaint within the history of present illness.  Patient denies  fevers or chills.   Assessment & Plan: Visit Diagnoses:  1. Other tear of medial meniscus, current injury, left knee, subsequent encounter     Plan: Impression is left knee medial meniscal tear.  Scan is reviewed with the patient.  I think he is having enough mechanical symptoms and pain focally on the medial side that arthroscopy and debridement is indicated.  Risk and benefits are discussed along with the rehabilitation requirements.  Patient understands and wishes to proceed.  We will set that up at his convenience.  Follow-Up Instructions: No Follow-up on file.   Orders:  No orders of the defined types were placed in this encounter.  No orders of the defined types were placed in this encounter.     Procedures: No procedures performed   Clinical Data: No additional findings.  Objective: Vital Signs: There were no vitals taken for this visit.  Physical Exam:   Constitutional: Patient appears well-developed HEENT:  Head: Normocephalic Eyes:EOM are normal Neck: Normal range of motion Cardiovascular: Normal  rate Pulmonary/chest: Effort normal Neurologic: Patient is alert Skin: Skin is warm Psychiatric: Patient has normal mood and affect    Ortho Exam: Orthopedic exam demonstrates good range of motion of that left knee with joint line tenderness medially intact extensor mechanism stable collateral crucial ligaments no other masses lymph adenopathy or skin changes noted in the left knee region no groin pain with internal/external rotation of the leg  Specialty Comments:  No specialty comments available.  Imaging: No results found.   PMFS History: Patient Active Problem List   Diagnosis Date Noted  . Essential hypertension, benign 06/08/2016  . Routine general medical examination at a health care facility 04/12/2016  . Morbid obesity (HCC) 04/12/2016  . Type 2 diabetes mellitus, uncontrolled (HCC) 03/23/2015  . Rash and nonspecific skin eruption 03/23/2015  . Obstructive apnea 04/21/2014   Past Medical History:  Diagnosis Date  . Chicken pox   . Diabetes mellitus without complication (HCC)   . Hyperlipidemia   . Hypertension   . Kidney stones   . Sleep apnea    CiPaP at night    Family History  Problem Relation Age of Onset  . Prostate cancer Father   . Diabetes Father   . Anesthesia problems Neg Hx   . Hypotension Neg Hx   . Malignant hyperthermia Neg Hx   . Pseudochol deficiency Neg Hx     Past  Surgical History:  Procedure Laterality Date  . CYSTOSCOPY W/ URETERAL STENT PLACEMENT  08/07/2011   Procedure: CYSTOSCOPY WITH RETROGRADE PYELOGRAM/URETERAL STENT PLACEMENT;  Surgeon: Ky Barban, MD;  Location: [facility];  Service: Urology;  Laterality: Left;  Cystoscopy, Left Retrograde Pyelogram, Left Ureteral Ballon Dilation, Double J Stent Placement  . SHOULDER SURGERY     bilateral   Social History   Occupational History  . Maintenance Mechanic    Social History Main Topics  . Smoking status: Never Smoker  . Smokeless tobacco: Never Used  . Alcohol use No  .  Drug use: No  . Sexual activity: Not on file

## 2016-10-23 ENCOUNTER — Telehealth (INDEPENDENT_AMBULATORY_CARE_PROVIDER_SITE_OTHER): Payer: Self-pay | Admitting: Orthopedic Surgery

## 2016-10-23 NOTE — Telephone Encounter (Signed)
Note generated and faxed as requested. Called and LM for patient and wife that the amount of time listed on work note was just an approximation and that time out of work could be more or less.

## 2016-10-23 NOTE — Telephone Encounter (Signed)
Patients wife called asking for a note for her husband, he has surgery Monday with Dr. August Saucer. Just needing a note stating he will in fact be in surgery Monday and how long it should probably take for him to recover. Please fax to [phone].  CB # [phone]

## 2016-10-23 NOTE — Telephone Encounter (Signed)
(  Attn: Donaciano Eva)

## 2016-10-24 ENCOUNTER — Telehealth (INDEPENDENT_AMBULATORY_CARE_PROVIDER_SITE_OTHER): Payer: Self-pay | Admitting: Orthopedic Surgery

## 2016-10-24 NOTE — Telephone Encounter (Signed)
Put up front for them to pick up. Tried to call to advise. No answer. LM advising.

## 2016-10-24 NOTE — Telephone Encounter (Signed)
Pt wife calling about pt work note for surgery, asked if Dr. August Saucer can sign instead of typed signature for his employer. She can come pick up.  [phone]

## 2016-10-24 NOTE — Telephone Encounter (Signed)
Patient's wife Velna Hatchet) called asked if the note could be faxed to her. The fax# is [phone]. The number to contact Velna Hatchet is [phone]

## 2016-10-25 NOTE — Telephone Encounter (Signed)
faxed

## 2016-10-29 ENCOUNTER — Other Ambulatory Visit: Payer: Self-pay | Admitting: Family

## 2016-10-29 DIAGNOSIS — S83242D Other tear of medial meniscus, current injury, left knee, subsequent encounter: Secondary | ICD-10-CM | POA: Diagnosis not present

## 2016-11-02 ENCOUNTER — Telehealth (INDEPENDENT_AMBULATORY_CARE_PROVIDER_SITE_OTHER): Payer: Self-pay

## 2016-11-02 NOTE — Telephone Encounter (Signed)
Patient would like to know how soon can he return on the exercise bike and is it okay to take a shower being that waterproof bandage came off.  CB# is [phone].  Please Advise,Thank You.

## 2016-11-02 NOTE — Telephone Encounter (Signed)
s/w patient he will discuss with Dr August Saucerean at post op appt on Monday

## 2016-11-05 ENCOUNTER — Ambulatory Visit (INDEPENDENT_AMBULATORY_CARE_PROVIDER_SITE_OTHER): Payer: BC Managed Care – PPO | Admitting: Orthopedic Surgery

## 2016-11-05 ENCOUNTER — Encounter (INDEPENDENT_AMBULATORY_CARE_PROVIDER_SITE_OTHER): Payer: Self-pay | Admitting: Orthopedic Surgery

## 2016-11-05 DIAGNOSIS — M25562 Pain in left knee: Secondary | ICD-10-CM

## 2016-11-05 NOTE — Progress Notes (Signed)
Post-Op Visit Note   [name]           Date of Birth: Dec 27, 1965           MRN: [id number] Visit Date: 11/05/2016 PCP: Veryl Speak, FNP   Assessment & Plan:  Chief Complaint:  Chief Complaint  Patient presents with  . Left Knee - Routine Post Op   Visit Diagnoses:  1. Left knee pain, unspecified chronicity     Plan: Duty is a 51 year old patient 1 week out left knee arthroscopy partial medial meniscectomy on exam he has trace effusion but good range of motion portal sutures intact he's not taking any pain medicine there is no calf tenderness plan is to begin stationary bike and quad strengthening exercises follow-up in 3 weeks possible return to work at that time.  Follow-Up Instructions: Return in about 3 weeks (around 11/26/2016).   Orders:  No orders of the defined types were placed in this encounter.  No orders of the defined types were placed in this encounter.   Imaging: No results found.  PMFS History: Patient Active Problem List   Diagnosis Date Noted  . Essential hypertension, benign 06/08/2016  . Routine general medical examination at a health care facility 04/12/2016  . Morbid obesity (HCC) 04/12/2016  . Type 2 diabetes mellitus, uncontrolled (HCC) 03/23/2015  . Rash and nonspecific skin eruption 03/23/2015  . Obstructive apnea 04/21/2014   Past Medical History:  Diagnosis Date  . Chicken pox   . Diabetes mellitus without complication (HCC)   . Hyperlipidemia   . Hypertension   . Kidney stones   . Sleep apnea    CiPaP at night    Family History  Problem Relation Age of Onset  . Prostate cancer Father   . Diabetes Father   . Anesthesia problems Neg Hx   . Hypotension Neg Hx   . Malignant hyperthermia Neg Hx   . Pseudochol deficiency Neg Hx     Past Surgical History:  Procedure Laterality Date  . CYSTOSCOPY W/ URETERAL STENT PLACEMENT  08/07/2011   Procedure: CYSTOSCOPY WITH RETROGRADE PYELOGRAM/URETERAL STENT PLACEMENT;   Surgeon: Ky Barban, MD;  Location: [facility];  Service: Urology;  Laterality: Left;  Cystoscopy, Left Retrograde Pyelogram, Left Ureteral Ballon Dilation, Double J Stent Placement  . SHOULDER SURGERY     bilateral   Social History   Occupational History  . Maintenance Mechanic    Social History Main Topics  . Smoking status: Never Smoker  . Smokeless tobacco: Never Used  . Alcohol use No  . Drug use: No  . Sexual activity: Not on file

## 2016-11-22 ENCOUNTER — Ambulatory Visit (INDEPENDENT_AMBULATORY_CARE_PROVIDER_SITE_OTHER): Payer: BC Managed Care – PPO | Admitting: Orthopedic Surgery

## 2016-11-29 ENCOUNTER — Ambulatory Visit (INDEPENDENT_AMBULATORY_CARE_PROVIDER_SITE_OTHER): Payer: BC Managed Care – PPO | Admitting: Orthopedic Surgery

## 2016-11-29 DIAGNOSIS — S838X2S Sprain of other specified parts of left knee, sequela: Secondary | ICD-10-CM

## 2016-11-29 NOTE — Progress Notes (Signed)
   Post-Op Visit Note   [name]           Date of Birth: [birth year]           MRN: [id number] Visit Date: 11/29/2016 PCP: Veryl Speakalone, Gregory D, FNP   Assessment & Plan:  Chief Complaint:  Chief Complaint  Patient presents with  . Left Knee - Pain   Visit Diagnoses: No diagnosis found.  Plan:   He had knee arthroscopy.  He is 4 weeks out.  He is doing well.   On exam he has good range of motion of the  Knee and good quad strength. With no effusion  I'll let him return to work on Monday.  Follow-up with me as needed  Follow-Up Instructions: Return if symptoms worsen or fail to improve.   Orders:  No orders of the defined types were placed in this encounter.  No orders of the defined types were placed in this encounter.   Imaging: No results found.  PMFS History: Patient Active Problem List   Diagnosis Date Noted  . Essential hypertension, benign 06/08/2016  . Routine general medical examination at a health care facility 04/12/2016  . Morbid obesity (HCC) 04/12/2016  . Type 2 diabetes mellitus, uncontrolled (HCC) 03/23/2015  . Rash and nonspecific skin eruption 03/23/2015  . Obstructive apnea 04/21/2014   Past Medical History:  Diagnosis Date  . Chicken pox   . Diabetes mellitus without complication (HCC)   . Hyperlipidemia   . Hypertension   . Kidney stones   . Sleep apnea    CiPaP at night    Family History  Problem Relation Age of Onset  . Prostate cancer Father   . Diabetes Father   . Anesthesia problems Neg Hx   . Hypotension Neg Hx   . Malignant hyperthermia Neg Hx   . Pseudochol deficiency Neg Hx     Past Surgical History:  Procedure Laterality Date  . CYSTOSCOPY W/ URETERAL STENT PLACEMENT  08/07/2011   Procedure: CYSTOSCOPY WITH RETROGRADE PYELOGRAM/URETERAL STENT PLACEMENT;  Surgeon: Ky BarbanMohammad I Javaid, MD;  Location: [facility];  Service: Urology;  Laterality: Left;  Cystoscopy, Left Retrograde Pyelogram, Left Ureteral Ballon Dilation,  Double J Stent Placement  . SHOULDER SURGERY     bilateral   Social History   Occupational History  . Maintenance Mechanic    Social History Main Topics  . Smoking status: Never Smoker  . Smokeless tobacco: Never Used  . Alcohol use No  . Drug use: No  . Sexual activity: Not on file

## 2017-02-26 ENCOUNTER — Other Ambulatory Visit: Payer: Self-pay | Admitting: Family

## 2017-02-26 DIAGNOSIS — I10 Essential (primary) hypertension: Secondary | ICD-10-CM

## 2017-03-01 ENCOUNTER — Ambulatory Visit: Payer: BC Managed Care – PPO | Admitting: Family

## 2017-03-05 ENCOUNTER — Encounter: Payer: Self-pay | Admitting: Family

## 2017-03-05 ENCOUNTER — Ambulatory Visit (INDEPENDENT_AMBULATORY_CARE_PROVIDER_SITE_OTHER): Payer: BC Managed Care – PPO | Admitting: Family

## 2017-03-05 VITALS — BP 134/84 | HR 74 | Temp 98.2°F | Resp 16 | Ht 67.0 in | Wt 275.0 lb

## 2017-03-05 DIAGNOSIS — E1165 Type 2 diabetes mellitus with hyperglycemia: Secondary | ICD-10-CM | POA: Diagnosis not present

## 2017-03-05 DIAGNOSIS — Z23 Encounter for immunization: Secondary | ICD-10-CM

## 2017-03-05 DIAGNOSIS — Z794 Long term (current) use of insulin: Secondary | ICD-10-CM

## 2017-03-05 DIAGNOSIS — IMO0001 Reserved for inherently not codable concepts without codable children: Secondary | ICD-10-CM

## 2017-03-05 LAB — POCT GLYCOSYLATED HEMOGLOBIN (HGB A1C): Hemoglobin A1C: 9.3

## 2017-03-05 MED ORDER — INSULIN DEGLUDEC 100 UNIT/ML ~~LOC~~ SOPN: 30.0000 [IU] | PEN_INJECTOR | Freq: Every day | SUBCUTANEOUS | 0 refills | Status: DC

## 2017-03-05 NOTE — Patient Instructions (Signed)
Thank you for choosing [facility].  SUMMARY AND INSTRUCTIONS:  Please take the metformin and Tresiba.   Continue to monitor your blood sugars at home.   Continue to work on nutrition and physical activity.  Follow up in 3 months for A1c check.   Medication:  Your prescription(s) have been submitted to your pharmacy or been printed and provided for you. Please take as directed and contact our office if you believe you are having problem(s) with the medication(s) or have any questions.    Follow up:  If your symptoms worsen or fail to improve, please contact our office for further instruction, or in case of emergency go directly to the emergency room at the closest medical facility.

## 2017-03-05 NOTE — Assessment & Plan Note (Signed)
Type 2 diabetes poorly controlled with current medication regimen secondary to less than optimal patient compliance with medication regimen. In office A1c of 9.3. No adverse side effects or hypoglycemic readings. Maintained on lisinopril and simvastatin for CAD risk reduction. Encouraged to complete diabetic eye exam independently. Diabetic foot exam and Pneumovax are up-to-date per recommendations. Continue current dosage of metformin and Tresiba. Monitor blood sugars at home and recommend a low/modified carbohydrate intake. Increase physical activity. Follow-up in 3 months for A1c check.

## 2017-03-05 NOTE — Progress Notes (Signed)
Subjective:    Patient ID: James [name], male    DOB: 08-11-1965, 51 y.o.   MRN: [id number]  Chief Complaint  Patient presents with  . Follow-up    diabetes    HPI:  James [name] is a 51 y.o. male who  has a past medical history of Chicken pox; Diabetes mellitus without complication (HCC); Hyperlipidemia; Hypertension; Kidney stones; and Sleep apnea. and presents today for a follow up office visit.  Diabetes - Currently maintained on metformin, Tresiba, Trulicity, and Novolog. Reports that he has not been consistent taking his medications. Previously was exercising and numbers were well controlled, and when he had meniscal surgery he stopped exercising as much. Blood sugars have been labile ranging from the 100's-300's. No execessive hunger, thirst or urination. No numbness or tingling.   Allergies  Allergen Reactions  . Ceftriaxone     Other reaction(s): Other (See Comments) leads to increased photosensitivity, per hospital ([facility]) 02/08/2009      Outpatient Medications Prior to Visit  Medication Sig Dispense Refill  . amLODipine (NORVASC) 10 MG tablet TAKE 1 TABLET BY MOUTH ONCE DAILY AT BEDTIME 90 tablet 0  . glucose blood (ONE TOUCH ULTRA TEST) test strip 1 each by Other route 4 (four) times daily. Use as instructed 100 each 3  . lisinopril-hydrochlorothiazide (PRINZIDE,ZESTORETIC) 20-25 MG tablet TAKE ONE TABLET BY MOUTH ONCE DAILY FOR  HIGH  BLOOD  PRESSURE 90 tablet 1  . metFORMIN (GLUCOPHAGE) 500 MG tablet TAKE 1 TABLET BY MOUTH TWICE DAILY FOR DIABETES 180 tablet 0  . NOVOLOG FLEXPEN 100 UNIT/ML FlexPen INJECT UP TO 30 UNITS THREE TIMES A DAY WITH MEALS 30 mL 6  . simvastatin (ZOCOR) 20 MG tablet TAKE 1 TABLET BY MOUTH AT BEDTIME FOR  CHOLESTEROL 90 tablet 0  . Dulaglutide (TRULICITY) 0.75 MG/0.5ML SOPN Inject 0.75 mg into the skin once a week. 4 pen 2  . insulin degludec (TRESIBA FLEXTOUCH) 100 UNIT/ML SOPN FlexTouch Pen Inject 0.3 mLs (30 Units total) into the skin  daily. 27 mL 0   No facility-administered medications prior to visit.       Past Medical History:  Diagnosis Date  . Chicken pox   . Diabetes mellitus without complication (HCC)   . Hyperlipidemia   . Hypertension   . Kidney stones   . Sleep apnea    CiPaP at night    Review of Systems  Eyes:       Negative for changes in vision.  Respiratory: Negative for chest tightness and shortness of breath.   Cardiovascular: Negative for chest pain, palpitations and leg swelling.  Endocrine: Negative for polydipsia, polyphagia and polyuria.  Neurological: Negative for dizziness, weakness, light-headedness and headaches.      Objective:    BP 134/84 (BP Location: Left Arm, Patient Position: Sitting, Cuff Size: Large)   Pulse 74   Temp 98.2 F (36.8 C) (Oral)   Resp 16   Ht 5\' 7"  (1.702 m)   Wt 275 lb (124.7 kg)   SpO2 98%   BMI 43.07 kg/m  Nursing note and vital signs reviewed.  Physical Exam  Constitutional: He is oriented to person, place, and time. He appears well-developed and well-nourished. No distress.  Cardiovascular: Normal rate, regular rhythm, normal heart sounds and intact distal pulses.  Exam reveals no gallop and no friction rub.   No murmur heard. Pulmonary/Chest: Effort normal and breath sounds normal. No respiratory distress. He has no wheezes. He has no rales. He exhibits no  tenderness.  Neurological: He is alert and oriented to person, place, and time.  Skin: Skin is warm and dry.  Psychiatric: He has a normal mood and affect. His behavior is normal. Judgment and thought content normal.       Assessment & Plan:   Problem List Items Addressed This Visit      Endocrine   Type 2 diabetes mellitus, uncontrolled (HCC) - Primary    Type 2 diabetes poorly controlled with current medication regimen secondary to less than optimal patient compliance with medication regimen. In office A1c of 9.3. No adverse side effects or hypoglycemic readings. Maintained on  lisinopril and simvastatin for CAD risk reduction. Encouraged to complete diabetic eye exam independently. Diabetic foot exam and Pneumovax are up-to-date per recommendations. Continue current dosage of metformin and Tresiba. Monitor blood sugars at home and recommend a low/modified carbohydrate intake. Increase physical activity. Follow-up in 3 months for A1c check.      Relevant Medications   insulin degludec (TRESIBA FLEXTOUCH) 100 UNIT/ML SOPN FlexTouch Pen   Other Relevant Orders   POCT HgB A1C (Completed)    Other Visit Diagnoses    Need for influenza vaccination       Relevant Orders   Flu Vaccine QUAD 36+ mos IM (Completed)       I have discontinued [name]'s Dulaglutide. I am also having him maintain his glucose blood, NOVOLOG FLEXPEN, lisinopril-hydrochlorothiazide, amLODipine, simvastatin, metFORMIN, and insulin degludec.   Meds ordered this encounter  Medications  . insulin degludec (TRESIBA FLEXTOUCH) 100 UNIT/ML SOPN FlexTouch Pen    Sig: Inject 0.3 mLs (30 Units total) into the skin daily.    Dispense:  27 mL    Refill:  0    Order Specific Question:   Supervising Provider    Answer:   Hillard DankerCRAWFORD, ELIZABETH A [[id number]]     Follow-up: Return in about 3 months (around 06/04/2017), or if symptoms worsen or fail to improve.  Jeanine Luzalone, Sojourner Behringer, FNP

## 2017-05-07 ENCOUNTER — Ambulatory Visit (INDEPENDENT_AMBULATORY_CARE_PROVIDER_SITE_OTHER): Payer: BC Managed Care – PPO | Admitting: Orthopedic Surgery

## 2017-05-13 ENCOUNTER — Encounter (INDEPENDENT_AMBULATORY_CARE_PROVIDER_SITE_OTHER): Payer: Self-pay | Admitting: Orthopedic Surgery

## 2017-05-13 ENCOUNTER — Ambulatory Visit (INDEPENDENT_AMBULATORY_CARE_PROVIDER_SITE_OTHER): Payer: Self-pay

## 2017-05-13 ENCOUNTER — Ambulatory Visit (INDEPENDENT_AMBULATORY_CARE_PROVIDER_SITE_OTHER): Payer: BC Managed Care – PPO | Admitting: Orthopedic Surgery

## 2017-05-13 DIAGNOSIS — M25571 Pain in right ankle and joints of right foot: Secondary | ICD-10-CM | POA: Diagnosis not present

## 2017-05-15 NOTE — Progress Notes (Signed)
Office Visit Note   [name]           Date of Birth: September 24, 1965           MRN: [id number] Visit Date: 05/13/2017 Requested by: Veryl Speak, FNP [address] [place], [place] [address] PCP: Veryl Speak, FNP  Subjective: Chief Complaint  Patient presents with  . Right Ankle - Pain    HPI: James [name] is a 51 year old patient with one-month history atraumatic onset right ankle pain.  He describes decreased range of motion and pain with weightbearing.  He has tried ibuprofen with no relief.  Denies any left ankle pain but this only hurts him on the right.  With heel compaction his ankle becomes painful.  He actually can walk without much difficulty.              ROS: All systems reviewed are negative as they relate to the chief complaint within the history of present illness.  Patient denies  fevers or chills.   Assessment & Plan: Visit Diagnoses:  1. Pain in right ankle and joints of right foot     Plan: Impression is osteochondral lesion on the right ankle medial talar dome.  This may or may not be an operative problem.  He is having some mechanical symptoms with impact.  Has not had an injury.  He needs MRI scan to define the stability and the extent of this osteochondral lesion.  I'll see him back after that study  Follow-Up Instructions: Return for after MRI.   Orders:  Orders Placed This Encounter  Procedures  . XR Ankle Complete Right  . MR Ankle Right w/o contrast   No orders of the defined types were placed in this encounter.     Procedures: No procedures performed   Clinical Data: No additional findings.  Objective: Vital Signs: There were no vitals taken for this visit.  Physical Exam:   Constitutional: Patient appears well-developed HEENT:  Head: Normocephalic Eyes:EOM are normal Neck: Normal range of motion Cardiovascular: Normal rate Pulmonary/chest: Effort normal Neurologic: Patient is alert Skin: Skin is  warm Psychiatric: Patient has normal mood and affect    Ortho Exam: Orthopedic exam demonstrates slightly antalgic gait to the right.  Does have medial and lateral ankle tenderness.  Palpable intact nontender anterior to posterior tib peroneal and Achilles tendon with full range of motion and palpable pedal pulses.  No paresthesias on the dorsal or palmar aspect of the foot.  There is some coarse grinding and crepitus with active and passive range of motion of the ankle  Specialty Comments:  No specialty comments available.  Imaging: No results found.   PMFS History: Patient Active Problem List   Diagnosis Date Noted  . Essential hypertension, benign 06/08/2016  . Routine general medical examination at a health care facility 04/12/2016  . Morbid obesity (HCC) 04/12/2016  . Type 2 diabetes mellitus, uncontrolled (HCC) 03/23/2015  . Rash and nonspecific skin eruption 03/23/2015  . Obstructive apnea 04/21/2014   Past Medical History:  Diagnosis Date  . Chicken pox   . Diabetes mellitus without complication (HCC)   . Hyperlipidemia   . Hypertension   . Kidney stones   . Sleep apnea    CiPaP at night    Family History  Problem Relation Age of Onset  . Prostate cancer Father   . Diabetes Father   . Anesthesia problems Neg Hx   . Hypotension Neg Hx   .  Malignant hyperthermia Neg Hx   . Pseudochol deficiency Neg Hx     Past Surgical History:  Procedure Laterality Date  . SHOULDER SURGERY     bilateral   Social History   Occupational History  . Occupation: Data processing manager  Tobacco Use  . Smoking status: Never Smoker  . Smokeless tobacco: Never Used  Substance and Sexual Activity  . Alcohol use: No  . Drug use: No  . Sexual activity: Not on file

## 2017-05-21 ENCOUNTER — Other Ambulatory Visit: Payer: Self-pay

## 2017-05-21 MED ORDER — LISINOPRIL-HYDROCHLOROTHIAZIDE 20-25 MG PO TABS: ORAL_TABLET | ORAL | 0 refills | Status: DC

## 2017-05-26 ENCOUNTER — Ambulatory Visit
Admission: RE | Admit: 2017-05-26 | Discharge: 2017-05-26 | Disposition: A | Discharge status: Home / Self Care | Payer: BC Managed Care – PPO | Source: Ambulatory Visit | Attending: Orthopedic Surgery | Admitting: Orthopedic Surgery

## 2017-05-26 DIAGNOSIS — M25571 Pain in right ankle and joints of right foot: Secondary | ICD-10-CM

## 2017-05-27 ENCOUNTER — Ambulatory Visit (INDEPENDENT_AMBULATORY_CARE_PROVIDER_SITE_OTHER): Payer: BC Managed Care – PPO | Admitting: Orthopedic Surgery

## 2017-05-27 ENCOUNTER — Encounter (INDEPENDENT_AMBULATORY_CARE_PROVIDER_SITE_OTHER): Payer: Self-pay | Admitting: Orthopedic Surgery

## 2017-05-27 DIAGNOSIS — M958 Other specified acquired deformities of musculoskeletal system: Secondary | ICD-10-CM

## 2017-05-27 NOTE — Progress Notes (Signed)
Office Visit Note   [name]           Date of Birth: 09/13/1965           MRN: [id number] Visit Date: 05/27/2017 Requested by: James [name], James [name] D, FNP [address] [place], [place] [id number] PCP: Patient, No Pcp Per  Subjective: Chief Complaint  Patient presents with  . Right Ankle - Follow-up    HPI: DV is a patient with right ankle pain.  Plain radiographs suggested osteochondral lesion on the talar dome after last clinic visit.  MRI scan is reviewed.  The MRI reading is incorrect.  There is an osteochondral lesion on the talar dome medially which correlates to the plain radiograph findings.  The patient has continued pain with any type of torsion on the foot.  Straight ahead walking his tolerable.  His son graduate from college December 14 and he would like to undergo intervention sometime after that date.  No family or personal history of DVT or pulmonary embolism              ROS: All systems reviewed are negative as they relate to the chief complaint within the history of present illness.  Patient denies  fevers or chills.   Assessment & Plan: Visit Diagnoses:  1. Osteochondral defect of talus     Plan: Impression is osteochondral defect of the talus on the right ankle.  He is having very significant pain with this.  I think arthroscopic debridement and microfracture with a.  Of time off the ankle is indicated.  Dr. Lajoyce [name] examined the patient today as well and review the MRI scan and will perform the surgery.  The risk and benefits are discussed including not limited to incomplete pain relief infection ankle stiffness and potential need for more surgery in the future.  All questions answered.  Follow-Up Instructions: No Follow-up on file.   Orders:  No orders of the defined types were placed in this encounter.  No orders of the defined types were placed in this encounter.     Procedures: No procedures performed   Clinical Data: No additional  findings.  Objective: Vital Signs: There were no vitals taken for this visit.  Physical Exam:   Constitutional: Patient appears well-developed HEENT:  Head: Normocephalic Eyes:EOM are normal Neck: Normal range of motion Cardiovascular: Normal rate Pulmonary/chest: Effort normal Neurologic: Patient is alert Skin: Skin is warm Psychiatric: Patient has normal mood and affect    Ortho Exam: Orthopedic exam demonstrates tenderness circumferentially around the ankle joint but primarily anterior medial posterior medial and to a lesser degree posterior lateral.  Patient has palpable intact nontender anterior to posterior tib peroneal and Achilles tendons.  Mild pain with maximum plantarflexion as well as torsion.  Specialty Comments:  No specialty comments available.  Imaging: No results found.   PMFS History: Patient Active Problem List   Diagnosis Date Noted  . Essential hypertension, benign 06/08/2016  . Routine general medical examination at a health care facility 04/12/2016  . Morbid obesity (HCC) 04/12/2016  . Type 2 diabetes mellitus, uncontrolled (HCC) 03/23/2015  . Rash and nonspecific skin eruption 03/23/2015  . Obstructive apnea 04/21/2014   Past Medical History:  Diagnosis Date  . Chicken pox   . Diabetes mellitus without complication (HCC)   . Hyperlipidemia   . Hypertension   . Kidney stones   . Sleep apnea    CiPaP at night    Family History  Problem Relation  Age of Onset  . Prostate cancer Father   . Diabetes Father   . Anesthesia problems Neg Hx   . Hypotension Neg Hx   . Malignant hyperthermia Neg Hx   . Pseudochol deficiency Neg Hx     Past Surgical History:  Procedure Laterality Date  . CYSTOSCOPY W/ URETERAL STENT PLACEMENT  08/07/2011   Procedure: CYSTOSCOPY WITH RETROGRADE PYELOGRAM/URETERAL STENT PLACEMENT;  Surgeon: James BarbanMohammad I Javaid, MD;  Location: [facility];  Service: Urology;  Laterality: Left;  Cystoscopy, Left Retrograde Pyelogram, Left  Ureteral Ballon Dilation, Double J Stent Placement  . SHOULDER SURGERY     bilateral   Social History   Occupational History  . Occupation: Data processing managerMaintenance Mechanic  Tobacco Use  . Smoking status: Never Smoker  . Smokeless tobacco: Never Used  Substance and Sexual Activity  . Alcohol use: No  . Drug use: No  . Sexual activity: Not on file

## 2017-05-31 ENCOUNTER — Telehealth (INDEPENDENT_AMBULATORY_CARE_PROVIDER_SITE_OTHER): Payer: Self-pay | Admitting: Orthopedic Surgery

## 2017-05-31 NOTE — Telephone Encounter (Signed)
Patient would like a call back to schedule his right ankle surgery with Dr. Lajoyce Cornersuda. Would like to schedule before the end of the year. Can call patient or his wife [phone] or [phone]

## 2017-06-03 ENCOUNTER — Telehealth (INDEPENDENT_AMBULATORY_CARE_PROVIDER_SITE_OTHER): Payer: Self-pay | Admitting: Orthopedic Surgery

## 2017-06-03 NOTE — Telephone Encounter (Signed)
I spoke with pt's wife, Velna HatchetSheila.  [facility] is [facility].  All information given.

## 2017-06-03 NOTE — Telephone Encounter (Signed)
IC s/w patient and he stated that all of his questions had already been answered.

## 2017-06-03 NOTE — Telephone Encounter (Signed)
Patient spouse called and wanted to you to call in regard to surgery

## 2017-06-18 DIAGNOSIS — M19171 Post-traumatic osteoarthritis, right ankle and foot: Secondary | ICD-10-CM | POA: Diagnosis not present

## 2017-06-20 ENCOUNTER — Encounter (HOSPITAL_COMMUNITY)
Admission: RE | Admit: 2017-06-20 | Discharge: 2017-06-20 | Disposition: A | Discharge status: Home / Self Care | Payer: BC Managed Care – PPO | Source: Ambulatory Visit | Attending: Orthopedic Surgery | Admitting: Orthopedic Surgery

## 2017-06-20 ENCOUNTER — Other Ambulatory Visit (INDEPENDENT_AMBULATORY_CARE_PROVIDER_SITE_OTHER): Payer: Self-pay | Admitting: Family

## 2017-06-20 ENCOUNTER — Encounter (HOSPITAL_COMMUNITY): Payer: Self-pay

## 2017-06-20 ENCOUNTER — Other Ambulatory Visit: Payer: Self-pay

## 2017-06-20 DIAGNOSIS — G8929 Other chronic pain: Secondary | ICD-10-CM | POA: Diagnosis not present

## 2017-06-20 DIAGNOSIS — Z7984 Long term (current) use of oral hypoglycemic drugs: Secondary | ICD-10-CM | POA: Diagnosis not present

## 2017-06-20 DIAGNOSIS — Z9989 Dependence on other enabling machines and devices: Secondary | ICD-10-CM | POA: Diagnosis not present

## 2017-06-20 DIAGNOSIS — Z6841 Body Mass Index (BMI) 40.0 and over, adult: Secondary | ICD-10-CM | POA: Diagnosis not present

## 2017-06-20 DIAGNOSIS — Z87442 Personal history of urinary calculi: Secondary | ICD-10-CM | POA: Diagnosis not present

## 2017-06-20 DIAGNOSIS — G4730 Sleep apnea, unspecified: Secondary | ICD-10-CM | POA: Diagnosis not present

## 2017-06-20 DIAGNOSIS — Z794 Long term (current) use of insulin: Secondary | ICD-10-CM | POA: Diagnosis not present

## 2017-06-20 DIAGNOSIS — M216X1 Other acquired deformities of right foot: Secondary | ICD-10-CM | POA: Diagnosis present

## 2017-06-20 DIAGNOSIS — E785 Hyperlipidemia, unspecified: Secondary | ICD-10-CM | POA: Diagnosis not present

## 2017-06-20 DIAGNOSIS — M25571 Pain in right ankle and joints of right foot: Secondary | ICD-10-CM | POA: Diagnosis not present

## 2017-06-20 DIAGNOSIS — I10 Essential (primary) hypertension: Secondary | ICD-10-CM | POA: Diagnosis not present

## 2017-06-20 DIAGNOSIS — Z888 Allergy status to other drugs, medicaments and biological substances status: Secondary | ICD-10-CM | POA: Diagnosis not present

## 2017-06-20 DIAGNOSIS — M65871 Other synovitis and tenosynovitis, right ankle and foot: Secondary | ICD-10-CM | POA: Diagnosis not present

## 2017-06-20 DIAGNOSIS — Z79899 Other long term (current) drug therapy: Secondary | ICD-10-CM | POA: Diagnosis not present

## 2017-06-20 DIAGNOSIS — M24071 Loose body in right ankle: Secondary | ICD-10-CM | POA: Diagnosis not present

## 2017-06-20 DIAGNOSIS — E119 Type 2 diabetes mellitus without complications: Secondary | ICD-10-CM | POA: Diagnosis not present

## 2017-06-20 HISTORY — DX: Personal history of urinary calculi: Z87.442

## 2017-06-20 LAB — CBC
HEMATOCRIT: 43.8 % (ref 39.0–52.0)
HEMOGLOBIN: 15.8 g/dL (ref 13.0–17.0)
MCH: 30.4 pg (ref 26.0–34.0)
MCHC: 36.1 g/dL — ABNORMAL HIGH (ref 30.0–36.0)
MCV: 84.2 fL (ref 78.0–100.0)
Platelets: 207 10*3/uL (ref 150–400)
RBC: 5.2 MIL/uL (ref 4.22–5.81)
RDW: 13.7 % (ref 11.5–15.5)
WBC: 9.3 10*3/uL (ref 4.0–10.5)

## 2017-06-20 LAB — BASIC METABOLIC PANEL
ANION GAP: 9 (ref 5–15)
BUN: 24 mg/dL — ABNORMAL HIGH (ref 6–20)
CALCIUM: 9.9 mg/dL (ref 8.9–10.3)
CO2: 26 mmol/L (ref 22–32)
Chloride: 101 mmol/L (ref 101–111)
Creatinine, Ser: 1.02 mg/dL (ref 0.61–1.24)
GLUCOSE: 218 mg/dL — AB (ref 65–99)
POTASSIUM: 3.8 mmol/L (ref 3.5–5.1)
SODIUM: 136 mmol/L (ref 135–145)

## 2017-06-20 LAB — GLUCOSE, CAPILLARY: GLUCOSE-CAPILLARY: 195 mg/dL — AB (ref 65–99)

## 2017-06-20 LAB — HEMOGLOBIN A1C
HEMOGLOBIN A1C: 9.9 % — AB (ref 4.8–5.6)
MEAN PLASMA GLUCOSE: 237.43 mg/dL

## 2017-06-20 NOTE — Progress Notes (Addendum)
[facility]Dr. Lolita RiegerGregory Cologne  Cardiologist: pt denies  EKG: 06/20/17 in EPIC  Stress test: pt denies ever  ECHO: pt denies ever  Cardiac Cath: pt denies ever  Chest x-ray: pt denies past year-no recent respiratory complications/infections

## 2017-06-20 NOTE — Pre-Procedure Instructions (Signed)
James [name]  06/20/2017      [facility] [address], [place] - [address] [place] [id number] Phone: [phone] Fax: [phone]    Your procedure is scheduled on June 21, 2017.  [facility] at 710 AM.  Call this number if you have problems the morning of surgery:  [phone]   Remember:  Do not eat food or drink liquids after midnight.  Take these medicines the morning of surgery with A SIP OF WATER amlodipine (norvasc).  STOP taking any Aspirin (unless otherwise instructed by your surgeon), Aleve, Naproxen, Ibuprofen, Motrin, Advil, Goody's, BC's, all herbal medications, fish oil, and all vitamins  Continue all other medications as instructed by your physician except follow the above medication instructions before surgery   WHAT DO I DO ABOUT MY DIABETES MEDICATION?  Marland Kitchen. Do not take oral diabetes medicines (pills) the morning of surgery.  . THE NIGHT BEFORE SURGERY, take 12 units of Tresiba insulin.       . THE MORNING OF SURGERY, take 12 units of Tresiba insulin.     . If your CBG is greater than 220 mg/dL, you may take  of your sliding scale (correction) dose of insulin the day of surgery, take normal sliding scale dose the night before surgery with evening meal.   How to Manage Your Diabetes Before and After Surgery  Why is it important to control my blood sugar before and after surgery? . Improving blood sugar levels before and after surgery helps healing and can limit problems. . A way of improving blood sugar control is eating a healthy diet by: o  Eating less sugar and carbohydrates o  Increasing activity/exercise o  Talking with your doctor about reaching your blood sugar goals . High blood sugars (greater than 180 mg/dL) can raise your risk of infections and slow your recovery, so you will need to focus on controlling your diabetes during the weeks before surgery. . Make sure that  the doctor who takes care of your diabetes knows about your planned surgery including the date and location.  How do I manage my blood sugar before surgery? . Check your blood sugar at least 4 times a day, starting 2 days before surgery, to make sure that the level is not too high or low. o Check your blood sugar the morning of your surgery when you wake up and every 2 hours until you get to the Short Stay unit. . If your blood sugar is less than 70 mg/dL, you will need to treat for low blood sugar: o Do not take insulin. o Treat a low blood sugar (less than 70 mg/dL) with  cup of clear juice (cranberry or apple), 4 glucose tablets, OR glucose gel. Recheck blood sugar in 15 minutes after treatment (to make sure it is greater than 70 mg/dL). If your blood sugar is not greater than 70 mg/dL on recheck, call [phone] o  for further instructions. . Report your blood sugar to the short stay nurse when you get to Short Stay.  . If you are admitted to the hospital after surgery: o Your blood sugar will be checked by the staff and you will probably be given insulin after surgery (instead of oral diabetes medicines) to make sure you have good blood sugar levels. o The goal for blood sugar control after surgery is 80-180 mg/dL   Reviewed and Endorsed by [facility] Patient Education Committee, August  2015   Do not wear jewelry, make-up or nail polish.  Do not wear lotions, powders, or perfumes, or deodorant.             Men may shave face and neck.  Do not bring valuables to the hospital.  [facility] is not responsible for any belongings or valuables.  Contacts, dentures or bridgework may not be worn into surgery.  Leave your suitcase in the car.  After surgery it may be brought to your room.  For patients admitted to the hospital, discharge time will be determined by your treatment team.  Patients discharged the day of surgery will not be allowed to drive home.   Special instructions:    [facility]- Preparing For Surgery  Before surgery, you can play an important role. Because skin is not sterile, your skin needs to be as free of germs as possible. You can reduce the number of germs on your skin by washing with CHG (chlorahexidine gluconate) Soap before surgery.  CHG is an antiseptic cleaner which kills germs and bonds with the skin to continue killing germs even after washing.  Please do not use if you have an allergy to CHG or antibacterial soaps. If your skin becomes reddened/irritated stop using the CHG.  Do not shave (including legs and underarms) for at least 48 hours prior to first CHG shower. It is OK to shave your face.  Please follow these instructions carefully.   1. Shower the NIGHT BEFORE SURGERY and the MORNING OF SURGERY with CHG.   2. If you chose to wash your hair, wash your hair first as usual with your normal shampoo.  3. After you shampoo, rinse your hair and body thoroughly to remove the shampoo.  4. Use CHG as you would any other liquid soap. You can apply CHG directly to the skin and wash gently with a scrungie or a clean washcloth.   5. Apply the CHG Soap to your body ONLY FROM THE NECK DOWN.  Do not use on open wounds or open sores. Avoid contact with your eyes, ears, mouth and genitals (private parts). Wash Face and genitals (private parts)  with your normal soap.  6. Wash thoroughly, paying special attention to the area where your surgery will be performed.  7. Thoroughly rinse your body with warm water from the neck down.  8. DO NOT shower/wash with your normal soap after using and rinsing off the CHG Soap.  9. Pat yourself dry with a CLEAN TOWEL.  10. Wear CLEAN PAJAMAS to bed the night before surgery, wear comfortable clothes the morning of surgery  11. Place CLEAN SHEETS on your bed the night of your first shower and DO NOT SLEEP WITH PETS.   Day of Surgery: Do not apply any deodorants/lotions. Please wear clean clothes to the  hospital/surgery center.     Please read over the following fact sheets that you were given.

## 2017-06-21 ENCOUNTER — Encounter (HOSPITAL_COMMUNITY)
Admission: RE | Disposition: A | Discharge status: Home / Self Care | Payer: Self-pay | Source: Ambulatory Visit | Attending: Orthopedic Surgery

## 2017-06-21 ENCOUNTER — Encounter (HOSPITAL_COMMUNITY): Payer: Self-pay | Admitting: *Deleted

## 2017-06-21 ENCOUNTER — Ambulatory Visit (HOSPITAL_COMMUNITY): Payer: BC Managed Care – PPO | Admitting: Certified Registered"

## 2017-06-21 ENCOUNTER — Ambulatory Visit (HOSPITAL_COMMUNITY)
Admission: RE | Admit: 2017-06-21 | Discharge: 2017-06-21 | Disposition: A | Discharge status: Home / Self Care | Payer: BC Managed Care – PPO | Source: Ambulatory Visit | Attending: Orthopedic Surgery | Admitting: Orthopedic Surgery

## 2017-06-21 DIAGNOSIS — M216X1 Other acquired deformities of right foot: Secondary | ICD-10-CM | POA: Diagnosis not present

## 2017-06-21 DIAGNOSIS — M24071 Loose body in right ankle: Secondary | ICD-10-CM | POA: Insufficient documentation

## 2017-06-21 DIAGNOSIS — Z6841 Body Mass Index (BMI) 40.0 and over, adult: Secondary | ICD-10-CM | POA: Insufficient documentation

## 2017-06-21 DIAGNOSIS — Z888 Allergy status to other drugs, medicaments and biological substances status: Secondary | ICD-10-CM | POA: Insufficient documentation

## 2017-06-21 DIAGNOSIS — Z9989 Dependence on other enabling machines and devices: Secondary | ICD-10-CM | POA: Insufficient documentation

## 2017-06-21 DIAGNOSIS — Z794 Long term (current) use of insulin: Secondary | ICD-10-CM | POA: Insufficient documentation

## 2017-06-21 DIAGNOSIS — Z79899 Other long term (current) drug therapy: Secondary | ICD-10-CM | POA: Insufficient documentation

## 2017-06-21 DIAGNOSIS — M65871 Other synovitis and tenosynovitis, right ankle and foot: Secondary | ICD-10-CM | POA: Insufficient documentation

## 2017-06-21 DIAGNOSIS — M93271 Osteochondritis dissecans, right ankle and joints of right foot: Secondary | ICD-10-CM | POA: Diagnosis not present

## 2017-06-21 DIAGNOSIS — G4730 Sleep apnea, unspecified: Secondary | ICD-10-CM | POA: Insufficient documentation

## 2017-06-21 DIAGNOSIS — I10 Essential (primary) hypertension: Secondary | ICD-10-CM | POA: Insufficient documentation

## 2017-06-21 DIAGNOSIS — G8929 Other chronic pain: Secondary | ICD-10-CM | POA: Insufficient documentation

## 2017-06-21 DIAGNOSIS — E119 Type 2 diabetes mellitus without complications: Secondary | ICD-10-CM | POA: Insufficient documentation

## 2017-06-21 DIAGNOSIS — E785 Hyperlipidemia, unspecified: Secondary | ICD-10-CM | POA: Insufficient documentation

## 2017-06-21 DIAGNOSIS — M25571 Pain in right ankle and joints of right foot: Secondary | ICD-10-CM | POA: Insufficient documentation

## 2017-06-21 DIAGNOSIS — Z87442 Personal history of urinary calculi: Secondary | ICD-10-CM | POA: Insufficient documentation

## 2017-06-21 DIAGNOSIS — Z7984 Long term (current) use of oral hypoglycemic drugs: Secondary | ICD-10-CM | POA: Insufficient documentation

## 2017-06-21 HISTORY — PX: ANKLE ARTHROSCOPY: SHX545

## 2017-06-21 LAB — GLUCOSE, CAPILLARY
GLUCOSE-CAPILLARY: 163 mg/dL — AB (ref 65–99)
GLUCOSE-CAPILLARY: 123 mg/dL — AB (ref 65–99)

## 2017-06-21 SURGERY — ARTHROSCOPY, ANKLE
Anesthesia: General | Laterality: Right

## 2017-06-21 MED ORDER — OXYCODONE-ACETAMINOPHEN 5-325 MG PO TABS
1.0000 | ORAL_TABLET | Freq: Once | ORAL | Status: AC
  Administered 2017-06-21: 2 via ORAL

## 2017-06-21 MED ORDER — PROPOFOL 10 MG/ML IV BOLUS
INTRAVENOUS | Status: AC
  Filled 2017-06-21: qty 20

## 2017-06-21 MED ORDER — ONDANSETRON HCL 4 MG/2ML IJ SOLN
INTRAMUSCULAR | Status: DC | PRN
  Administered 2017-06-21: 4 mg via INTRAVENOUS

## 2017-06-21 MED ORDER — OXYCODONE-ACETAMINOPHEN 5-325 MG PO TABS: 1.0000 | ORAL_TABLET | ORAL | 0 refills | Status: DC | PRN

## 2017-06-21 MED ORDER — LACTATED RINGERS IV SOLN
INTRAVENOUS | Status: DC
  Administered 2017-06-21: 08:00:00 via INTRAVENOUS

## 2017-06-21 MED ORDER — MIDAZOLAM HCL 5 MG/5ML IJ SOLN
INTRAMUSCULAR | Status: DC | PRN
  Administered 2017-06-21: 2 mg via INTRAVENOUS

## 2017-06-21 MED ORDER — CLINDAMYCIN PHOSPHATE 900 MG/50ML IV SOLN
900.0000 mg | INTRAVENOUS | Status: AC
  Administered 2017-06-21: 900 mg via INTRAVENOUS
  Filled 2017-06-21: qty 50

## 2017-06-21 MED ORDER — PROPOFOL 10 MG/ML IV BOLUS
INTRAVENOUS | Status: DC | PRN
  Administered 2017-06-21: 200 mg via INTRAVENOUS

## 2017-06-21 MED ORDER — MIDAZOLAM HCL 2 MG/2ML IJ SOLN
INTRAMUSCULAR | Status: AC
  Filled 2017-06-21: qty 2

## 2017-06-21 MED ORDER — OXYCODONE-ACETAMINOPHEN 5-325 MG PO TABS
ORAL_TABLET | ORAL | Status: AC
  Administered 2017-06-21: 2 via ORAL
  Filled 2017-06-21: qty 2

## 2017-06-21 MED ORDER — HYDROMORPHONE HCL 1 MG/ML IJ SOLN
0.2500 mg | INTRAMUSCULAR | Status: DC | PRN
  Administered 2017-06-21 (×2): 0.25 mg via INTRAVENOUS
  Administered 2017-06-21: 0.5 mg via INTRAVENOUS

## 2017-06-21 MED ORDER — OXYCODONE HCL 5 MG PO TABS: 5.0000 mg | ORAL_TABLET | Freq: Once | ORAL | Status: DC | PRN

## 2017-06-21 MED ORDER — CHLORHEXIDINE GLUCONATE 4 % EX LIQD: 60.0000 mL | Freq: Once | CUTANEOUS | Status: DC

## 2017-06-21 MED ORDER — LIDOCAINE 2% (20 MG/ML) 5 ML SYRINGE
INTRAMUSCULAR | Status: DC | PRN
  Administered 2017-06-21: 100 mg via INTRAVENOUS

## 2017-06-21 MED ORDER — FENTANYL CITRATE (PF) 250 MCG/5ML IJ SOLN
INTRAMUSCULAR | Status: AC
  Filled 2017-06-21: qty 5

## 2017-06-21 MED ORDER — PROMETHAZINE HCL 25 MG/ML IJ SOLN: 6.2500 mg | INTRAMUSCULAR | Status: DC | PRN

## 2017-06-21 MED ORDER — HYDROMORPHONE HCL 1 MG/ML IJ SOLN
INTRAMUSCULAR | Status: AC
  Administered 2017-06-21: 0.25 mg via INTRAVENOUS
  Filled 2017-06-21: qty 1

## 2017-06-21 MED ORDER — FENTANYL CITRATE (PF) 100 MCG/2ML IJ SOLN
INTRAMUSCULAR | Status: DC | PRN
  Administered 2017-06-21 (×3): 50 ug via INTRAVENOUS

## 2017-06-21 MED ORDER — OXYCODONE HCL 5 MG/5ML PO SOLN: 5.0000 mg | Freq: Once | ORAL | Status: DC | PRN

## 2017-06-21 MED ORDER — MEPERIDINE HCL 25 MG/ML IJ SOLN: 6.2500 mg | INTRAMUSCULAR | Status: DC | PRN

## 2017-06-21 SURGICAL SUPPLY — 40 items
BLADE CUDA 5.5 (BLADE) IMPLANT
BLADE GREAT WHITE 4.2 (BLADE) IMPLANT
BLADE SHAVER 5.5 INCISOR PLUS (BLADE) ×2 IMPLANT
BNDG COHESIVE 6X5 TAN STRL LF (GAUZE/BANDAGES/DRESSINGS) ×2 IMPLANT
BNDG GAUZE ELAST 4 BULKY (GAUZE/BANDAGES/DRESSINGS) ×2 IMPLANT
BUR OVAL 4.0 (BURR) IMPLANT
COVER SURGICAL LIGHT HANDLE (MISCELLANEOUS) ×2 IMPLANT
CUFF TOURNIQUET SINGLE 34IN LL (TOURNIQUET CUFF) IMPLANT
CUFF TOURNIQUET SINGLE 44IN (TOURNIQUET CUFF) IMPLANT
DRAPE ARTHROSCOPY W/POUCH 114 (DRAPES) ×2 IMPLANT
DRAPE C-ARM MINI 42X72 WSTRAPS (DRAPES) IMPLANT
DRAPE U-SHAPE 47X51 STRL (DRAPES) ×2 IMPLANT
DRSG EMULSION OIL 3X3 NADH (GAUZE/BANDAGES/DRESSINGS) ×2 IMPLANT
DRSG PAD ABDOMINAL 8X10 ST (GAUZE/BANDAGES/DRESSINGS) ×2 IMPLANT
DURAPREP 26ML APPLICATOR (WOUND CARE) ×2 IMPLANT
GAUZE SPONGE 4X4 12PLY STRL (GAUZE/BANDAGES/DRESSINGS) ×2 IMPLANT
GLOVE BIOGEL PI IND STRL 9 (GLOVE) ×1 IMPLANT
GLOVE BIOGEL PI INDICATOR 9 (GLOVE) ×1
GLOVE SURG ORTHO 9.0 STRL STRW (GLOVE) ×2 IMPLANT
GOWN STRL REUS W/ TWL XL LVL3 (GOWN DISPOSABLE) ×3 IMPLANT
GOWN STRL REUS W/TWL XL LVL3 (GOWN DISPOSABLE) ×3
KIT BASIN OR (CUSTOM PROCEDURE TRAY) ×2 IMPLANT
KIT ROOM TURNOVER OR (KITS) ×2 IMPLANT
MANIFOLD NEPTUNE II (INSTRUMENTS) ×2 IMPLANT
PACK ARTHROSCOPY DSU (CUSTOM PROCEDURE TRAY) ×2 IMPLANT
PAD ABD 8X10 STRL (GAUZE/BANDAGES/DRESSINGS) ×2 IMPLANT
PAD ARMBOARD 7.5X6 YLW CONV (MISCELLANEOUS) ×4 IMPLANT
PADDING CAST COTTON 6X4 STRL (CAST SUPPLIES) ×2 IMPLANT
PROBE BIPOLAR ATHRO 135MM 90D (MISCELLANEOUS) ×2 IMPLANT
SET ARTHROSCOPY TUBING (MISCELLANEOUS) ×1
SET ARTHROSCOPY TUBING LN (MISCELLANEOUS) ×1 IMPLANT
SPONGE LAP 4X18 X RAY DECT (DISPOSABLE) ×2 IMPLANT
SUT ETHILON 4 0 PS 2 18 (SUTURE) ×2 IMPLANT
SUT MNCRL AB 3-0 PS2 18 (SUTURE) IMPLANT
SUT VIC AB 2-0 CT1 27 (SUTURE)
SUT VIC AB 2-0 CT1 TAPERPNT 27 (SUTURE) IMPLANT
SYR 20CC LL (SYRINGE) ×2 IMPLANT
TAPE STRIPS DRAPE STRL (GAUZE/BANDAGES/DRESSINGS) IMPLANT
TOWEL OR 17X26 10 PK STRL BLUE (TOWEL DISPOSABLE) ×2 IMPLANT
WATER STERILE IRR 1000ML POUR (IV SOLUTION) ×2 IMPLANT

## 2017-06-21 NOTE — Anesthesia Postprocedure Evaluation (Signed)
Anesthesia Post Note  [name]  Procedure(s) Performed: RIGHT ANKLE ARTHROSCOPY WITH DEBRIDEMENT (Right )     Patient location during evaluation: PACU Anesthesia Type: General Level of consciousness: awake and alert Pain management: pain level controlled Vital Signs Assessment: post-procedure vital signs reviewed and stable Respiratory status: spontaneous breathing, nonlabored ventilation and respiratory function stable Cardiovascular status: blood pressure returned to baseline and stable Postop Assessment: no apparent nausea or vomiting Anesthetic complications: no    Last Vitals:  Vitals:   06/21/17 1023 06/21/17 1030  BP: 129/86   Pulse: 67 71  Resp: 12 14  Temp: 36.7 C   SpO2: 98% 98%    Last Pain:  Vitals:   06/21/17 1015  TempSrc:   PainSc: 5                  Lowella CurbWarren Ray Kevyn Boquet

## 2017-06-21 NOTE — Progress Notes (Signed)
Orthopedic Tech Progress Note Patient Details:  James [name] 03-Jun-1966 [id number]  Ortho Devices Type of Ortho Device: Prafo boot/shoe Ortho Device/Splint Interventions: Application   Post Interventions Patient Tolerated: Well Instructions Provided: Care of device   Saul FordyceJennifer C Peyton Spengler 06/21/2017, 11:06 AM

## 2017-06-21 NOTE — Anesthesia Preprocedure Evaluation (Addendum)
Anesthesia Evaluation  Patient identified by MRN, date of birth, ID band Patient awake    Reviewed: Allergy & Precautions, H&P , NPO status , Patient's Chart, lab work & pertinent test results  History of Anesthesia Complications Negative for: history of anesthetic complications  Airway Mallampati: I       Dental  (+) Teeth Intact   Pulmonary sleep apnea and Continuous Positive Airway Pressure Ventilation ,    breath sounds clear to auscultation       Cardiovascular hypertension, Pt. on medications  Rhythm:Regular Rate:Normal     Neuro/Psych    GI/Hepatic neg GERD  ,  Endo/Other  diabetes, Type 2, Oral Hypoglycemic AgentsMorbid obesity  Renal/GU      Musculoskeletal   Abdominal (+) + obese,   Peds  Hematology   Anesthesia Other Findings   Reproductive/Obstetrics                             Anesthesia Physical  Anesthesia Plan  ASA: III  Anesthesia Plan: General   Post-op Pain Management:    Induction: Intravenous  PONV Risk Score and Plan: 2 and Ondansetron and Midazolam  Airway Management Planned: LMA  Additional Equipment:   Intra-op Plan:   Post-operative Plan: Extubation in OR  Informed Consent: I have reviewed the patients History and Physical, chart, labs and discussed the procedure including the risks, benefits and alternatives for the proposed anesthesia with the patient or authorized representative who has indicated his/her understanding and acceptance.   Dental advisory given  Plan Discussed with: CRNA  Anesthesia Plan Comments:        Anesthesia Quick Evaluation

## 2017-06-21 NOTE — Progress Notes (Signed)
Orthopedic Tech Progress Note Patient Details:  James [name] [birth year] [id number]  Ortho Devices Type of Ortho Device: Prafo boot/shoe Ortho Device/Splint Interventions: Application   Post Interventions Patient Tolerated: Well Instructions Provided: Care of device   [name] 06/21/2017, 11:07 AM

## 2017-06-21 NOTE — Anesthesia Procedure Notes (Signed)
Procedure Name: LMA Insertion Date/Time: 06/21/2017 8:56 AM Performed by: Sheppard EvensManess, Jamerion Cabello B, CRNA Pre-anesthesia Checklist: Patient identified, Emergency Drugs available, Suction available and Patient being monitored Patient Re-evaluated:Patient Re-evaluated prior to induction Oxygen Delivery Method: Circle System Utilized Preoxygenation: Pre-oxygenation with 100% oxygen Induction Type: IV induction Ventilation: Mask ventilation without difficulty LMA: LMA inserted LMA Size: 5.0 Number of attempts: 1 Airway Equipment and Method: Bite block Placement Confirmation: positive ETCO2 Tube secured with: Tape Dental Injury: Teeth and Oropharynx as per pre-operative assessment

## 2017-06-21 NOTE — Transfer of Care (Signed)
Immediate Anesthesia Transfer of Care Note  [name]  Procedure(s) Performed: RIGHT ANKLE ARTHROSCOPY WITH DEBRIDEMENT (Right )  Patient Location: PACU  Anesthesia Type:General  Level of Consciousness: awake, alert  and oriented  Airway & Oxygen Therapy: Patient Spontanous Breathing and Patient connected to face mask oxygen  Post-op Assessment: Report given to RN and Post -op Vital signs reviewed and stable  Post vital signs: Reviewed and stable  Last Vitals:  Vitals:   06/21/17 0951 06/21/17 1000  BP: 131/76 133/79  Pulse: 81 81  Resp: 18 18  Temp: 36.6 C   SpO2: 97% 97%    Last Pain:  Vitals:   06/21/17 0708  TempSrc: Oral      Patients Stated Pain Goal: 3 (06/21/17 0726)  Complications: No apparent anesthesia complications

## 2017-06-21 NOTE — H&P (Signed)
James [name] is an 51 y.o. male.   Chief Complaint: Chronic right ankle pain. HPI: James [name] is a 51 year old gentleman who has pain with activities of daily living with his right ankle.  James [name] is undergone prolonged conservative therapy he has pain along the medial joint line.  MRI scan has confirmed an osteochondral defect of the medial talar dome.  Past Medical History:  Diagnosis Date  . Chicken pox   . Diabetes mellitus without complication ([place])   . History of kidney stones   . Hyperlipidemia   . Hypertension   . Sleep apnea    CiPaP at night    Past Surgical History:  Procedure Laterality Date  . CYSTOSCOPY W/ URETERAL STENT PLACEMENT  08/07/2011   Procedure: CYSTOSCOPY WITH RETROGRADE PYELOGRAM/URETERAL STENT PLACEMENT;  Surgeon: Marissa Nestle, MD;  Location: [facility];  Service: Urology;  Laterality: Left;  Cystoscopy, Left Retrograde Pyelogram, Left Ureteral Ballon Dilation, Double J Stent Placement  . MENISCUS REPAIR     left knee 2018  . SHOULDER SURGERY     bilateral    Family History  Problem Relation Age of Onset  . Prostate cancer Father   . Diabetes Father   . Anesthesia problems Neg Hx   . Hypotension Neg Hx   . Malignant hyperthermia Neg Hx   . Pseudochol deficiency Neg Hx    Social History:  reports that  has never smoked. he has never used smokeless tobacco. He reports that he does not drink alcohol or use drugs.  Allergies:  Allergies  Allergen Reactions  . Ceftriaxone Other (See Comments)    leads to increased photosensitivity, per hospital ([facility]) 02/08/2009    Medications Prior to Admission  Medication Sig Dispense Refill  . amLODipine (NORVASC) 10 MG tablet TAKE 1 TABLET BY MOUTH ONCE DAILY AT BEDTIME (James [name] taking differently: TAKE 10 MG BY MOUTH ONCE DAILY AT BEDTIME) 90 tablet 0  . insulin degludec (TRESIBA FLEXTOUCH) 100 UNIT/ML SOPN FlexTouch Pen Inject 0.3 mLs (30 Units total) into the skin daily. (James [name] taking differently: Inject 25  Units into the skin daily. ) 27 mL 0  . lisinopril-hydrochlorothiazide (PRINZIDE,ZESTORETIC) 20-25 MG tablet TAKE ONE TABLET BY MOUTH ONCE DAILY FOR  HIGH  BLOOD  PRESSURE. *Needs to establish with new PCP for more refills* 30 tablet 0  . metFORMIN (GLUCOPHAGE) 500 MG tablet TAKE 1 TABLET BY MOUTH TWICE DAILY FOR DIABETES (James [name] taking differently: TAKE 500 MG BY MOUTH TWICE DAILY FOR DIABETES) 180 tablet 0  . NOVOLOG FLEXPEN 100 UNIT/ML FlexPen INJECT UP TO 30 UNITS THREE TIMES A DAY WITH MEALS (James [name] taking differently: Inject up to 30 units SQ twice daily as needed for high blood sugar) 30 mL 6  . simvastatin (ZOCOR) 20 MG tablet TAKE 1 TABLET BY MOUTH AT BEDTIME FOR  CHOLESTEROL (James [name] taking differently: TAKE 20 MG BY MOUTH AT BEDTIME FOR  CHOLESTEROL) 90 tablet 0  . glucose blood (ONE TOUCH ULTRA TEST) test strip 1 each by Other route 4 (four) times daily. Use as instructed (James [name] not taking: Reported on 06/18/2017) 100 each 3    Results for orders placed or performed during the hospital encounter of 06/20/17 (from the past 48 hour(s))  Basic metabolic panel     Status: Abnormal   Collection Time: 06/20/17  2:23 PM  Result Value Ref Range   Sodium 136 135 - 145 mmol/L   Potassium 3.8 3.5 - 5.1 mmol/L   Chloride 101 101 - 111 mmol/L   CO2  26 22 - 32 mmol/L   Glucose, Bld 218 (H) 65 - 99 mg/dL   BUN 24 (H) 6 - 20 mg/dL   Creatinine, Ser 1.02 0.61 - 1.24 mg/dL   Calcium 9.9 8.9 - 10.3 mg/dL   GFR calc non Af Amer >60 >60 mL/min   GFR calc Af Amer >60 >60 mL/min    Comment: (NOTE) The eGFR has been calculated using the CKD EPI equation. This calculation has not been validated in all clinical situations. eGFR's persistently <60 mL/min signify possible Chronic Kidney Disease.    Anion gap 9 5 - 15  CBC     Status: Abnormal   Collection Time: 06/20/17  2:23 PM  Result Value Ref Range   WBC 9.3 4.0 - 10.5 K/uL   RBC 5.20 4.22 - 5.81 MIL/uL   Hemoglobin 15.8 13.0 - 17.0 g/dL    HCT 43.8 39.0 - 52.0 %   MCV 84.2 78.0 - 100.0 fL   MCH 30.4 26.0 - 34.0 pg   MCHC 36.1 (H) 30.0 - 36.0 g/dL   RDW 13.7 11.5 - 15.5 %   Platelets 207 150 - 400 K/uL  Hemoglobin A1c     Status: Abnormal   Collection Time: 06/20/17  2:24 PM  Result Value Ref Range   Hgb A1c MFr Bld 9.9 (H) 4.8 - 5.6 %    Comment: (NOTE) Pre diabetes:          5.7%-6.4% Diabetes:              >6.4% Glycemic control for   <7.0% adults with diabetes    Mean Plasma Glucose 237.43 mg/dL  Glucose, capillary     Status: Abnormal   Collection Time: 06/20/17  2:43 PM  Result Value Ref Range   Glucose-Capillary 195 (H) 65 - 99 mg/dL   No results found.  Review of Systems  All other systems reviewed and are negative.   There were no vitals taken for this visit. Physical Exam  On examination James [name] is alert oriented no adenopathy we will will affect normal respiratory effort has a normal gait.  He has a good dorsalis pedis pulse he has pain with maximum plantarflexion of the foot he has pain to palpation of the medial joint line.  Review of the MRI scan shows an osteochondral defect of the medial talar dome. 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  Plan: We will plan for arthroscopic debridement with microfracture.  Risk and benefits were discussed including persistent pain.  James [name] states he understands wished to proceed at this time.  Newt Minion, MD 06/21/2017, 6:59 AM

## 2017-06-21 NOTE — Op Note (Signed)
06/21/2017  9:53 AM  PATIENT:  James HaiEdmund D Figg    PRE-OPERATIVE DIAGNOSIS:  Osteochondral Defect Right Medial Talus  POST-OPERATIVE DIAGNOSIS:  Same  PROCEDURE:  RIGHT ANKLE ARTHROSCOPY WITH DEBRIDEMENT  SURGEON:  Nadara MustardMarcus V Duda, MD  PHYSICIAN ASSISTANT:None ANESTHESIA:   General  PREOPERATIVE INDICATIONS:  James [name] is a  51 y.o. male with a diagnosis of Osteochondral Defect Right Medial Talus who failed conservative measures and elected for surgical management.    The risks benefits and alternatives were discussed with the patient preoperatively including but not limited to the risks of infection, bleeding, nerve injury, cardiopulmonary complications, the need for revision surgery, among others, and the patient was willing to proceed.  OPERATIVE IMPLANTS: None.  OPERATIVE FINDINGS: Large osteochondral defect of the medial talar dome with multiple loose bodies in the medial gutter.  OPERATIVE PROCEDURE: Patient was brought to the operating room and underwent a general anesthetic.  After adequate levels of anesthesia were obtained patient's right lower extremity was prepped using DuraPrep draped into a sterile field a timeout was called.  The arthroscopy working portal was established anterior medially and anterior laterally and the equipment was exchanged through these portals for visualization.  Emanation patient had significant synovitis impinging into the ankle joint.  Patient underwent extensive synovectomy of the medial and lateral gutters as well as anteriorly.  There are multiple loose bodies approximately 10 mm in diameter in the medial gutter and these were removed with the shaver and the grabber.  Patient had a large osteochondral flap of the medial talar dome shaver was used to debride the microfracture tools were used for microfracture.  The electrocautery wand was used for hemostasis.  The ends was removed the portals were closed using 3-0 nylon the wounds were covered  with Adaptic 4 x 4's ABD Kerlix and Coban.  Plan for discharge to home.   DISCHARGE PLANNING:  Antibiotic duration: Perioperatively.  Weightbearing: Touchdown weightbearing on the right.  Pain medication: Prescription provided for Percocet for pain.  Dressing care/ Wound VAC: Leave wound dressing dry and intact for 5 days.  Ambulatory devices: Crutches for touchdown weightbearing.  Discharge to: Home.  Follow-up: In the office 1 week post operative.

## 2017-06-26 ENCOUNTER — Encounter (HOSPITAL_COMMUNITY): Payer: Self-pay | Admitting: Orthopedic Surgery

## 2017-06-27 ENCOUNTER — Telehealth (INDEPENDENT_AMBULATORY_CARE_PROVIDER_SITE_OTHER): Payer: Self-pay | Admitting: Orthopedic Surgery

## 2017-06-27 NOTE — Telephone Encounter (Signed)
I called to advise patient per Dr. Audrie Liauda's orders patient is to be touch down weightbearing only, using crutches and cannot be weightbearing as tolerated at this time and should wait until 1st post operative appointment to do anything different.

## 2017-06-27 NOTE — Telephone Encounter (Signed)
Patient called wondering if he could put weight on his ankle as tolerated? Please advise # [phone]

## 2017-07-03 ENCOUNTER — Encounter (INDEPENDENT_AMBULATORY_CARE_PROVIDER_SITE_OTHER): Payer: Self-pay | Admitting: Family

## 2017-07-03 ENCOUNTER — Ambulatory Visit (INDEPENDENT_AMBULATORY_CARE_PROVIDER_SITE_OTHER): Payer: BC Managed Care – PPO | Admitting: Family

## 2017-07-03 DIAGNOSIS — G8929 Other chronic pain: Secondary | ICD-10-CM

## 2017-07-03 DIAGNOSIS — M25571 Pain in right ankle and joints of right foot: Secondary | ICD-10-CM

## 2017-07-03 DIAGNOSIS — M93271 Osteochondritis dissecans, right ankle and joints of right foot: Secondary | ICD-10-CM

## 2017-07-03 NOTE — Progress Notes (Signed)
   Post-Op Visit Note   [name]           Date of Birth: [birth year]           MRN: [id number] Visit Date: 07/03/2017 PCP: Patient, No Pcp Per  Chief Complaint:  Chief Complaint  Patient presents with  . Right Ankle - Routine Post Op    HPI:  HPI Patient is a 52 year old gentleman seen 2 weeks status post right ankle arthroscopy with debridement. States feels better than pre op.  Ortho Exam Portals are clean and dry. Well healed. Moderate swelling to ankle. No erythema. To pain with rom of ankle. No impingement symptoms.  Visit Diagnoses: No diagnosis found.  Plan: follow up in office in 2 weeks. Anticipate releasing to work at that time. May WBAT.  Follow-Up Instructions: No Follow-up on file.   Imaging: No results found.  Orders:  No orders of the defined types were placed in this encounter.  No orders of the defined types were placed in this encounter.    PMFS History: Patient Active Problem List   Diagnosis Date Noted  . Osteochondritis dissecans of right talus   . Essential hypertension, benign 06/08/2016  . Routine general medical examination at a health care facility 04/12/2016  . Morbid obesity (HCC) 04/12/2016  . Type 2 diabetes mellitus, uncontrolled (HCC) 03/23/2015  . Rash and nonspecific skin eruption 03/23/2015  . Obstructive apnea 04/21/2014   Past Medical History:  Diagnosis Date  . Chicken pox   . Diabetes mellitus without complication (HCC)   . History of kidney stones   . Hyperlipidemia   . Hypertension   . Sleep apnea    CiPaP at night    Family History  Problem Relation Age of Onset  . Prostate cancer Father   . Diabetes Father   . Anesthesia problems Neg Hx   . Hypotension Neg Hx   . Malignant hyperthermia Neg Hx   . Pseudochol deficiency Neg Hx     Past Surgical History:  Procedure Laterality Date  . ANKLE ARTHROSCOPY Right 06/21/2017   Procedure: RIGHT ANKLE ARTHROSCOPY WITH DEBRIDEMENT;  Surgeon: Nadara Mustarduda,  Marcus V, MD;  [facility];  Service: Orthopedics;  Laterality: Right;  . CYSTOSCOPY W/ URETERAL STENT PLACEMENT  08/07/2011   Procedure: CYSTOSCOPY WITH RETROGRADE PYELOGRAM/URETERAL STENT PLACEMENT;  Surgeon: Ky BarbanMohammad I Javaid, MD;  Location: [facility];  Service: Urology;  Laterality: Left;  Cystoscopy, Left Retrograde Pyelogram, Left Ureteral Ballon Dilation, Double J Stent Placement  . MENISCUS REPAIR     left knee 2018  . SHOULDER SURGERY     bilateral   Social History   Occupational History  . Occupation: Data processing managerMaintenance Mechanic  Tobacco Use  . Smoking status: Never Smoker  . Smokeless tobacco: Never Used  Substance and Sexual Activity  . Alcohol use: No  . Drug use: No  . Sexual activity: Not on file

## 2017-07-17 ENCOUNTER — Ambulatory Visit (INDEPENDENT_AMBULATORY_CARE_PROVIDER_SITE_OTHER): Payer: BC Managed Care – PPO | Admitting: Family

## 2017-07-18 ENCOUNTER — Encounter (INDEPENDENT_AMBULATORY_CARE_PROVIDER_SITE_OTHER): Payer: Self-pay | Admitting: Family

## 2017-07-18 ENCOUNTER — Ambulatory Visit (INDEPENDENT_AMBULATORY_CARE_PROVIDER_SITE_OTHER): Payer: BC Managed Care – PPO | Admitting: Orthopedic Surgery

## 2017-07-18 DIAGNOSIS — M93271 Osteochondritis dissecans, right ankle and joints of right foot: Secondary | ICD-10-CM

## 2017-07-18 NOTE — Progress Notes (Signed)
Office Visit Note   [name]           Date of Birth: [birth year]           MRN: [id number] Visit Date: 07/18/2017              Requested by: No referring provider defined for this encounter. PCP: Patient, No Pcp Per  Chief Complaint  Patient presents with  . Right Ankle - Routine Post Op    06/18/17 Right Ankle Arthroscopy and Debridement      HPI: Patient presents in follow-up 1 month status post right ankle arthroscopy with debridement.  Patient states his ankle is doing much better he states he has good subtalar motion and his ankle motion is improving he is working on stretching.  Patient states he occasionally has some numbness first webspace and states he feels like he does not have as much dorsiflexion of his toes as normal.  Assessment & Plan: Visit Diagnoses:  1. Osteochondritis dissecans of right talus     Plan: He will continue on heel cord stretching recommended compression to decrease the swelling increase his activities as tolerated without restrictions follow-up as needed.  Follow-Up Instructions: Return if symptoms worsen or fail to improve.   Ortho Exam  Patient is alert, oriented, no adenopathy, well-dressed, normal affect, normal respiratory effort. Examination patient does have subjective numbness in the first webspace most likely due to swelling.  He has active plantar flexion and extension of all toes but he has decreased extension but the extensor mechanisms are intact.  He does have heel cord tightness with dorsiflexion to neutral.  Imaging: No results found. No images are attached to the encounter.  Labs: Lab Results  Component Value Date   HGBA1C 9.9 (H) 06/20/2017   HGBA1C 9.3 03/05/2017   HGBA1C 8.8 (H) 06/08/2016    @LABSALLVALUES (HGBA1)@  There is no height or weight on file to calculate BMI.  Orders:  No orders of the defined types were placed in this encounter.  No orders of the defined types were placed in this  encounter.    Procedures: No procedures performed  Clinical Data: No additional findings.  ROS:  All other systems negative, except as noted in the HPI. Review of Systems  Objective: Vital Signs: There were no vitals taken for this visit.  Specialty Comments:  No specialty comments available.  PMFS History: Patient Active Problem List   Diagnosis Date Noted  . Osteochondritis dissecans of right talus   . Essential hypertension, benign 06/08/2016  . Routine general medical examination at a health care facility 04/12/2016  . Morbid obesity (HCC) 04/12/2016  . Type 2 diabetes mellitus, uncontrolled (HCC) 03/23/2015  . Rash and nonspecific skin eruption 03/23/2015  . Obstructive apnea 04/21/2014   Past Medical History:  Diagnosis Date  . Chicken pox   . Diabetes mellitus without complication (H[facility])   . History of kidney stones   . Hyperlipidemia   . Hypertension   . Sleep apnea    CiPaP at night    Family History  Problem Relation Age of Onset  . Prostate cancer Father   . Diabetes Father   . Anesthesia problems Neg Hx   . Hypotension Neg Hx   . Malignant hyperthermia Neg Hx   . Pseudochol deficiency Neg Hx     Past Surgical History:  Procedure Laterality Date  . ANKLE ARTHROSCOPY Right 06/21/2017   Procedure: RIGHT ANKLE ARTHROSCOPY WITH DEBRIDEMENT;  Surgeon: Nadara Mustard, MD;  Location: [facility];  Service: Orthopedics;  Laterality: Right;  . CYSTOSCOPY W/ URETERAL STENT PLACEMENT  08/07/2011   Procedure: CYSTOSCOPY WITH RETROGRADE PYELOGRAM/URETERAL STENT PLACEMENT;  Surgeon: Ky BarbanMohammad I Javaid, MD;  Location: [facility];  Service: Urology;  Laterality: Left;  Cystoscopy, Left Retrograde Pyelogram, Left Ureteral Ballon Dilation, Double J Stent Placement  . MENISCUS REPAIR     left knee 2018  . SHOULDER SURGERY     bilateral   Social History   Occupational History  . Occupation: Data processing managerMaintenance Mechanic  Tobacco Use  . Smoking status: Never Smoker  . Smokeless  tobacco: Never Used  Substance and Sexual Activity  . Alcohol use: No  . Drug use: No  . Sexual activity: Not on file

## 2017-11-11 ENCOUNTER — Encounter: Payer: BC Managed Care – PPO | Admitting: Family

## 2017-12-05 ENCOUNTER — Ambulatory Visit: Payer: BC Managed Care – PPO | Admitting: Family

## 2017-12-05 ENCOUNTER — Encounter: Payer: Self-pay | Admitting: Family

## 2017-12-05 VITALS — BP 148/90 | HR 82 | Temp 98.4°F | Ht 67.0 in | Wt 284.1 lb

## 2017-12-05 DIAGNOSIS — I10 Essential (primary) hypertension: Secondary | ICD-10-CM | POA: Diagnosis not present

## 2017-12-05 DIAGNOSIS — Z794 Long term (current) use of insulin: Secondary | ICD-10-CM

## 2017-12-05 DIAGNOSIS — F909 Attention-deficit hyperactivity disorder, unspecified type: Secondary | ICD-10-CM

## 2017-12-05 DIAGNOSIS — Z23 Encounter for immunization: Secondary | ICD-10-CM | POA: Diagnosis not present

## 2017-12-05 DIAGNOSIS — E1165 Type 2 diabetes mellitus with hyperglycemia: Secondary | ICD-10-CM

## 2017-12-05 DIAGNOSIS — Z1211 Encounter for screening for malignant neoplasm of colon: Secondary | ICD-10-CM

## 2017-12-05 DIAGNOSIS — IMO0001 Reserved for inherently not codable concepts without codable children: Secondary | ICD-10-CM

## 2017-12-05 MED ORDER — METFORMIN HCL 500 MG PO TABS: ORAL_TABLET | ORAL | 0 refills | Status: DC

## 2017-12-05 MED ORDER — GLUCOSE BLOOD VI STRP: 1.0000 | ORAL_STRIP | Freq: Three times a day (TID) | 3 refills | Status: DC

## 2017-12-05 MED ORDER — ATORVASTATIN CALCIUM 20 MG PO TABS: 20.0000 mg | ORAL_TABLET | Freq: Every day | ORAL | 1 refills | Status: DC

## 2017-12-05 MED ORDER — INSULIN ASPART 100 UNIT/ML FLEXPEN: PEN_INJECTOR | SUBCUTANEOUS | 6 refills | Status: DC

## 2017-12-05 MED ORDER — LISINOPRIL-HYDROCHLOROTHIAZIDE 20-25 MG PO TABS: ORAL_TABLET | ORAL | 1 refills | Status: DC

## 2017-12-05 MED ORDER — AMLODIPINE BESYLATE 10 MG PO TABS: 10.0000 mg | ORAL_TABLET | Freq: Every day | ORAL | 1 refills | Status: DC

## 2017-12-05 MED ORDER — INSULIN DEGLUDEC 100 UNIT/ML ~~LOC~~ SOPN: 25.0000 [IU] | PEN_INJECTOR | Freq: Every day | SUBCUTANEOUS | 1 refills | Status: DC

## 2017-12-05 NOTE — Progress Notes (Signed)
James [name] is a 52 y.o. male with the following history as recorded in Epic[employer]:  Patient Active Problem List   Diagnosis Date Noted  . Osteochondritis dissecans of right talus   . Essential hypertension, benign 06/08/2016  . Routine general medical examination at a health care facility 04/12/2016  . Morbid obesity (HCC) 04/12/2016  . Type 2 diabetes mellitus, uncontrolled (HCC) 03/23/2015  . Rash and nonspecific skin eruption 03/23/2015  . Obstructive apnea 04/21/2014    Current Outpatient Medications  Medication Sig Dispense Refill  . amLODipine (NORVASC) 10 MG tablet Take 1 tablet (10 mg total) by mouth at bedtime. 90 tablet 1  . insulin aspart (NOVOLOG FLEXPEN) 100 UNIT/ML FlexPen Inject up to 30 units SQ twice daily as needed for high blood sugar 30 mL 6  . insulin degludec (TRESIBA FLEXTOUCH) 100 UNIT/ML SOPN FlexTouch Pen Inject 0.25 mLs (25 Units total) into the skin daily. 5 pen 1  . lisinopril-hydrochlorothiazide (PRINZIDE,ZESTORETIC) 20-25 MG tablet TAKE ONE TABLET BY MOUTH ONCE DAILY FOR  HIGH  BLOOD  PRESSURE. 90 tablet 1  . metFORMIN (GLUCOPHAGE) 500 MG tablet TAKE 1 TABLET BY MOUTH TWICE DAILY FOR DIABETES 180 tablet 0  . atorvastatin (LIPITOR) 20 MG tablet Take 1 tablet (20 mg total) by mouth daily. 90 tablet 1  . glucose blood (ONE TOUCH ULTRA TEST) test strip 1 each by Other route 3 (three) times daily. Use as instructed 100 each 3   No current facility-administered medications for this visit.     Allergies: Ceftriaxone  Past Medical History:  Diagnosis Date  . Chicken pox   . Diabetes mellitus without complication (HCC)   . History of kidney stones   . Hyperlipidemia   . Hypertension   . Sleep apnea    CiPaP at night    Past Surgical History:  Procedure Laterality Date  . ANKLE ARTHROSCOPY Right 06/21/2017   Procedure: RIGHT ANKLE ARTHROSCOPY WITH DEBRIDEMENT;  Surgeon: Nadara Mustard, MD;  Location: [facility] OR;  Service: Orthopedics;  Laterality: Right;  .  CYSTOSCOPY W/ URETERAL STENT PLACEMENT  08/07/2011   Procedure: CYSTOSCOPY WITH RETROGRADE PYELOGRAM/URETERAL STENT PLACEMENT;  Surgeon: Ky Barban, MD;  Location: [facility];  Service: Urology;  Laterality: Left;  Cystoscopy, Left Retrograde Pyelogram, Left Ureteral Ballon Dilation, Double J Stent Placement  . MENISCUS REPAIR     left knee 2018  . SHOULDER SURGERY     bilateral    Family History  Problem Relation Age of Onset  . Prostate cancer Father   . Diabetes Father   . Anesthesia problems Neg Hx   . Hypotension Neg Hx   . Malignant hyperthermia Neg Hx   . Pseudochol deficiency Neg Hx     Social History   Tobacco Use  . Smoking status: Never Smoker  . Smokeless tobacco: Never Used  Substance Use Topics  . Alcohol use: No    Subjective:  Patient presents to transfer his care from another provider who left the office; has history of hypertension/ Type 2 Diabetes/ hyperlipidemia; admits he has been out of his medications "for a while" due to complications getting refills; has been "stretching" his medications out so not taking daily as prescribed; Denies any chest pain, shortness of breath, blurred vision or headache; denies any concerns for episodes of low blood sugars; Also requesting referral to psychiatrist for updated ADHD testing/ treatment; has taken Dexadrine in the past;    Objective:  Vitals:   12/05/17 1454  BP: (!) 148/90  Pulse: 82  Temp: 98.4 F (36.9 C)  TempSrc: Oral  SpO2: 98%  Weight: 284 lb 1.3 oz (128.9 kg)  Height: 5\' 7"  (1.702 m)    General: Well developed, well nourished, in no acute distress  Skin : Warm and dry.  Head: Normocephalic and atraumatic  Eyes: Sclera and conjunctiva clear; pupils round and reactive to light; extraocular movements intact  Ears: External normal; canals clear; tympanic membranes normal  Oropharynx: Pink, supple. No suspicious lesions  Neck: Supple without thyromegaly, adenopathy  Lungs: Respirations unlabored; clear  to auscultation bilaterally without wheeze, rales, rhonchi  CVS exam: normal rate and regular rhythm.  Abdomen: Soft; nontender; nondistended; normoactive bowel sounds; no masses or hepatosplenomegaly  Musculoskeletal: No deformities; no active joint inflammation  Extremities: No edema, cyanosis, clubbing  Vessels: Symmetric bilaterally  Neurologic: Alert and oriented; speech intact; face symmetrical; moves all extremities well; CNII-XII intact without focal deficit  Assessment:  1. Essential hypertension, benign   2. Uncontrolled type 2 diabetes mellitus without complication, with long-term current use of insulin (HCC)   3. Attention deficit hyperactivity disorder (ADHD), unspecified ADHD type   4. Encounter for screening colonoscopy     Plan:  1. Uncontrolled; refills updated; follow-up in 1 month; 2. Refills updated; plan to update labs at next OV; change Zocor to Lipitor 20 mg; start Baby Aspirin 81 mg daily; 3. Refer to behavioral health; 4. Refer for baseline colonosocpy;   Return in about 1 month (around 01/04/2018) for for follow-up.  Orders Placed This Encounter  Procedures  . Pneumococcal conjugate vaccine 13-valent  . Ambulatory referral to Psychiatry    Referral Priority:   Routine    Referral Type:   Psychiatric    Referral Reason:   Specialty Services Required    Requested Specialty:   Psychiatry    Number of Visits Requested:   1  . Ambulatory referral to Gastroenterology    Referral Priority:   Routine    Referral Type:   Consultation    Referral Reason:   Specialty Services Required    Number of Visits Requested:   1    Requested Prescriptions   Signed Prescriptions Disp Refills  . amLODipine (NORVASC) 10 MG tablet 90 tablet 1    Sig: Take 1 tablet (10 mg total) by mouth at bedtime.  Marland Kitchen. lisinopril-hydrochlorothiazide (PRINZIDE,ZESTORETIC) 20-25 MG tablet 90 tablet 1    Sig: TAKE ONE TABLET BY MOUTH ONCE DAILY FOR  HIGH  BLOOD  PRESSURE.  Marland Kitchen. atorvastatin  (LIPITOR) 20 MG tablet 90 tablet 1    Sig: Take 1 tablet (20 mg total) by mouth daily.  . insulin degludec (TRESIBA FLEXTOUCH) 100 UNIT/ML SOPN FlexTouch Pen 5 pen 1    Sig: Inject 0.25 mLs (25 Units total) into the skin daily.  . metFORMIN (GLUCOPHAGE) 500 MG tablet 180 tablet 0    Sig: TAKE 1 TABLET BY MOUTH TWICE DAILY FOR DIABETES  . insulin aspart (NOVOLOG FLEXPEN) 100 UNIT/ML FlexPen 30 mL 6    Sig: Inject up to 30 units SQ twice daily as needed for high blood sugar  . glucose blood (ONE TOUCH ULTRA TEST) test strip 100 each 3    Sig: 1 each by Other route 3 (three) times daily. Use as instructed

## 2017-12-06 ENCOUNTER — Encounter: Payer: Self-pay | Admitting: Gastroenterology

## 2018-01-09 ENCOUNTER — Encounter: Payer: Self-pay | Admitting: Family

## 2018-01-09 ENCOUNTER — Ambulatory Visit: Payer: BC Managed Care – PPO | Admitting: Family

## 2018-01-09 ENCOUNTER — Other Ambulatory Visit (INDEPENDENT_AMBULATORY_CARE_PROVIDER_SITE_OTHER): Payer: BC Managed Care – PPO

## 2018-01-09 VITALS — BP 140/76 | HR 73 | Temp 97.8°F | Ht 67.0 in | Wt 285.1 lb

## 2018-01-09 DIAGNOSIS — Z125 Encounter for screening for malignant neoplasm of prostate: Secondary | ICD-10-CM

## 2018-01-09 DIAGNOSIS — E785 Hyperlipidemia, unspecified: Secondary | ICD-10-CM

## 2018-01-09 DIAGNOSIS — I10 Essential (primary) hypertension: Secondary | ICD-10-CM

## 2018-01-09 DIAGNOSIS — E1165 Type 2 diabetes mellitus with hyperglycemia: Secondary | ICD-10-CM

## 2018-01-09 LAB — COMPREHENSIVE METABOLIC PANEL
ALT: 17 U/L (ref 0–53)
AST: 14 U/L (ref 0–37)
Albumin: 4.4 g/dL (ref 3.5–5.2)
Alkaline Phosphatase: 76 U/L (ref 39–117)
BUN: 18 mg/dL (ref 6–23)
CHLORIDE: 99 meq/L (ref 96–112)
CO2: 31 meq/L (ref 19–32)
Calcium: 9.6 mg/dL (ref 8.4–10.5)
Creatinine, Ser: 0.98 mg/dL (ref 0.40–1.50)
GFR: 85.97 mL/min (ref >60.00)
GLUCOSE: 306 mg/dL — AB (ref 70–99)
POTASSIUM: 4.4 meq/L (ref 3.5–5.1)
SODIUM: 137 meq/L (ref 135–145)
TOTAL PROTEIN: 6.6 g/dL (ref 6.0–8.3)
Total Bilirubin: 1.1 mg/dL (ref 0.2–1.2)

## 2018-01-09 LAB — LIPID PANEL
CHOLESTEROL: 126 mg/dL (ref 0–200)
HDL: 39.4 mg/dL (ref >39.00)
LDL CALC: 63 mg/dL (ref 0–99)
NonHDL: 87.03
Total CHOL/HDL Ratio: 3
Triglycerides: 120 mg/dL (ref 0.0–149.0)
VLDL: 24 mg/dL (ref 0.0–40.0)

## 2018-01-09 LAB — CBC WITH DIFFERENTIAL/PLATELET
BASOS PCT: 0.9 % (ref 0.0–3.0)
Basophils Absolute: 0.1 10*3/uL (ref 0.0–0.1)
EOS ABS: 0.2 10*3/uL (ref 0.0–0.7)
Eosinophils Relative: 2.9 % (ref 0.0–5.0)
HCT: 41.8 % (ref 39.0–52.0)
HEMOGLOBIN: 14.6 g/dL (ref 13.0–17.0)
Lymphocytes Relative: 27.7 % (ref 12.0–46.0)
Lymphs Abs: 2 10*3/uL (ref 0.7–4.0)
MCHC: 34.9 g/dL (ref 30.0–36.0)
MCV: 86.3 fl (ref 78.0–100.0)
MONO ABS: 0.4 10*3/uL (ref 0.1–1.0)
Monocytes Relative: 5.5 % (ref 3.0–12.0)
Neutro Abs: 4.5 10*3/uL (ref 1.4–7.7)
Neutrophils Relative %: 63 % (ref 43.0–77.0)
Platelets: 232 10*3/uL (ref 150.0–400.0)
RBC: 4.84 Mil/uL (ref 4.22–5.81)
RDW: 14.6 % (ref 11.5–15.5)
WBC: 7.2 10*3/uL (ref 4.0–10.5)

## 2018-01-09 LAB — HEMOGLOBIN A1C: HEMOGLOBIN A1C: 8.8 % — AB (ref 4.6–6.5)

## 2018-01-09 LAB — PSA: PSA: 0.88 ng/mL (ref 0.10–4.00)

## 2018-01-09 MED ORDER — LISINOPRIL-HYDROCHLOROTHIAZIDE 20-12.5 MG PO TABS: 2.0000 | ORAL_TABLET | Freq: Every day | ORAL | 1 refills | Status: DC

## 2018-01-09 NOTE — Progress Notes (Signed)
James [name] is a 52 y.o. male with the following history as recorded in EpicCare:  Patient Active Problem List   Diagnosis Date Noted  . Osteochondritis dissecans of right talus   . Essential hypertension, benign 06/08/2016  . Routine general medical examination at a health care facility 04/12/2016  . Morbid obesity ([place]) 04/12/2016  . Type 2 diabetes mellitus, uncontrolled ([place]) 03/23/2015  . Rash and nonspecific skin eruption 03/23/2015  . Obstructive apnea 04/21/2014    Current Outpatient Medications  Medication Sig Dispense Refill  . amLODipine (NORVASC) 10 MG tablet Take 1 tablet (10 mg total) by mouth at bedtime. 90 tablet 1  . aspirin EC 81 MG tablet Take 81 mg by mouth daily.    Marland Kitchen atorvastatin (LIPITOR) 20 MG tablet Take 1 tablet (20 mg total) by mouth daily. 90 tablet 1  . glucose blood (ONE TOUCH ULTRA TEST) test strip 1 each by Other route 3 (three) times daily. Use as instructed 100 each 3  . insulin aspart (NOVOLOG FLEXPEN) 100 UNIT/ML FlexPen Inject up to 30 units SQ twice daily as needed for high blood sugar 30 mL 6  . insulin degludec (TRESIBA FLEXTOUCH) 100 UNIT/ML SOPN FlexTouch Pen Inject 0.25 mLs (25 Units total) into the skin daily. 5 pen 1  . metFORMIN (GLUCOPHAGE) 500 MG tablet TAKE 1 TABLET BY MOUTH TWICE DAILY FOR DIABETES 180 tablet 0  . lisinopril-hydrochlorothiazide (ZESTORETIC) 20-12.5 MG tablet Take 2 tablets by mouth daily. 180 tablet 1   No current facility-administered medications for this visit.     Allergies: Ceftriaxone  Past Medical History:  Diagnosis Date  . Chicken pox   . Diabetes mellitus without complication ([place])   . History of kidney stones   . Hyperlipidemia   . Hypertension   . Sleep apnea    CiPaP at night    Past Surgical History:  Procedure Laterality Date  . ANKLE ARTHROSCOPY Right 06/21/2017   Procedure: RIGHT ANKLE ARTHROSCOPY WITH DEBRIDEMENT;  Surgeon: Newt Minion, MD;  Location: [place];  Service: Orthopedics;   Laterality: Right;  . CYSTOSCOPY W/ URETERAL STENT PLACEMENT  08/07/2011   Procedure: CYSTOSCOPY WITH RETROGRADE PYELOGRAM/URETERAL STENT PLACEMENT;  Surgeon: Marissa Nestle, MD;  Location: [facility];  Service: Urology;  Laterality: Left;  Cystoscopy, Left Retrograde Pyelogram, Left Ureteral Ballon Dilation, Double J Stent Placement  . MENISCUS REPAIR     left knee 2018  . SHOULDER SURGERY     bilateral    Family History  Problem Relation Age of Onset  . Prostate cancer Father   . Diabetes Father   . Anesthesia problems Neg Hx   . Hypotension Neg Hx   . Malignant hyperthermia Neg Hx   . Pseudochol deficiency Neg Hx     Social History   Tobacco Use  . Smoking status: Never Smoker  . Smokeless tobacco: Never Used  Substance Use Topics  . Alcohol use: No    Subjective:  Patient presents for follow-up on chronic care needs including: 1) hypertension; 2) hyperlipidemia; 3) Type 2 Diabetes; at last OV, patient's prescriptions were updated as he had been out of medication 'for a while." Is feeling better now that he is back on his medication; Denies any chest pain, shortness of breath, blurred vision or headache. Would like the contact information for the nutritionist he has seen in the past- wants to start doing follow-up there again; has been cleared by orthopedist to start exercising again; is scheduled for his colonoscopy in the  next month.   Objective:  Vitals:   01/09/18 0914  BP: 140/76  Pulse: 73  Temp: 97.8 F (36.6 C)  TempSrc: Oral  SpO2: 97%  Weight: 285 lb 1.9 oz (129.3 kg)  Height: 5' 7" (1.702 m)    General: Well developed, well nourished, in no acute distress  Skin : Warm and dry.  Head: Normocephalic and atraumatic  Eyes: Sclera and conjunctiva clear; pupils round and reactive to light; extraocular movements intact  Lungs: Respirations unlabored; clear to auscultation bilaterally without wheeze, rales, rhonchi  CVS exam: normal rate and regular rhythm.  Abdomen:  Soft; nontender; nondistended; normoactive bowel sounds; no masses or hepatosplenomegaly  Musculoskeletal: No deformities; no active joint inflammation  Extremities: No edema, cyanosis, clubbing  Vessels: Symmetric bilaterally  Neurologic: Alert and oriented; speech intact; face symmetrical; moves all extremities well; CNII-XII intact without focal deficit  Assessment:  1. Uncontrolled type 2 diabetes mellitus with hyperglycemia ([place])   2. Essential hypertension, benign   3. Hyperlipidemia, unspecified hyperlipidemia type   4. Prostate cancer screening     Plan:  1. Update CMP, Hgba1c today; will not adjust medications at this time as patient is just getting back on track with taking his insulin daily; will do a follow-up in 3 months; could consider trial of Ozempic or Trulicity to help with weight loss goals; 2. Uncontrolled; increase Lisinopril HCT to 40/25 and continue Amlodipine; stressed to take baby Aspirin daily; 3. Check lipid panel today; continue same medication; 4. Update PSA today;    No follow-ups on file.  Orders Placed This Encounter  Procedures  . CBC w/Diff    Standing Status:   Future    Number of Occurrences:   1    Standing Expiration Date:   01/09/2019  . Comp Met (CMET)    Standing Status:   Future    Number of Occurrences:   1    Standing Expiration Date:   01/09/2019  . Lipid panel    Standing Status:   Future    Number of Occurrences:   1    Standing Expiration Date:   01/10/2019  . PSA    Standing Status:   Future    Number of Occurrences:   1    Standing Expiration Date:   01/09/2019  . HgB A1c    Standing Status:   Future    Number of Occurrences:   1    Standing Expiration Date:   01/09/2019    Requested Prescriptions   Signed Prescriptions Disp Refills  . lisinopril-hydrochlorothiazide (ZESTORETIC) 20-12.5 MG tablet 180 tablet 1    Sig: Take 2 tablets by mouth daily.

## 2018-01-09 NOTE — Patient Instructions (Signed)
[facility]; Danise EdgeLaura Watson, RD  [phone]

## 2018-01-10 ENCOUNTER — Other Ambulatory Visit: Payer: Self-pay | Admitting: Family

## 2018-01-10 ENCOUNTER — Ambulatory Visit (AMBULATORY_SURGERY_CENTER): Payer: Self-pay | Admitting: *Deleted

## 2018-01-10 ENCOUNTER — Other Ambulatory Visit: Payer: Self-pay

## 2018-01-10 VITALS — Ht 67.0 in | Wt 286.0 lb

## 2018-01-10 DIAGNOSIS — Z1211 Encounter for screening for malignant neoplasm of colon: Secondary | ICD-10-CM

## 2018-01-10 MED ORDER — SOD PICOSULFATE-MAG OX-CIT ACD 10-3.5-12 MG-GM -GM/160ML PO SOLN: 1.0000 | ORAL | 0 refills | Status: DC

## 2018-01-10 MED ORDER — SEMAGLUTIDE(0.25 OR 0.5MG/DOS) 2 MG/1.5ML ~~LOC~~ SOPN: PEN_INJECTOR | SUBCUTANEOUS | 1 refills | Status: DC

## 2018-01-10 NOTE — Progress Notes (Signed)
No egg or soy allergy known to patient  No issues with past sedation with any surgeries  or procedures, no intubation problems  No diet pills per patient No home 02 use per patient  No blood thinners per patient  Pt denies issues with constipation  No A fib or A flutter  EMMI video sent to pt's e mail - pt declined  Clenpiq pay as little as $40 coupon to pt today in [place]

## 2018-01-29 ENCOUNTER — Encounter (INDEPENDENT_AMBULATORY_CARE_PROVIDER_SITE_OTHER): Payer: Self-pay | Admitting: Orthopedic Surgery

## 2018-01-29 ENCOUNTER — Ambulatory Visit (INDEPENDENT_AMBULATORY_CARE_PROVIDER_SITE_OTHER): Payer: BC Managed Care – PPO | Admitting: Orthopedic Surgery

## 2018-01-29 DIAGNOSIS — M25562 Pain in left knee: Secondary | ICD-10-CM | POA: Diagnosis not present

## 2018-01-29 DIAGNOSIS — G8929 Other chronic pain: Secondary | ICD-10-CM

## 2018-01-29 MED ORDER — LIDOCAINE HCL 1 % IJ SOLN
5.0000 mL | INTRAMUSCULAR | Status: AC | PRN
  Administered 2018-01-29: 5 mL

## 2018-01-29 MED ORDER — BUPIVACAINE HCL 0.25 % IJ SOLN
4.0000 mL | INTRAMUSCULAR | Status: AC | PRN
  Administered 2018-01-29: 4 mL via INTRA_ARTICULAR

## 2018-01-29 MED ORDER — METHYLPREDNISOLONE ACETATE 40 MG/ML IJ SUSP
40.0000 mg | INTRAMUSCULAR | Status: AC | PRN
  Administered 2018-01-29: 40 mg via INTRA_ARTICULAR

## 2018-01-29 NOTE — Progress Notes (Signed)
Office Visit Note   [name]           Date of Birth: 1965-10-11           MRN: [id number] Visit Date: 01/29/2018 Requested by: Olive Bass, FNP [address] [place], [place] [address] PCP: Olive Bass, FNP  Subjective: Chief Complaint  Patient presents with  . Left Knee - Pain    HPI: Patient presents for evaluation of left knee pain.  Had relatively sudden onset of sharp stabbing pain several nights ago.  Does not have a history of gout or pseudogout.  Has had left knee surgery done last year which was partial medial meniscectomy.  Patient has some mechanical symptoms and inability to weight-bear after driving a long distance.  Very acute pain in terms of no prior symptoms after recovery from the arthroscopy.  Hard for him to sleep.  He cannot go up and down stairs.  Does describe hearing a discrete medial pop in the knee at the time of injury.              ROS: All systems reviewed are negative as they relate to the chief complaint within the history of present illness.  Patient denies  fevers or chills.   Assessment & Plan: Visit Diagnoses:  1. Chronic pain of left knee     Plan: Impression is left knee pain possible recurrent meniscal tear or meniscal root avulsion.  No effusion today.  Patient has increased body mass index.  Injection performed into the left knee today.  Need MRI scan to evaluate for meniscal pathology or stress fracture.  Plain radiographs prior to last arthroscopy unremarkable.  Follow-Up Instructions: Return for after MRI.   Orders:  Orders Placed This Encounter  Procedures  . MR Knee Left w/o contrast   No orders of the defined types were placed in this encounter.     Procedures: Large Joint Inj: L knee on 01/29/2018 5:23 PM Indications: diagnostic evaluation, joint swelling and pain Details: 18 G 1.5 in needle, superolateral approach  Arthrogram: No  Medications: 5 mL lidocaine 1 %; 40 mg  methylPREDNISolone acetate 40 MG/ML; 4 mL bupivacaine 0.25 % Outcome: tolerated well, no immediate complications Procedure, treatment alternatives, risks and benefits explained, specific risks discussed. Consent was given by the patient. Immediately prior to procedure a time out was called to verify the correct patient, procedure, equipment, support staff and site/side marked as required. Patient was prepped and draped in the usual sterile fashion.       Clinical Data: No additional findings.  Objective: Vital Signs: There were no vitals taken for this visit.  Physical Exam:   Constitutional: Patient appears well-developed HEENT:  Head: Normocephalic Eyes:EOM are normal Neck: Normal range of motion Cardiovascular: Normal rate Pulmonary/chest: Effort normal Neurologic: Patient is alert Skin: Skin is warm Psychiatric: Patient has normal mood and affect  Ortho exam demonstrates  Ortho Exam: Antalgic gait to the left.  Patient has no effusion in the left knee but definite medial joint line tenderness.  Collateral and cruciate ligaments are stable.  No masses lymph adenopathy or skin changes noted in the left knee region.  Specialty Comments:  No specialty comments available.  Imaging: No results found.   PMFS History: Patient Active Problem List   Diagnosis Date Noted  . Osteochondritis dissecans of right talus   . Essential hypertension, benign 06/08/2016  . Routine general medical examination at a health care facility 04/12/2016  . Morbid  obesity ([facility]) 04/12/2016  . Type 2 diabetes mellitus, uncontrolled ([facility]) 03/23/2015  . Rash and nonspecific skin eruption 03/23/2015  . Obstructive apnea 04/21/2014   Past Medical History:  Diagnosis Date  . Chicken pox   . Diabetes mellitus without complication ([facility])   . History of kidney stones   . Hyperlipidemia   . Hypertension   . Sleep apnea    CiPaP at night    Family History  Problem Relation Age of Onset  .  Prostate cancer Father   . Diabetes Father   . Cancer Sister   . Anesthesia problems Neg Hx   . Hypotension Neg Hx   . Malignant hyperthermia Neg Hx   . Pseudochol deficiency Neg Hx   . Colon cancer Neg Hx   . Colon polyps Neg Hx   . Esophageal cancer Neg Hx   . Rectal cancer Neg Hx   . Stomach cancer Neg Hx     Past Surgical History:  Procedure Laterality Date  . ANKLE ARTHROSCOPY Right 06/21/2017   Procedure: RIGHT ANKLE ARTHROSCOPY WITH DEBRIDEMENT;  Surgeon: Nadara Mustarduda, Marcus V, MD;  [facility];  Service: Orthopedics;  Laterality: Right;  . CYSTOSCOPY W/ URETERAL STENT PLACEMENT  08/07/2011   Procedure: CYSTOSCOPY WITH RETROGRADE PYELOGRAM/URETERAL STENT PLACEMENT;  Surgeon: Ky BarbanMohammad I Javaid, MD;  Location: [facility];  Service: Urology;  Laterality: Left;  Cystoscopy, Left Retrograde Pyelogram, Left Ureteral Ballon Dilation, Double J Stent Placement  . MENISCUS REPAIR     left knee 2018  . SHOULDER SURGERY     bilateral   Social History   Occupational History  . Occupation: Data processing managerMaintenance Mechanic  Tobacco Use  . Smoking status: Never Smoker  . Smokeless tobacco: Never Used  Substance and Sexual Activity  . Alcohol use: No  . Drug use: No  . Sexual activity: Not on file

## 2018-01-31 ENCOUNTER — Encounter: Payer: BC Managed Care – PPO | Admitting: Gastroenterology

## 2018-01-31 ENCOUNTER — Ambulatory Visit (INDEPENDENT_AMBULATORY_CARE_PROVIDER_SITE_OTHER): Payer: BC Managed Care – PPO | Admitting: Physician Assistant

## 2018-02-06 ENCOUNTER — Ambulatory Visit (HOSPITAL_COMMUNITY): Payer: BC Managed Care – PPO | Admitting: Psychiatry

## 2018-02-17 ENCOUNTER — Ambulatory Visit
Admission: RE | Admit: 2018-02-17 | Discharge: 2018-02-17 | Disposition: A | Discharge status: Home / Self Care | Payer: BC Managed Care – PPO | Source: Ambulatory Visit | Attending: Orthopedic Surgery | Admitting: Orthopedic Surgery

## 2018-02-17 DIAGNOSIS — G8929 Other chronic pain: Secondary | ICD-10-CM

## 2018-02-17 DIAGNOSIS — M25562 Pain in left knee: Principal | ICD-10-CM

## 2018-02-18 ENCOUNTER — Ambulatory Visit
Admission: RE | Admit: 2018-02-18 | Discharge: 2018-02-18 | Disposition: A | Discharge status: Home / Self Care | Payer: BC Managed Care – PPO | Source: Ambulatory Visit | Attending: Orthopedic Surgery | Admitting: Orthopedic Surgery

## 2018-02-21 ENCOUNTER — Encounter (INDEPENDENT_AMBULATORY_CARE_PROVIDER_SITE_OTHER): Payer: Self-pay | Admitting: Orthopedic Surgery

## 2018-02-21 ENCOUNTER — Ambulatory Visit (INDEPENDENT_AMBULATORY_CARE_PROVIDER_SITE_OTHER): Payer: BC Managed Care – PPO | Admitting: Orthopedic Surgery

## 2018-02-21 DIAGNOSIS — M25562 Pain in left knee: Secondary | ICD-10-CM | POA: Diagnosis not present

## 2018-02-22 ENCOUNTER — Encounter (INDEPENDENT_AMBULATORY_CARE_PROVIDER_SITE_OTHER): Payer: Self-pay | Admitting: Orthopedic Surgery

## 2018-02-22 NOTE — Progress Notes (Signed)
Office Visit Note   [name]           Date of Birth: 06-16-1966           MRN: [id number] Visit Date: 02/21/2018 Requested by: Olive Bass, FNP [address] [place], [place] [address] PCP: Olive Bass, FNP  Subjective: Chief Complaint  Patient presents with  . Left Knee - Follow-up    HPI: Patient presents for evaluation of left knee pain.  Since I have seen him he had an MRI scan.  That scan is reviewed with the patient today.  He does show to intrameniscal cyst presence but no recurrent meniscal tearing is noted.  He is currently not taking any medication.  He has had 2 injections with aspiration.  He has not played golf yet.              ROS: All systems reviewed are negative as they relate to the chief complaint within the history of present illness.  Patient denies  fevers or chills.   Assessment & Plan: Visit Diagnoses:  1. Left knee pain, unspecified chronicity     Plan: Impression is left knee pain with minimal effusion today and no clear-cut definitive operative pathology present.  Regular observe this for now.  Arthroscopic evaluation could be indicated if his symptoms worsen.  We will see him back as needed but no indication for surgery is present today  Follow-Up Instructions: Return if symptoms worsen or fail to improve.   Orders:  No orders of the defined types were placed in this encounter.  No orders of the defined types were placed in this encounter.     Procedures: No procedures performed   Clinical Data: No additional findings.  Objective: Vital Signs: There were no vitals taken for this visit.  Physical Exam:   Constitutional: Patient appears well-developed HEENT:  Head: Normocephalic Eyes:EOM are normal Neck: Normal range of motion Cardiovascular: Normal rate Pulmonary/chest: Effort normal Neurologic: Patient is alert Skin: Skin is warm Psychiatric: Patient has normal mood and affect    Ortho  Exam: Ortho exam demonstrates minimal effusion and good range of motion of the left knee with mild medial sided tenderness.  Collateral cruciate ligaments are stable.  Pedal pulses intact.  No other masses lymph adenopathy or skin changes noted in that left knee region  Specialty Comments:  No specialty comments available.  Imaging: No results found.   PMFS History: Patient Active Problem List   Diagnosis Date Noted  . Osteochondritis dissecans of right talus   . Essential hypertension, benign 06/08/2016  . Routine general medical examination at a health care facility 04/12/2016  . Morbid obesity ([facility]) 04/12/2016  . Type 2 diabetes mellitus, uncontrolled ([facility]) 03/23/2015  . Rash and nonspecific skin eruption 03/23/2015  . Obstructive apnea 04/21/2014   Past Medical History:  Diagnosis Date  . Chicken pox   . Diabetes mellitus without complication ([facility])   . History of kidney stones   . Hyperlipidemia   . Hypertension   . Sleep apnea    CiPaP at night    Family History  Problem Relation Age of Onset  . Prostate cancer Father   . Diabetes Father   . Cancer Sister   . Anesthesia problems Neg Hx   . Hypotension Neg Hx   . Malignant hyperthermia Neg Hx   . Pseudochol deficiency Neg Hx   . Colon cancer Neg Hx   . Colon polyps Neg Hx   .  Esophageal cancer Neg Hx   . Rectal cancer Neg Hx   . Stomach cancer Neg Hx     Past Surgical History:  Procedure Laterality Date  . ANKLE ARTHROSCOPY Right 06/21/2017   Procedure: RIGHT ANKLE ARTHROSCOPY WITH DEBRIDEMENT;  Surgeon: Nadara Mustarduda, Marcus V, MD;  [facility];  Service: Orthopedics;  Laterality: Right;  . CYSTOSCOPY W/ URETERAL STENT PLACEMENT  08/07/2011   Procedure: CYSTOSCOPY WITH RETROGRADE PYELOGRAM/URETERAL STENT PLACEMENT;  Surgeon: Ky BarbanMohammad I Javaid, MD;  Location: [facility];  Service: Urology;  Laterality: Left;  Cystoscopy, Left Retrograde Pyelogram, Left Ureteral Ballon Dilation, Double J Stent Placement  . MENISCUS REPAIR      left knee 2018  . SHOULDER SURGERY     bilateral   Social History   Occupational History  . Occupation: Data processing managerMaintenance Mechanic  Tobacco Use  . Smoking status: Never Smoker  . Smokeless tobacco: Never Used  Substance and Sexual Activity  . Alcohol use: No  . Drug use: No  . Sexual activity: Not on file

## 2018-04-08 ENCOUNTER — Other Ambulatory Visit: Payer: Self-pay | Admitting: Family

## 2018-04-10 ENCOUNTER — Ambulatory Visit (HOSPITAL_COMMUNITY): Payer: BC Managed Care – PPO | Admitting: Psychiatry

## 2018-04-10 ENCOUNTER — Encounter

## 2018-04-11 ENCOUNTER — Ambulatory Visit: Payer: BC Managed Care – PPO | Admitting: Family

## 2018-04-14 ENCOUNTER — Other Ambulatory Visit (INDEPENDENT_AMBULATORY_CARE_PROVIDER_SITE_OTHER): Payer: BC Managed Care – PPO

## 2018-04-14 ENCOUNTER — Encounter: Payer: Self-pay | Admitting: Family

## 2018-04-14 ENCOUNTER — Ambulatory Visit: Payer: BC Managed Care – PPO | Admitting: Family

## 2018-04-14 VITALS — BP 148/88 | HR 89 | Temp 98.1°F | Ht 67.0 in | Wt 289.0 lb

## 2018-04-14 DIAGNOSIS — E1165 Type 2 diabetes mellitus with hyperglycemia: Secondary | ICD-10-CM

## 2018-04-14 DIAGNOSIS — I10 Essential (primary) hypertension: Secondary | ICD-10-CM

## 2018-04-14 DIAGNOSIS — IMO0001 Reserved for inherently not codable concepts without codable children: Secondary | ICD-10-CM

## 2018-04-14 DIAGNOSIS — Z23 Encounter for immunization: Secondary | ICD-10-CM

## 2018-04-14 DIAGNOSIS — Z794 Long term (current) use of insulin: Secondary | ICD-10-CM

## 2018-04-14 LAB — HEMOGLOBIN A1C: HEMOGLOBIN A1C: 7.7 % — AB (ref 4.6–6.5)

## 2018-04-14 MED ORDER — GLUCOSE BLOOD VI STRP: 1.0000 | ORAL_STRIP | Freq: Three times a day (TID) | 3 refills | Status: DC

## 2018-04-14 MED ORDER — INSULIN DEGLUDEC 100 UNIT/ML ~~LOC~~ SOPN: 25.0000 [IU] | PEN_INJECTOR | Freq: Every day | SUBCUTANEOUS | 1 refills | Status: DC

## 2018-04-14 MED ORDER — SEMAGLUTIDE (1 MG/DOSE) 2 MG/1.5ML ~~LOC~~ SOPN: 1.0000 mg | PEN_INJECTOR | SUBCUTANEOUS | 3 refills | Status: DC

## 2018-04-14 NOTE — Progress Notes (Signed)
James [name] is a 52 y.o. male with the following history as recorded in EpicCare:  Patient Active Problem List   Diagnosis Date Noted  . Osteochondritis dissecans of right talus   . Essential hypertension, benign 06/08/2016  . Routine general medical examination at a health care facility 04/12/2016  . Morbid obesity ([place]) 04/12/2016  . Type 2 diabetes mellitus, uncontrolled ([place]) 03/23/2015  . Rash and nonspecific skin eruption 03/23/2015  . Obstructive apnea 04/21/2014    Current Outpatient Medications  Medication Sig Dispense Refill  . amLODipine (NORVASC) 10 MG tablet Take 1 tablet (10 mg total) by mouth at bedtime. 90 tablet 1  . aspirin EC 81 MG tablet Take 81 mg by mouth daily.    Marland Kitchen atorvastatin (LIPITOR) 20 MG tablet Take 1 tablet (20 mg total) by mouth daily. 90 tablet 1  . glucose blood (ONE TOUCH ULTRA TEST) test strip 1 each by Other route 3 (three) times daily. Use as instructed 100 each 3  . insulin aspart (NOVOLOG FLEXPEN) 100 UNIT/ML FlexPen Inject up to 30 units SQ twice daily as needed for high blood sugar 30 mL 6  . insulin degludec (TRESIBA FLEXTOUCH) 100 UNIT/ML SOPN FlexTouch Pen Inject 0.25 mLs (25 Units total) into the skin at bedtime. 15 mL 1  . lisinopril-hydrochlorothiazide (ZESTORETIC) 20-12.5 MG tablet Take 2 tablets by mouth daily. 180 tablet 1  . metFORMIN (GLUCOPHAGE) 500 MG tablet TAKE 1 TABLET BY MOUTH TWICE DAILY FOR DIABETES 180 tablet 0  . Sod Picosulfate-Mag Ox-Cit Acd (CLENPIQ) 10-3.5-12 MG-GM -GM/160ML SOLN Take 1 kit by mouth as directed. 2 Bottle 0  . Semaglutide, 1 MG/DOSE, (OZEMPIC, 1 MG/DOSE,) 2 MG/1.5ML SOPN Inject 1 mg into the skin once a week. 4 pen 3   No current facility-administered medications for this visit.     Allergies: Ceftriaxone  Past Medical History:  Diagnosis Date  . Chicken pox   . Diabetes mellitus without complication ([place])   . History of kidney stones   . Hyperlipidemia   . Hypertension   . Sleep apnea    CiPaP at night    Past Surgical History:  Procedure Laterality Date  . ANKLE ARTHROSCOPY Right 06/21/2017   Procedure: RIGHT ANKLE ARTHROSCOPY WITH DEBRIDEMENT;  Surgeon: Newt Minion, MD;  Location: [facility];  Service: Orthopedics;  Laterality: Right;  . CYSTOSCOPY W/ URETERAL STENT PLACEMENT  08/07/2011   Procedure: CYSTOSCOPY WITH RETROGRADE PYELOGRAM/URETERAL STENT PLACEMENT;  Surgeon: Marissa Nestle, MD;  Location: [facility];  Service: Urology;  Laterality: Left;  Cystoscopy, Left Retrograde Pyelogram, Left Ureteral Ballon Dilation, Double J Stent Placement  . MENISCUS REPAIR     left knee 2018  . SHOULDER SURGERY     bilateral    Family History  Problem Relation Age of Onset  . Prostate cancer Father   . Diabetes Father   . Cancer Sister   . Anesthesia problems Neg Hx   . Hypotension Neg Hx   . Malignant hyperthermia Neg Hx   . Pseudochol deficiency Neg Hx   . Colon cancer Neg Hx   . Colon polyps Neg Hx   . Esophageal cancer Neg Hx   . Rectal cancer Neg Hx   . Stomach cancer Neg Hx     Social History   Tobacco Use  . Smoking status: Never Smoker  . Smokeless tobacco: Never Used  Substance Use Topics  . Alcohol use: No    Subjective:  3 month follow-up on Type 2 diabetes; admits is  working 70+ hours per week; not eating "like I should." Personal stress has been very high; up 15 pounds compared to this time last year;  Taking his Tresiba at night; not having the extreme fluctuations- not having to use as much Novolog since starting the Ozempic;  Will be going to see nutritionist in 2 weeks;     Objective:  Vitals:   04/14/18 1619  BP: (!) 148/88  Pulse: 89  Temp: 98.1 F (36.7 C)  TempSrc: Oral  SpO2: 97%  Weight: 289 lb (131.1 kg)  Height: '5\' 7"'  (1.702 m)    General: Well developed, well nourished, in no acute distress  Skin : Warm and dry.  Head: Normocephalic and atraumatic  Eyes: Sclera and conjunctiva clear; pupils round and reactive to light;  extraocular movements intact  Ears: External normal; canals clear; tympanic membranes normal  Oropharynx: Pink, supple. No suspicious lesions  Neck: Supple without thyromegaly, adenopathy  Lungs: Respirations unlabored; clear to auscultation bilaterally without wheeze, rales, rhonchi  CVS exam: normal rate and regular rhythm.  Neurologic: Alert and oriented; speech intact; face symmetrical; moves all extremities well; CNII-XII intact without focal deficit   Assessment:  1. Uncontrolled type 2 diabetes mellitus without complication, with long-term current use of insulin ([place])   2. Essential hypertension, benign   3. Need for influenza vaccination     Plan:  1. Update CMP, Hgba1c; increase Ozempic to 1 mg/ week; patient is committed to making lifestyle changes/ will be seeing nutritionist soon; will not make any major medication changes at this time. 2. Uncontrolled; patient wants to lose weight before adding medication. 3. Flu shot given.   Return in about 3 months (around 07/15/2018).  Orders Placed This Encounter  Procedures  . Flu Vaccine QUAD 36+ mos IM  . Comp Met (CMET)    Standing Status:   Future    Number of Occurrences:   1    Standing Expiration Date:   04/14/2019  . HgB A1c    Standing Status:   Future    Number of Occurrences:   1    Standing Expiration Date:   04/14/2019    Requested Prescriptions   Signed Prescriptions Disp Refills  . insulin degludec (TRESIBA FLEXTOUCH) 100 UNIT/ML SOPN FlexTouch Pen 15 mL 1    Sig: Inject 0.25 mLs (25 Units total) into the skin at bedtime.  Marland Kitchen glucose blood (ONE TOUCH ULTRA TEST) test strip 100 each 3    Sig: 1 each by Other route 3 (three) times daily. Use as instructed  . Semaglutide, 1 MG/DOSE, (OZEMPIC, 1 MG/DOSE,) 2 MG/1.5ML SOPN 4 pen 3    Sig: Inject 1 mg into the skin once a week.

## 2018-04-15 ENCOUNTER — Encounter: Payer: Self-pay | Admitting: Family

## 2018-04-15 LAB — COMPREHENSIVE METABOLIC PANEL
ALBUMIN: 4.5 g/dL (ref 3.5–5.2)
ALT: 27 U/L (ref 0–53)
AST: 18 U/L (ref 0–37)
Alkaline Phosphatase: 77 U/L (ref 39–117)
BILIRUBIN TOTAL: 0.7 mg/dL (ref 0.2–1.2)
BUN: 15 mg/dL (ref 6–23)
CO2: 29 mEq/L (ref 19–32)
Calcium: 9.6 mg/dL (ref 8.4–10.5)
Chloride: 103 mEq/L (ref 96–112)
Creatinine, Ser: 1.02 mg/dL (ref 0.40–1.50)
GFR: 82.01 mL/min (ref >60.00)
GLUCOSE: 106 mg/dL — AB (ref 70–99)
Potassium: 3.5 mEq/L (ref 3.5–5.1)
Sodium: 139 mEq/L (ref 135–145)
TOTAL PROTEIN: 7 g/dL (ref 6.0–8.3)

## 2018-06-05 ENCOUNTER — Ambulatory Visit: Payer: BC Managed Care – PPO | Admitting: Internal Medicine

## 2018-06-05 DIAGNOSIS — Z0289 Encounter for other administrative examinations: Secondary | ICD-10-CM

## 2018-07-18 ENCOUNTER — Ambulatory Visit: Payer: BC Managed Care – PPO | Admitting: Family

## 2018-08-04 ENCOUNTER — Ambulatory Visit: Payer: BC Managed Care – PPO | Admitting: Family

## 2018-08-08 ENCOUNTER — Ambulatory Visit: Payer: BC Managed Care – PPO | Admitting: Family

## 2018-08-09 ENCOUNTER — Other Ambulatory Visit: Payer: Self-pay | Admitting: Family

## 2018-09-24 ENCOUNTER — Telehealth: Payer: Self-pay

## 2018-09-24 NOTE — Telephone Encounter (Signed)
Spoke with patient today regarding cancelling in office appointment and doing virtual instead due to his medical hx of being diabetic. Patient was offered virtual visit but insisted he preferred to still come into the office for a face to face. Explained to patient risk of bringing him in due to his hx of diabetes but stated he wasn't worried about being exposed to anything and that [place] and was [place]. He wanted to keep appointment for Monday in office since he was going to be in [place] already. Informed Vernona Rieger he still wanted to keep appointment. Documenting conversation with patient to reflect that he was offered virtual visit but declined and still wanted to come in and be seen.

## 2018-09-29 ENCOUNTER — Other Ambulatory Visit: Payer: Self-pay | Admitting: Family

## 2018-09-29 ENCOUNTER — Ambulatory Visit: Payer: BC Managed Care – PPO | Admitting: Family

## 2018-09-29 ENCOUNTER — Other Ambulatory Visit (INDEPENDENT_AMBULATORY_CARE_PROVIDER_SITE_OTHER): Payer: BC Managed Care – PPO

## 2018-09-29 DIAGNOSIS — E1165 Type 2 diabetes mellitus with hyperglycemia: Secondary | ICD-10-CM

## 2018-09-29 LAB — LIPID PANEL
Cholesterol: 110 mg/dL (ref 0–200)
HDL: 32.2 mg/dL — ABNORMAL LOW (ref >39.00)
LDL Cholesterol: 58 mg/dL (ref 0–99)
NONHDL: 77.76
Total CHOL/HDL Ratio: 3
Triglycerides: 97 mg/dL (ref 0.0–149.0)
VLDL: 19.4 mg/dL (ref 0.0–40.0)

## 2018-09-29 LAB — COMPREHENSIVE METABOLIC PANEL
ALT: 21 U/L (ref 0–53)
AST: 16 U/L (ref 0–37)
Albumin: 4.5 g/dL (ref 3.5–5.2)
Alkaline Phosphatase: 55 U/L (ref 39–117)
BUN: 16 mg/dL (ref 6–23)
CO2: 31 mEq/L (ref 19–32)
Calcium: 9.7 mg/dL (ref 8.4–10.5)
Chloride: 103 mEq/L (ref 96–112)
Creatinine, Ser: 0.93 mg/dL (ref 0.40–1.50)
GFR: 85.68 mL/min (ref >60.00)
GLUCOSE: 109 mg/dL — AB (ref 70–99)
Potassium: 3.6 mEq/L (ref 3.5–5.1)
SODIUM: 140 meq/L (ref 135–145)
Total Bilirubin: 1 mg/dL (ref 0.2–1.2)
Total Protein: 6.8 g/dL (ref 6.0–8.3)

## 2018-09-29 LAB — CBC WITH DIFFERENTIAL/PLATELET
Basophils Absolute: 0.1 10*3/uL (ref 0.0–0.1)
Basophils Relative: 0.6 % (ref 0.0–3.0)
EOS PCT: 2.1 % (ref 0.0–5.0)
Eosinophils Absolute: 0.2 10*3/uL (ref 0.0–0.7)
HCT: 41.9 % (ref 39.0–52.0)
Hemoglobin: 14.7 g/dL (ref 13.0–17.0)
Lymphocytes Relative: 27.1 % (ref 12.0–46.0)
Lymphs Abs: 2.6 10*3/uL (ref 0.7–4.0)
MCHC: 35.1 g/dL (ref 30.0–36.0)
MCV: 86.2 fl (ref 78.0–100.0)
Monocytes Absolute: 0.5 10*3/uL (ref 0.1–1.0)
Monocytes Relative: 5.6 % (ref 3.0–12.0)
Neutro Abs: 6.1 10*3/uL (ref 1.4–7.7)
Neutrophils Relative %: 64.6 % (ref 43.0–77.0)
Platelets: 277 10*3/uL (ref 150.0–400.0)
RBC: 4.86 Mil/uL (ref 4.22–5.81)
RDW: 14.3 % (ref 11.5–15.5)
WBC: 9.4 10*3/uL (ref 4.0–10.5)

## 2018-09-29 LAB — HEMOGLOBIN A1C: HEMOGLOBIN A1C: 7.1 % — AB (ref 4.6–6.5)

## 2018-09-30 ENCOUNTER — Encounter: Payer: Self-pay | Admitting: Family

## 2018-10-03 ENCOUNTER — Encounter: Payer: Self-pay | Admitting: Family

## 2018-10-03 ENCOUNTER — Ambulatory Visit (INDEPENDENT_AMBULATORY_CARE_PROVIDER_SITE_OTHER): Payer: BC Managed Care – PPO | Admitting: Family

## 2018-10-03 VITALS — BP 124/81 | Wt 260.0 lb

## 2018-10-03 DIAGNOSIS — E785 Hyperlipidemia, unspecified: Secondary | ICD-10-CM

## 2018-10-03 DIAGNOSIS — I10 Essential (primary) hypertension: Secondary | ICD-10-CM

## 2018-10-03 DIAGNOSIS — E119 Type 2 diabetes mellitus without complications: Secondary | ICD-10-CM

## 2018-10-03 DIAGNOSIS — E1169 Type 2 diabetes mellitus with other specified complication: Secondary | ICD-10-CM | POA: Insufficient documentation

## 2018-10-03 DIAGNOSIS — R809 Proteinuria, unspecified: Secondary | ICD-10-CM | POA: Insufficient documentation

## 2018-10-03 MED ORDER — LISINOPRIL-HYDROCHLOROTHIAZIDE 20-12.5 MG PO TABS: 2.0000 | ORAL_TABLET | Freq: Every day | ORAL | 1 refills | Status: DC

## 2018-10-03 MED ORDER — METFORMIN HCL 500 MG PO TABS: 500.0000 mg | ORAL_TABLET | Freq: Every day | ORAL | 1 refills | Status: DC

## 2018-10-03 MED ORDER — ATORVASTATIN CALCIUM 20 MG PO TABS: 20.0000 mg | ORAL_TABLET | Freq: Every day | ORAL | 0 refills | Status: DC

## 2018-10-03 NOTE — Progress Notes (Signed)
James [name] is a 53 y.o. male with the following history as recorded in EpicCare:  Patient Active Problem List   Diagnosis Date Noted  . Type 2 diabetes mellitus without complication ([place]) 74/06/8785  . Osteochondritis dissecans of right talus   . Essential hypertension, benign 06/08/2016  . Routine general medical examination at a health care facility 04/12/2016  . Morbid obesity ([place]) 04/12/2016  . Rash and nonspecific skin eruption 03/23/2015  . Obstructive apnea 04/21/2014    Current Outpatient Medications  Medication Sig Dispense Refill  . amLODipine (NORVASC) 10 MG tablet Take 1 tablet (10 mg total) by mouth at bedtime. 90 tablet 1  . aspirin EC 81 MG tablet Take 81 mg by mouth daily.    Marland Kitchen atorvastatin (LIPITOR) 20 MG tablet TAKE 1 TABLET BY MOUTH ONCE DAILY 90 tablet 0  . glucose blood (ONE TOUCH ULTRA TEST) test strip 1 each by Other route 3 (three) times daily. Use as instructed 100 each 3  . insulin aspart (NOVOLOG FLEXPEN) 100 UNIT/ML FlexPen Inject up to 30 units SQ twice daily as needed for high blood sugar 30 mL 6  . insulin degludec (TRESIBA FLEXTOUCH) 100 UNIT/ML SOPN FlexTouch Pen Inject 0.25 mLs (25 Units total) into the skin at bedtime. 15 mL 1  . lisinopril-hydrochlorothiazide (ZESTORETIC) 20-12.5 MG tablet Take 2 tablets by mouth daily. 180 tablet 1  . metFORMIN (GLUCOPHAGE) 500 MG tablet TAKE 1 TABLET BY MOUTH TWICE DAILY FOR DIABETES 180 tablet 0  . Semaglutide, 1 MG/DOSE, (OZEMPIC, 1 MG/DOSE,) 2 MG/1.5ML SOPN Inject 1 mg into the skin once a week. 4 pen 3  . Sod Picosulfate-Mag Ox-Cit Acd (CLENPIQ) 10-3.5-12 MG-GM -GM/160ML SOLN Take 1 kit by mouth as directed. 2 Bottle 0   No current facility-administered medications for this visit.     Allergies: Ceftriaxone  Past Medical History:  Diagnosis Date  . Chicken pox   . Diabetes mellitus without complication ([place])   . History of kidney stones   . Hyperlipidemia   . Hypertension   . Sleep apnea    CiPaP  at night    Past Surgical History:  Procedure Laterality Date  . ANKLE ARTHROSCOPY Right 06/21/2017   Procedure: RIGHT ANKLE ARTHROSCOPY WITH DEBRIDEMENT;  Surgeon: Newt Minion, MD;  Location: [facility];  Service: Orthopedics;  Laterality: Right;  . CYSTOSCOPY W/ URETERAL STENT PLACEMENT  08/07/2011   Procedure: CYSTOSCOPY WITH RETROGRADE PYELOGRAM/URETERAL STENT PLACEMENT;  Surgeon: Marissa Nestle, MD;  Location: [facility];  Service: Urology;  Laterality: Left;  Cystoscopy, Left Retrograde Pyelogram, Left Ureteral Ballon Dilation, Double J Stent Placement  . MENISCUS REPAIR     left knee 2018  . SHOULDER SURGERY     bilateral    Family History  Problem Relation Age of Onset  . Prostate cancer Father   . Diabetes Father   . Cancer Sister   . Anesthesia problems Neg Hx   . Hypotension Neg Hx   . Malignant hyperthermia Neg Hx   . Pseudochol deficiency Neg Hx   . Colon cancer Neg Hx   . Colon polyps Neg Hx   . Esophageal cancer Neg Hx   . Rectal cancer Neg Hx   . Stomach cancer Neg Hx     Social History   Tobacco Use  . Smoking status: Never Smoker  . Smokeless tobacco: Never Used  Substance Use Topics  . Alcohol use: No    Subjective:    I connected with James [name] on  10/03/18 at  3:00 PM EDT by a video enabled telemedicine application and verified that I am speaking with the correct person using two identifiers.   I discussed the limitations of evaluation and management by telemedicine and the availability of in person appointments. The patient expressed understanding and agreed to proceed.  Presents for a virtual visit to follow-up on his diabetes; had labs done earlier this week which showed much improved control- Hgba1c at 7.1; lipid panel improved as well; Per patient, he has been working very hard on exercise/ healthy eating and weight is down 260 pounds; has actually only been taking one Metformin per day and blood sugars are not higher than 200; has not used  insulin or Ozempic for at least 2-3 months.   As far as blood pressure, averaging 124-130/81; not taking Amlodipine at night; has only been taking Lisinopril HCT; Denies any chest pain, shortness of breath, blurred vision or headache.  Feeling much better and excited about his progress/ how good his numbers looked earlier this week; goal is to get off medication completely.      Objective:  Vitals:   10/03/18 1511  BP: 124/81  Weight: 260 lb (117.9 kg)    General:  in no acute distress  Lungs: Respirations unlabored;  Neurologic: Alert and oriented; speech intact;   Assessment:  1. Essential hypertension, benign   2. Hyperlipidemia, unspecified hyperlipidemia type   3. Type 2 diabetes mellitus without complication, unspecified whether long term insulin use ([place])     Plan:  1. Control improved with weight loss; requiring less medication; hold Amlodipine for now; continue Lisinopril HCT; refill updated; 2. Lipids checked earlier this week; continue Lipitor 20 mg- okay to take every other day; 3. Control markedly improved; Hgba1c at 7.1; patient has only been on Metformin daily x 2-3 months; refill updated;  Follow-up in 3 months, sooner prn.   No follow-ups on file.  No orders of the defined types were placed in this encounter.   Requested Prescriptions    No prescriptions requested or ordered in this encounter

## 2018-12-22 IMAGING — MR MR KNEE*L* W/O CM
4 of 5 series · 21 of 40 positions shown · non-contrast
Comparison: None.

CLINICAL DATA: Left knee pain and weakness x2 months without known
injury.

EXAM:
MRI OF THE LEFT KNEE WITHOUT CONTRAST
TECHNIQUE: Multiplanar, multisequence MR imaging of the knee was performed. No
intravenous contrast was administered.

[Series 3: pd_tse_fs_tra · axial · 4.0mm · 0.47mm/px · z∈[-44,+39]mm · 3 of 27 slices shown]
[im 4/27]
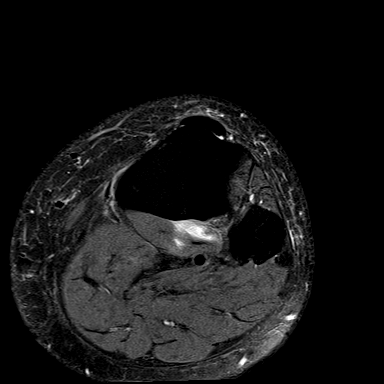
[im 14/27]
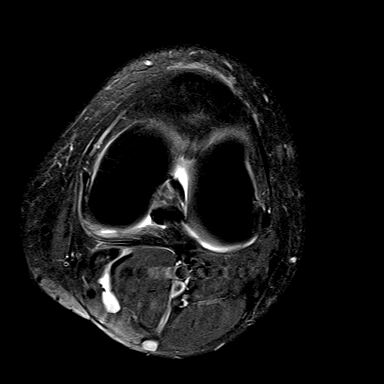
[im 23/27]
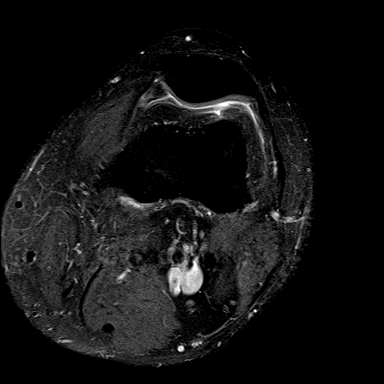

[Series 5: T2 fat-sat · coronal · 3.2mm · 0.62mm/px · 8 of 26 slices shown]
[im 1/26]
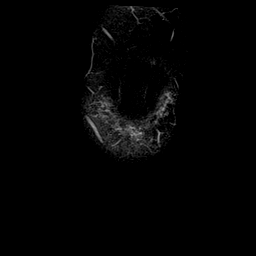
[im 4/26]
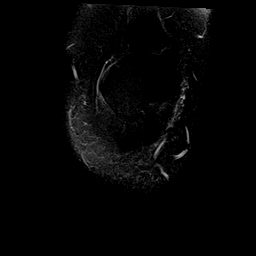
[im 8/26]
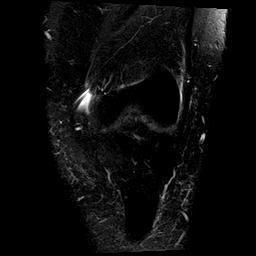
[im 11/26]
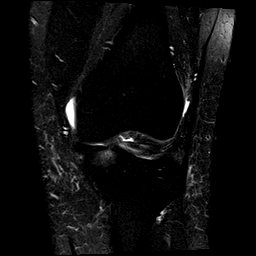
[im 15/26]
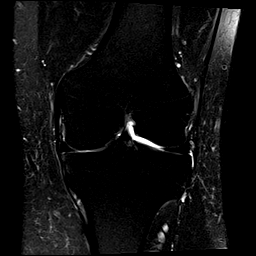
[im 18/26]
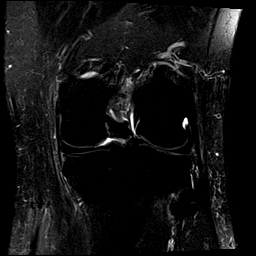
[im 22/26]
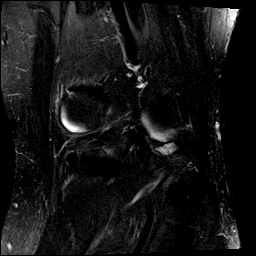
[im 26/26]
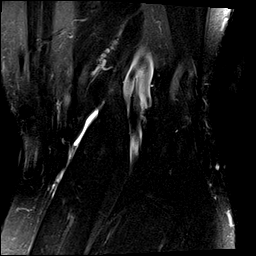

[Series 6: T1 · coronal · 3.2mm · 0.25mm/px · 3 of 26 slices shown]
[im 4/26]
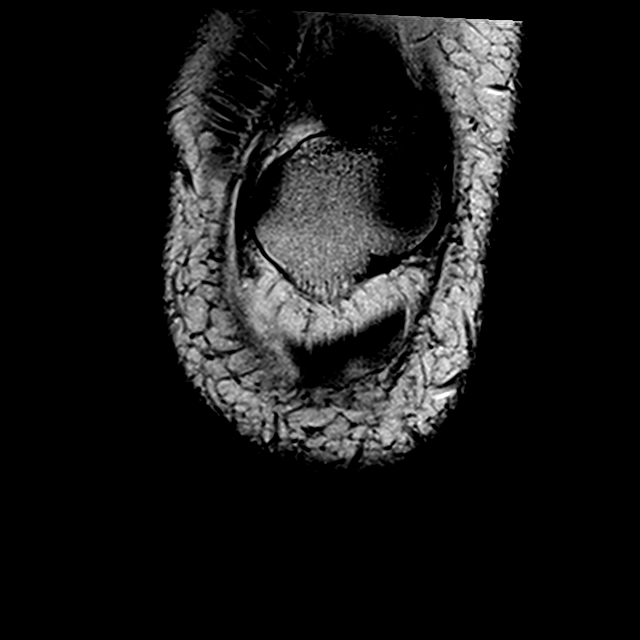
[im 15/26]
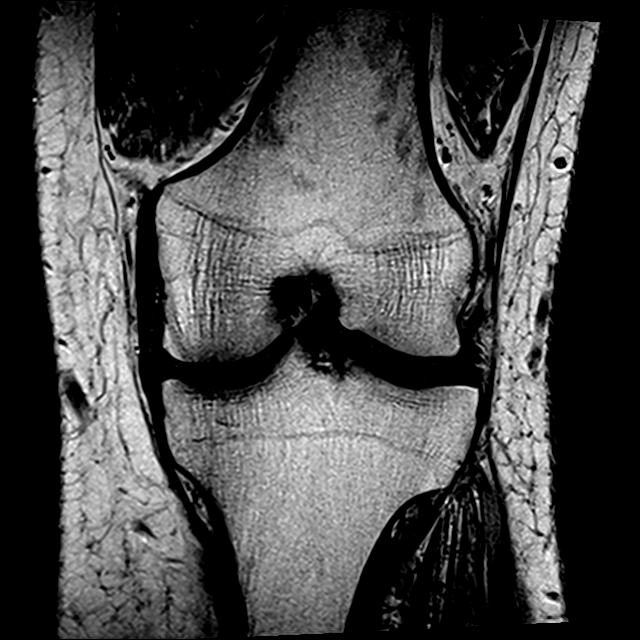
[im 22/26]
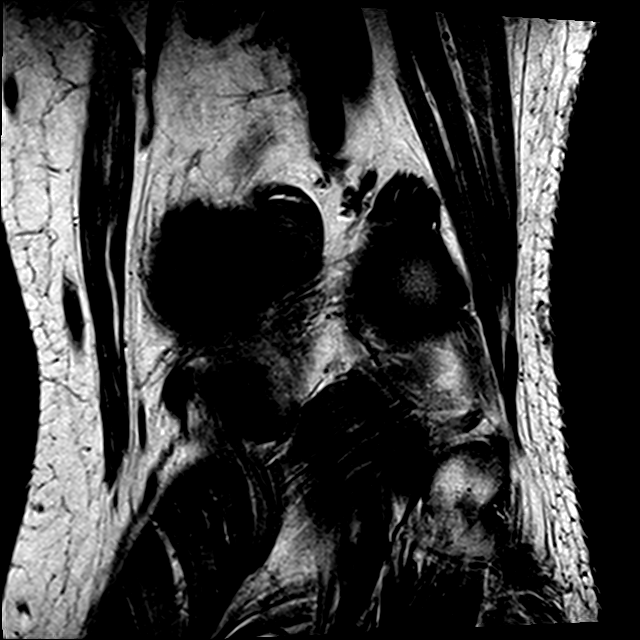

[Series 7: PD fat-sat · sagittal · 3.5mm · 0.25mm/px · 7 of 23 slices shown]
[im 1/23]
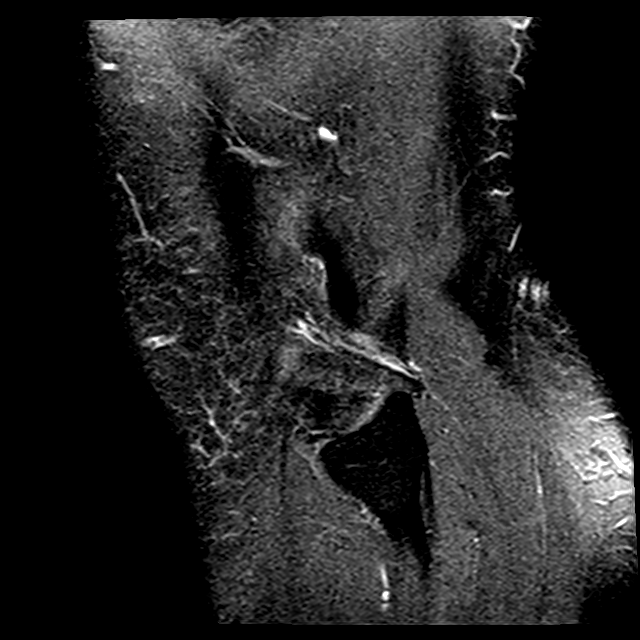
[im 4/23]
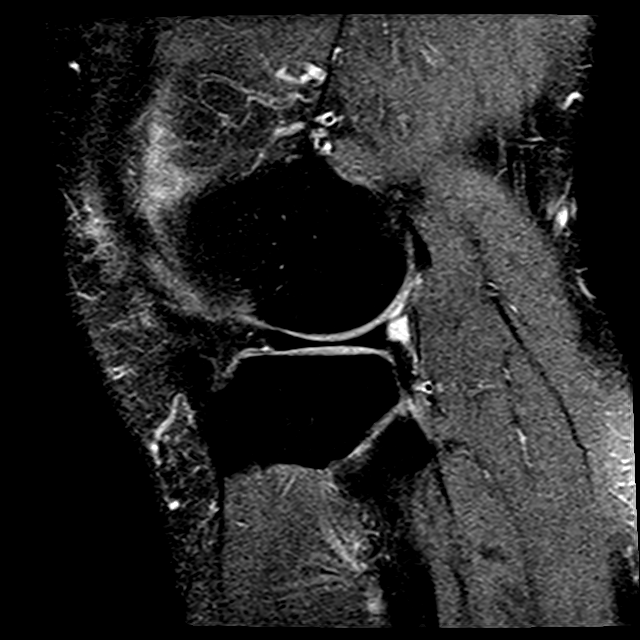
[im 8/23]
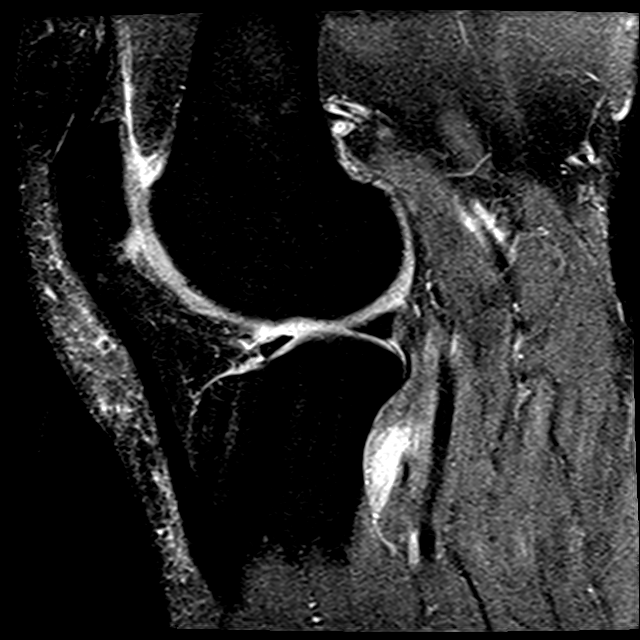
[im 12/23]
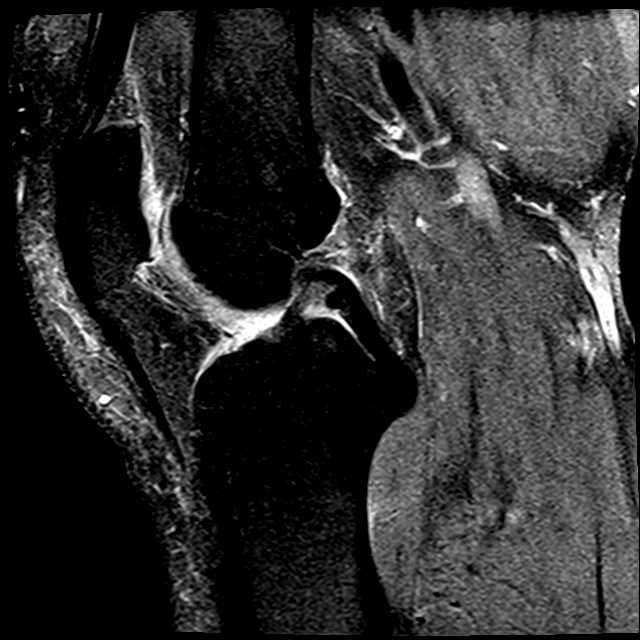
[im 15/23]
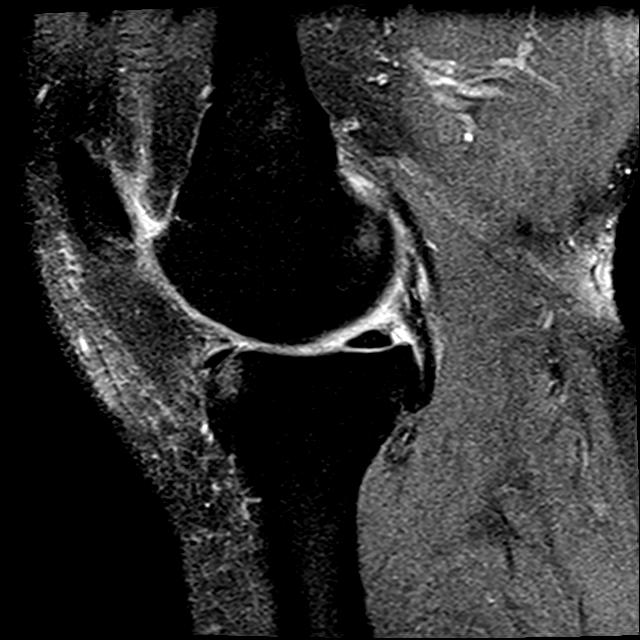
[im 19/23]
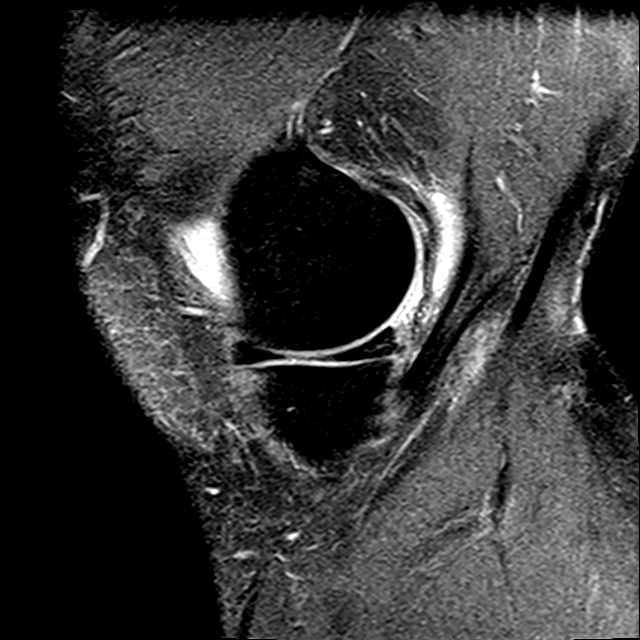
[im 23/23]
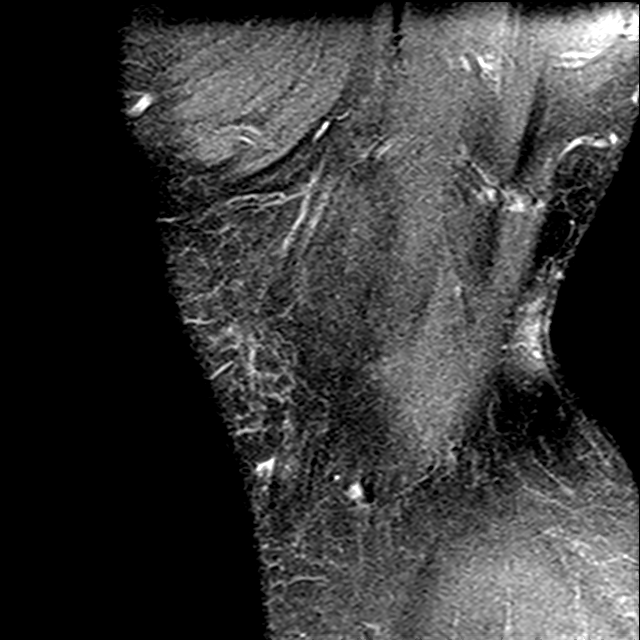

[21 of 40 positions shown; findings below may reference images not displayed]

FINDINGS: MENISCI

Medial meniscus: Tear of the posterior horn of the medial meniscus,
(series 7, image 7) surfacing superiorly. Further anteriorly, there
is intermediate signal near the free edge which also surfaces
superiorly as well as inferiorly. Tiny free edge tear also noted
(series 4, image 13) involving the anterior body of the medial
meniscus.

Lateral meniscus:  Intact.

LIGAMENTS

Cruciates:  Intact ACL and PCL.

Collaterals: Medial collateral ligament is intact. Lateral
collateral ligament complex is intact.

CARTILAGE

Patellofemoral: Minimal fissuring of the cartilage overlying the
median ridge and lateral patellar facet. No focal chondral defect.

Medial: Mild partial-thickness cartilage loss of the medial
femorotibial compartment.

Lateral:  No focal chondral defect

Joint:  No joint effusion. Normal Hoffa's fat. No plical thickening.

Popliteal Fossa:  Small popliteal cyst.  Intact popliteus.

Extensor Mechanism:  Intact quadriceps tendon and patellar tendon.

Bones: No focal marrow signal abnormality. No fracture or
dislocation.

Other: None
IMPRESSION: 1. Tears of the posterior horn of the medial meniscus surfacing
superiorly and further anteriorly, closer to the free edge,
surfacing both superiorly and inferiorly (series 7, image 7).
2. Tiny free edge tear characterized by blunted appearance of the
anterior body of the medial meniscus (series 4, image 13).
3. Minimal fissuring of the patellar cartilage. No focal chondral
defects.
4. Small popliteal cyst.

## 2019-01-05 ENCOUNTER — Ambulatory Visit: Payer: BC Managed Care – PPO | Admitting: Family

## 2019-02-06 ENCOUNTER — Ambulatory Visit: Payer: BC Managed Care – PPO | Admitting: Family

## 2019-06-15 ENCOUNTER — Ambulatory Visit (INDEPENDENT_AMBULATORY_CARE_PROVIDER_SITE_OTHER): Payer: BC Managed Care – PPO

## 2019-06-15 ENCOUNTER — Ambulatory Visit: Payer: BC Managed Care – PPO | Admitting: Orthopedic Surgery

## 2019-06-15 ENCOUNTER — Other Ambulatory Visit: Payer: Self-pay

## 2019-06-15 ENCOUNTER — Encounter: Payer: Self-pay | Admitting: Orthopedic Surgery

## 2019-06-15 DIAGNOSIS — M25551 Pain in right hip: Secondary | ICD-10-CM

## 2019-06-16 ENCOUNTER — Encounter: Payer: Self-pay | Admitting: Orthopedic Surgery

## 2019-06-16 ENCOUNTER — Other Ambulatory Visit: Payer: Self-pay | Admitting: Surgical

## 2019-06-16 ENCOUNTER — Telehealth: Payer: Self-pay | Admitting: Orthopedic Surgery

## 2019-06-16 MED ORDER — MELOXICAM 15 MG PO TABS: 15.0000 mg | ORAL_TABLET | Freq: Every day | ORAL | 0 refills | Status: DC

## 2019-06-16 MED ORDER — TRAMADOL HCL 50 MG PO TABS: 50.0000 mg | ORAL_TABLET | Freq: Every evening | ORAL | 0 refills | Status: DC | PRN

## 2019-06-16 NOTE — Telephone Encounter (Signed)
Can either of you help me with this? Patient seen yesterday afternoon. Nothing dictated and I do not see medications were submitted. Please advise. Thanks.

## 2019-06-16 NOTE — Telephone Encounter (Signed)
Patient's wife Freda Munro called. Patient's wife stated that Dr. Randel Pigg office called in 2 new prescriptions. Patient's wife unsure of names of new prescriptions. Patient asking for a call back at [phone].

## 2019-06-16 NOTE — Telephone Encounter (Signed)
IC s/w patient and advised rx was submitted.

## 2019-06-16 NOTE — Telephone Encounter (Signed)
Ok for tramadol 1 po qhs prn # 30 and mobic 15 po q d # 30 pls call in thx

## 2019-06-18 ENCOUNTER — Encounter: Payer: Self-pay | Admitting: Orthopedic Surgery

## 2019-06-18 NOTE — Progress Notes (Signed)
Office Visit Note   [name]           Date of Birth: August 17, 1965           MRN: [id number] Visit Date: 06/15/2019 Requested by: Olive Bass, FNP [address] [place],  [place] [address] PCP: Olive Bass, FNP  Subjective: Chief Complaint  Patient presents with  . Right Hip - Pain    HPI: Patient presents for evaluation of right hip pain.  Has had pain in that hip for about 2 months.  Is not improving he tried stretching rest anti-inflammatories.  Localizes the pain just posterior to the trochanteric region.  Denies any numbness and tingling or radicular pain.  Has occasional groin pain but also describes decreased strength.  Unable to cross his leg without pretty severe pain.  He does work at the prison and walks around.  He states that he has been walking with a limp.  He also gets some weakness.  He is able to sleep on the right-hand side.  Does report some mild back pain.              ROS: All systems reviewed are negative as they relate to the chief complaint within the history of present illness.  Patient denies  fevers or chills.   Assessment & Plan: Visit Diagnoses:  1. Pain in right hip     Plan: Impression is right hip pain.  This could be occult arthritis because plain radiographs are pretty unremarkable.  Alternatively this may represent a labral tear or some type of gluteus medius tendon tear.  He does have a Trendelenburg type gait.  Plan is to try Mobic for the daytime tramadol at night and he needs MRI scan of the pelvis to evaluate the right hip for osteoarthritis versus labral tear versus abductor muscle tear.  Follow-Up Instructions: Return for after MRI.   Orders:  Orders Placed This Encounter  Procedures  . XR HIP UNILAT W OR W/O PELVIS 2-3 VIEWS RIGHT  . MR Pelvis w/o contrast   No orders of the defined types were placed in this encounter.     Procedures: No procedures performed   Clinical Data: No additional  findings.  Objective: Vital Signs: There were no vitals taken for this visit.  Physical Exam:   Constitutional: Patient appears well-developed HEENT:  Head: Normocephalic Eyes:EOM are normal Neck: Normal range of motion Cardiovascular: Normal rate Pulmonary/chest: Effort normal Neurologic: Patient is alert Skin: Skin is warm Psychiatric: Patient has normal mood and affect    Ortho Exam: Ortho exam demonstrates some mild Trendelenburg gait to the right with pretty symmetric hip flexion strength.  Ankle dorsiflexion plantarflexion is intact.  Patient has full range of motion of the knees and ankles.  No discrete trochanteric tenderness but he does have some tenderness in the posterior aspect of the trochanteric region.  No real hamstring ischial tuberosity tenderness on the right-hand side.  Specialty Comments:  No specialty comments available.  Imaging: No results found.   PMFS History: Patient Active Problem List   Diagnosis Date Noted  . Type 2 diabetes mellitus without complication ([facility]) 10/03/2018  . Osteochondritis dissecans of right talus   . Essential hypertension, benign 06/08/2016  . Routine general medical examination at a health care facility 04/12/2016  . Morbid obesity ([facility]) 04/12/2016  . Rash and nonspecific skin eruption 03/23/2015  . Obstructive apnea 04/21/2014   Past Medical History:  Diagnosis Date  . Chicken pox   .  Diabetes mellitus without complication ([place])   . History of kidney stones   . Hyperlipidemia   . Hypertension   . Sleep apnea    CiPaP at night    Family History  Problem Relation Age of Onset  . Prostate cancer Father   . Diabetes Father   . Cancer Sister   . Anesthesia problems Neg Hx   . Hypotension Neg Hx   . Malignant hyperthermia Neg Hx   . Pseudochol deficiency Neg Hx   . Colon cancer Neg Hx   . Colon polyps Neg Hx   . Esophageal cancer Neg Hx   . Rectal cancer Neg Hx   . Stomach cancer Neg Hx     Past Surgical  History:  Procedure Laterality Date  . ANKLE ARTHROSCOPY Right 06/21/2017   Procedure: RIGHT ANKLE ARTHROSCOPY WITH DEBRIDEMENT;  Surgeon: Newt Minion, MD;  Location: [facility];  Service: Orthopedics;  Laterality: Right;  . CYSTOSCOPY W/ URETERAL STENT PLACEMENT  08/07/2011   Procedure: CYSTOSCOPY WITH RETROGRADE PYELOGRAM/URETERAL STENT PLACEMENT;  Surgeon: Marissa Nestle, MD;  Location: [facility];  Service: Urology;  Laterality: Left;  Cystoscopy, Left Retrograde Pyelogram, Left Ureteral Ballon Dilation, Double J Stent Placement  . MENISCUS REPAIR     left knee 2018  . SHOULDER SURGERY     bilateral   Social History   Occupational History  . Occupation: Therapist, occupational  Tobacco Use  . Smoking status: Never Smoker  . Smokeless tobacco: Never Used  Substance and Sexual Activity  . Alcohol use: No  . Drug use: No  . Sexual activity: Not on file

## 2019-07-04 ENCOUNTER — Other Ambulatory Visit: Payer: Self-pay

## 2019-07-04 ENCOUNTER — Ambulatory Visit
Admission: RE | Admit: 2019-07-04 | Discharge: 2019-07-04 | Disposition: A | Discharge status: Home / Self Care | Payer: BC Managed Care – PPO | Source: Ambulatory Visit | Attending: Orthopedic Surgery | Admitting: Orthopedic Surgery

## 2019-07-04 DIAGNOSIS — M25551 Pain in right hip: Secondary | ICD-10-CM

## 2019-07-14 ENCOUNTER — Encounter: Payer: Self-pay | Admitting: Family

## 2019-07-15 ENCOUNTER — Ambulatory Visit (INDEPENDENT_AMBULATORY_CARE_PROVIDER_SITE_OTHER): Payer: BC Managed Care – PPO | Admitting: Family

## 2019-07-15 ENCOUNTER — Encounter: Payer: Self-pay | Admitting: Orthopedic Surgery

## 2019-07-15 ENCOUNTER — Ambulatory Visit: Payer: BC Managed Care – PPO | Admitting: Orthopedic Surgery

## 2019-07-15 ENCOUNTER — Other Ambulatory Visit: Payer: Self-pay | Admitting: Family

## 2019-07-15 ENCOUNTER — Encounter: Payer: Self-pay | Admitting: Family

## 2019-07-15 ENCOUNTER — Other Ambulatory Visit: Payer: Self-pay

## 2019-07-15 DIAGNOSIS — A084 Viral intestinal infection, unspecified: Secondary | ICD-10-CM | POA: Diagnosis not present

## 2019-07-15 DIAGNOSIS — E1165 Type 2 diabetes mellitus with hyperglycemia: Secondary | ICD-10-CM | POA: Diagnosis not present

## 2019-07-15 DIAGNOSIS — S76011D Strain of muscle, fascia and tendon of right hip, subsequent encounter: Secondary | ICD-10-CM | POA: Diagnosis not present

## 2019-07-15 MED ORDER — TRULICITY 0.75 MG/0.5ML ~~LOC~~ SOAJ: 0.7500 mg | SUBCUTANEOUS | 1 refills | Status: DC

## 2019-07-15 MED ORDER — METFORMIN HCL 500 MG PO TABS: 500.0000 mg | ORAL_TABLET | Freq: Two times a day (BID) | ORAL | 0 refills | Status: DC

## 2019-07-15 NOTE — Progress Notes (Signed)
James [name] is a 54 y.o. male with the following history as recorded in EpicCare:  Patient Active Problem List   Diagnosis Date Noted  . Type 2 diabetes mellitus without complication ([place]) 03/49/1791  . Osteochondritis dissecans of right talus   . Essential hypertension, benign 06/08/2016  . Routine general medical examination at a health care facility 04/12/2016  . Morbid obesity ([place]) 04/12/2016  . Rash and nonspecific skin eruption 03/23/2015  . Obstructive apnea 04/21/2014    Current Outpatient Medications  Medication Sig Dispense Refill  . aspirin EC 81 MG tablet Take 81 mg by mouth daily.    Marland Kitchen atorvastatin (LIPITOR) 20 MG tablet Take 1 tablet (20 mg total) by mouth daily. 90 tablet 0  . Dulaglutide (TRULICITY) 5.05 WP/7.9YI SOPN Inject 0.75 mg into the skin once a week. 4 pen 1  . glucose blood (ONE TOUCH ULTRA TEST) test strip 1 each by Other route 3 (three) times daily. Use as instructed 100 each 3  . lisinopril-hydrochlorothiazide (ZESTORETIC) 20-12.5 MG tablet Take 2 tablets by mouth daily. 180 tablet 1  . meloxicam (MOBIC) 15 MG tablet Take 1 tablet (15 mg total) by mouth daily. 30 tablet 0  . metFORMIN (GLUCOPHAGE) 500 MG tablet Take 1 tablet (500 mg total) by mouth 2 (two) times daily with a meal. 180 tablet 0  . Sod Picosulfate-Mag Ox-Cit Acd (CLENPIQ) 10-3.5-12 MG-GM -GM/160ML SOLN Take 1 kit by mouth as directed. 2 Bottle 0  . traMADol (ULTRAM) 50 MG tablet Take 1 tablet (50 mg total) by mouth at bedtime as needed. 30 tablet 0   No current facility-administered medications for this visit.    Allergies: Ceftriaxone  Past Medical History:  Diagnosis Date  . Chicken pox   . Diabetes mellitus without complication ([place])   . History of kidney stones   . Hyperlipidemia   . Hypertension   . Sleep apnea    CiPaP at night    Past Surgical History:  Procedure Laterality Date  . ANKLE ARTHROSCOPY Right 06/21/2017   Procedure: RIGHT ANKLE ARTHROSCOPY WITH DEBRIDEMENT;   Surgeon: Newt Minion, MD;  Location: [facility];  Service: Orthopedics;  Laterality: Right;  . CYSTOSCOPY W/ URETERAL STENT PLACEMENT  08/07/2011   Procedure: CYSTOSCOPY WITH RETROGRADE PYELOGRAM/URETERAL STENT PLACEMENT;  Surgeon: Marissa Nestle, MD;  [facility];  Service: Urology;  Laterality: Left;  Cystoscopy, Left Retrograde Pyelogram, Left Ureteral Ballon Dilation, Double J Stent Placement  . MENISCUS REPAIR     left knee 2018  . SHOULDER SURGERY     bilateral    Family History  Problem Relation Age of Onset  . Prostate cancer Father   . Diabetes Father   . Cancer Sister   . Anesthesia problems Neg Hx   . Hypotension Neg Hx   . Malignant hyperthermia Neg Hx   . Pseudochol deficiency Neg Hx   . Colon cancer Neg Hx   . Colon polyps Neg Hx   . Esophageal cancer Neg Hx   . Rectal cancer Neg Hx   . Stomach cancer Neg Hx     Social History   Tobacco Use  . Smoking status: Never Smoker  . Smokeless tobacco: Never Used  Substance Use Topics  . Alcohol use: No    Subjective:    I connected with James [name] on 07/15/19 at 11:20 AM EST by a video enabled telemedicine application and verified that I am speaking with the correct person using two identifiers. Provider in office/  patient is at home; provider and patient are only 2 people on video call.     I discussed the limitations of evaluation and management by telemedicine and the availability of in person appointments. The patient expressed understanding and agreed to proceed.  Patient originally scheduled appointment due to concerns for diarrhea/ nausea and vomiting; notes that he is actually feeling much better today however; Is overdue for in office visit to follow-up on his diabetes; notes that his work schedule has prevented him being able to schedule follow-up; last in office appointment was 04/2018 and last virtual was 10/2018; Hgba1c in April was at 7.1; he admits since he was last seen that he has not been able  to exercise due to hip injury ( will require surgery) and his blood sugars have been very high- between 200-300; only taking Metformin once per day; not using his insulin that he has taken in the past;     Objective:  There were no vitals filed for this visit.  General: Well developed, well nourished, in no acute distress  Skin : Warm and dry.  Head: Normocephalic and atraumatic  Lungs: Respirations unlabored;  Neurologic: Alert and oriented; speech intact; face symmetrical;   Assessment:  1. Viral gastroenteritis   2. Uncontrolled type 2 diabetes mellitus with hyperglycemia ([place])     Plan:  1. Per patient, symptoms have resolved with no concerns today; 2. Increase Metformin to 500 mg bid and add Trulicity weekly; he will need OV in 1 month; stressed importance of regular in office follow-up/ routine labs.   No follow-ups on file.  No orders of the defined types were placed in this encounter.   Requested Prescriptions   Signed Prescriptions Disp Refills  . Dulaglutide (TRULICITY) 4.79 VY/7.2JL SOPN 4 pen 1    Sig: Inject 0.75 mg into the skin once a week.  . metFORMIN (GLUCOPHAGE) 500 MG tablet 180 tablet 0    Sig: Take 1 tablet (500 mg total) by mouth 2 (two) times daily with a meal.

## 2019-07-16 ENCOUNTER — Encounter: Payer: Self-pay | Admitting: Family

## 2019-07-18 ENCOUNTER — Encounter: Payer: Self-pay | Admitting: Family

## 2019-07-18 ENCOUNTER — Other Ambulatory Visit: Payer: Self-pay | Admitting: Family

## 2019-07-19 ENCOUNTER — Encounter: Payer: Self-pay | Admitting: Orthopedic Surgery

## 2019-07-19 NOTE — Progress Notes (Signed)
Office Visit Note   [name]           Date of Birth: 10-04-1965           MRN: [id number] Visit Date: 07/15/2019 Requested by: Marrian Salvage, [place],  [place] [address] PCP: Marrian Salvage, FNP  Subjective: No chief complaint on file.   HPI: James [name] is a patient with right hip pain.  Since have seen was had an MRI scan.  Him with him.  His limp is better he does live in [place].  Does not report a discrete injury but essentially just woke up with pain.  This happened about 2 months ago.  Stairs are difficult for him.  Medications are not helpful.  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  MRI scan does show gluteus minimus tear with near full-thickness component but no significant retraction.  He does localizes tenderness to the trochanteric region.              ROS: All systems reviewed are negative as they relate to the chief complaint within the history of present illness.  Patient denies  fevers or chills.   Assessment & Plan: Visit Diagnoses:  1. Tear of right gluteus minimus tendon, subsequent encounter     Plan: Impression is gluteus minimus tear which is described and shown to the patient on his MRI scan.  I think that does account nicely for his symptoms of weakness and pain which is more of a Trendelenburg gait type pattern.  He does feel like he is losing balance.  I think the best option for him would be between operative and nonoperative treatments.  Nonoperative treatment would be observation rehab.  Operative treatment would be an open tendon repair.  I did talk with one of the surgeons in town he does hip arthroscopy and this type of tendon repair at least for him is an open procedure.  I would tend to agree with that assessment.  I will talk with him next week and we can decide after a period of 5 days of rest whether or  not we can proceed with nonoperative treatment versus open operative treatment in a period of rehabilitation and guarded weightbearing.  Follow-Up Instructions: Return if symptoms worsen or fail to improve.   Orders:  No orders of the defined types were placed in this encounter.  No orders of the defined types were placed in this encounter.     Procedures: No procedures performed   Clinical Data: No additional findings.  Objective: Vital Signs: There were no vitals taken for this visit.  Physical Exam:   Constitutional: Patient appears well-developed HEENT:  Head: Normocephalic Eyes:EOM are normal Neck: Normal range of motion Cardiovascular: Normal rate Pulmonary/chest: Effort normal Neurologic: Patient is alert Skin: Skin is warm Psychiatric: Patient has normal mood and affect    Ortho Exam: Ortho exam demonstrates improved gait which is less of an Trendelenburg gait that was last clinic visit.  He has good abduction and adduction but slightly weaker hip flexion strength on the right compared to the left.  Does describe weakness and feelings of giving way when he is standing.  Does have to use a rail when he is going up and down stairs.  No groin pain with internal X rotation leg.  MRI scan did not show much  in the way of arthritis or AVN or joint effusion in that right hip.  Specialty Comments:  No specialty comments available.  Imaging: No results found.   PMFS History: Patient Active Problem List   Diagnosis Date Noted  . Type 2 diabetes mellitus without complication ([facility]) 10/03/2018  . Osteochondritis dissecans of right talus   . Essential hypertension, benign 06/08/2016  . Routine general medical examination at a health care facility 04/12/2016  . Morbid obesity ([facility]) 04/12/2016  . Rash and nonspecific skin eruption 03/23/2015  . Obstructive apnea 04/21/2014   Past Medical History:  Diagnosis Date  . Chicken pox   . Diabetes mellitus without  complication ([facility])   . History of kidney stones   . Hyperlipidemia   . Hypertension   . Sleep apnea    CiPaP at night    Family History  Problem Relation Age of Onset  . Prostate cancer Father   . Diabetes Father   . Cancer Sister   . Anesthesia problems Neg Hx   . Hypotension Neg Hx   . Malignant hyperthermia Neg Hx   . Pseudochol deficiency Neg Hx   . Colon cancer Neg Hx   . Colon polyps Neg Hx   . Esophageal cancer Neg Hx   . Rectal cancer Neg Hx   . Stomach cancer Neg Hx     Past Surgical History:  Procedure Laterality Date  . ANKLE ARTHROSCOPY Right 06/21/2017   Procedure: RIGHT ANKLE ARTHROSCOPY WITH DEBRIDEMENT;  Surgeon: Nadara Mustard, MD;  Location[facility];  Service: Orthopedics;  Laterality: Right;  . CYSTOSCOPY W/ URETERAL STENT PLACEMENT  08/07/2011   Procedure: CYSTOSCOPY WITH RETROGRADE PYELOGRAM/URETERAL STENT PLACEMENT;  Surgeon: Ky Barban, MD;  Location[facility];  Service: Urology;  Laterality: Left;  Cystoscopy, Left Retrograde Pyelogram, Left Ureteral Ballon Dilation, Double J Stent Placement  . MENISCUS REPAIR     left knee 2018  . SHOULDER SURGERY     bilateral   Social History   Occupational History  . Occupation: Data processing manager  Tobacco Use  . Smoking status: Never Smoker  . Smokeless tobacco: Never Used  Substance and Sexual Activity  . Alcohol use: No  . Drug use: No  . Sexual activity: Not on file

## 2019-07-20 ENCOUNTER — Telehealth: Payer: Self-pay | Admitting: Orthopedic Surgery

## 2019-07-20 ENCOUNTER — Encounter: Payer: Self-pay | Admitting: Orthopedic Surgery

## 2019-07-20 NOTE — Telephone Encounter (Signed)
Patient was told to call Dr August Saucer this morning about potential surgery.  [phone]

## 2019-07-20 NOTE — Progress Notes (Signed)
Reminder to call patient

## 2019-07-20 NOTE — Telephone Encounter (Signed)
Please advise. Thanks.  

## 2019-07-22 ENCOUNTER — Encounter: Payer: Self-pay | Admitting: Orthopedic Surgery

## 2019-07-22 ENCOUNTER — Other Ambulatory Visit: Payer: Self-pay

## 2019-07-22 ENCOUNTER — Encounter (HOSPITAL_BASED_OUTPATIENT_CLINIC_OR_DEPARTMENT_OTHER): Payer: Self-pay | Admitting: Orthopedic Surgery

## 2019-07-22 NOTE — Telephone Encounter (Signed)
I called him.  We discussed the repair.  The risk and benefits.  The time he would need to be on a scooter which would be 1 month.  He is agreeable to surgery.  We will get that set up in the near future.  No personal or family history of DVT or pulmonary embolism.

## 2019-07-23 NOTE — Telephone Encounter (Signed)
Can you send him a surgery date thank you

## 2019-07-24 ENCOUNTER — Other Ambulatory Visit: Payer: Self-pay | Admitting: Surgical

## 2019-07-24 ENCOUNTER — Other Ambulatory Visit (HOSPITAL_COMMUNITY)
Admission: RE | Admit: 2019-07-24 | Discharge: 2019-07-24 | Disposition: A | Discharge status: Home / Self Care | Payer: BC Managed Care – PPO | Source: Ambulatory Visit | Attending: Orthopedic Surgery | Admitting: Orthopedic Surgery

## 2019-07-24 ENCOUNTER — Other Ambulatory Visit: Payer: Self-pay

## 2019-07-24 ENCOUNTER — Encounter (HOSPITAL_BASED_OUTPATIENT_CLINIC_OR_DEPARTMENT_OTHER)
Admission: RE | Admit: 2019-07-24 | Discharge: 2019-07-24 | Disposition: A | Discharge status: Home / Self Care | Payer: BC Managed Care – PPO | Source: Ambulatory Visit | Attending: Orthopedic Surgery | Admitting: Orthopedic Surgery

## 2019-07-24 DIAGNOSIS — Z20822 Contact with and (suspected) exposure to covid-19: Secondary | ICD-10-CM | POA: Insufficient documentation

## 2019-07-24 DIAGNOSIS — S76011A Strain of muscle, fascia and tendon of right hip, initial encounter: Secondary | ICD-10-CM | POA: Diagnosis present

## 2019-07-24 DIAGNOSIS — Z7982 Long term (current) use of aspirin: Secondary | ICD-10-CM | POA: Diagnosis not present

## 2019-07-24 DIAGNOSIS — Z6841 Body Mass Index (BMI) 40.0 and over, adult: Secondary | ICD-10-CM | POA: Diagnosis not present

## 2019-07-24 DIAGNOSIS — E785 Hyperlipidemia, unspecified: Secondary | ICD-10-CM | POA: Diagnosis not present

## 2019-07-24 DIAGNOSIS — S76011D Strain of muscle, fascia and tendon of right hip, subsequent encounter: Secondary | ICD-10-CM

## 2019-07-24 DIAGNOSIS — Z7984 Long term (current) use of oral hypoglycemic drugs: Secondary | ICD-10-CM | POA: Diagnosis not present

## 2019-07-24 DIAGNOSIS — E119 Type 2 diabetes mellitus without complications: Secondary | ICD-10-CM | POA: Diagnosis not present

## 2019-07-24 DIAGNOSIS — I493 Ventricular premature depolarization: Secondary | ICD-10-CM | POA: Diagnosis not present

## 2019-07-24 DIAGNOSIS — Z01818 Encounter for other preprocedural examination: Secondary | ICD-10-CM | POA: Diagnosis present

## 2019-07-24 DIAGNOSIS — I10 Essential (primary) hypertension: Secondary | ICD-10-CM | POA: Diagnosis not present

## 2019-07-24 DIAGNOSIS — Z79899 Other long term (current) drug therapy: Secondary | ICD-10-CM | POA: Diagnosis not present

## 2019-07-24 DIAGNOSIS — X58XXXA Exposure to other specified factors, initial encounter: Secondary | ICD-10-CM | POA: Diagnosis not present

## 2019-07-24 DIAGNOSIS — G4730 Sleep apnea, unspecified: Secondary | ICD-10-CM | POA: Diagnosis not present

## 2019-07-24 DIAGNOSIS — Z87442 Personal history of urinary calculi: Secondary | ICD-10-CM | POA: Diagnosis not present

## 2019-07-24 LAB — CBC
HCT: 44.2 % (ref 39.0–52.0)
Hemoglobin: 15.3 g/dL (ref 13.0–17.0)
MCH: 29.7 pg (ref 26.0–34.0)
MCHC: 34.6 g/dL (ref 30.0–36.0)
MCV: 85.8 fL (ref 80.0–100.0)
Platelets: 250 10*3/uL (ref 150–400)
RBC: 5.15 MIL/uL (ref 4.22–5.81)
RDW: 13.1 % (ref 11.5–15.5)
WBC: 7.6 10*3/uL (ref 4.0–10.5)
nRBC: 0 % (ref 0.0–0.2)

## 2019-07-24 LAB — BASIC METABOLIC PANEL
Anion gap: 10 (ref 5–15)
BUN: 18 mg/dL (ref 6–20)
CO2: 25 mmol/L (ref 22–32)
Calcium: 9.4 mg/dL (ref 8.9–10.3)
Chloride: 103 mmol/L (ref 98–111)
Creatinine, Ser: 0.9 mg/dL (ref 0.61–1.24)
GFR calc Af Amer: >60 mL/min (ref >60)
GFR calc non Af Amer: >60 mL/min (ref >60)
Glucose, Bld: 152 mg/dL — ABNORMAL HIGH (ref 70–99)
Potassium: 4.1 mmol/L (ref 3.5–5.1)
Sodium: 138 mmol/L (ref 135–145)

## 2019-07-24 LAB — SARS CORONAVIRUS 2 (TAT 6-24 HRS): SARS Coronavirus 2: NEGATIVE

## 2019-07-24 NOTE — Progress Notes (Signed)

## 2019-07-27 ENCOUNTER — Other Ambulatory Visit: Payer: Self-pay | Admitting: Surgical

## 2019-07-28 ENCOUNTER — Ambulatory Visit (HOSPITAL_BASED_OUTPATIENT_CLINIC_OR_DEPARTMENT_OTHER)
Admission: RE | Admit: 2019-07-28 | Discharge: 2019-07-28 | Disposition: A | Discharge status: Home / Self Care | Payer: BC Managed Care – PPO | Attending: Orthopedic Surgery | Admitting: Orthopedic Surgery

## 2019-07-28 ENCOUNTER — Encounter (HOSPITAL_BASED_OUTPATIENT_CLINIC_OR_DEPARTMENT_OTHER)
Admission: RE | Disposition: A | Discharge status: Home / Self Care | Payer: Self-pay | Source: Home / Self Care | Attending: Orthopedic Surgery

## 2019-07-28 ENCOUNTER — Ambulatory Visit (HOSPITAL_BASED_OUTPATIENT_CLINIC_OR_DEPARTMENT_OTHER): Payer: Self-pay | Admitting: Anesthesiology

## 2019-07-28 ENCOUNTER — Encounter (HOSPITAL_BASED_OUTPATIENT_CLINIC_OR_DEPARTMENT_OTHER): Payer: Self-pay | Admitting: Orthopedic Surgery

## 2019-07-28 ENCOUNTER — Encounter (HOSPITAL_BASED_OUTPATIENT_CLINIC_OR_DEPARTMENT_OTHER): Payer: Self-pay | Admitting: Anesthesiology

## 2019-07-28 ENCOUNTER — Other Ambulatory Visit: Payer: Self-pay

## 2019-07-28 DIAGNOSIS — S76011D Strain of muscle, fascia and tendon of right hip, subsequent encounter: Secondary | ICD-10-CM | POA: Diagnosis not present

## 2019-07-28 DIAGNOSIS — I10 Essential (primary) hypertension: Secondary | ICD-10-CM | POA: Insufficient documentation

## 2019-07-28 DIAGNOSIS — Z7984 Long term (current) use of oral hypoglycemic drugs: Secondary | ICD-10-CM | POA: Insufficient documentation

## 2019-07-28 DIAGNOSIS — X58XXXA Exposure to other specified factors, initial encounter: Secondary | ICD-10-CM | POA: Insufficient documentation

## 2019-07-28 DIAGNOSIS — S76011A Strain of muscle, fascia and tendon of right hip, initial encounter: Secondary | ICD-10-CM | POA: Insufficient documentation

## 2019-07-28 DIAGNOSIS — Z87442 Personal history of urinary calculi: Secondary | ICD-10-CM | POA: Insufficient documentation

## 2019-07-28 DIAGNOSIS — E785 Hyperlipidemia, unspecified: Secondary | ICD-10-CM | POA: Insufficient documentation

## 2019-07-28 DIAGNOSIS — Z6841 Body Mass Index (BMI) 40.0 and over, adult: Secondary | ICD-10-CM | POA: Insufficient documentation

## 2019-07-28 DIAGNOSIS — G4730 Sleep apnea, unspecified: Secondary | ICD-10-CM | POA: Insufficient documentation

## 2019-07-28 DIAGNOSIS — I493 Ventricular premature depolarization: Secondary | ICD-10-CM | POA: Insufficient documentation

## 2019-07-28 DIAGNOSIS — Z79899 Other long term (current) drug therapy: Secondary | ICD-10-CM | POA: Insufficient documentation

## 2019-07-28 DIAGNOSIS — E119 Type 2 diabetes mellitus without complications: Secondary | ICD-10-CM | POA: Insufficient documentation

## 2019-07-28 DIAGNOSIS — Z7982 Long term (current) use of aspirin: Secondary | ICD-10-CM | POA: Insufficient documentation

## 2019-07-28 HISTORY — PX: GLUTEUS MINIMUS REPAIR: SHX5843

## 2019-07-28 LAB — GLUCOSE, CAPILLARY
Glucose-Capillary: 104 mg/dL — ABNORMAL HIGH (ref 70–99)
Glucose-Capillary: 150 mg/dL — ABNORMAL HIGH (ref 70–99)

## 2019-07-28 SURGERY — REPAIR, TENDON, GLUTEUS MINIMUS
Anesthesia: General | Site: Hip | Laterality: Right

## 2019-07-28 MED ORDER — FENTANYL CITRATE (PF) 100 MCG/2ML IJ SOLN
INTRAMUSCULAR | Status: AC
  Filled 2019-07-28: qty 2

## 2019-07-28 MED ORDER — PROPOFOL 10 MG/ML IV BOLUS
INTRAVENOUS | Status: DC | PRN
  Administered 2019-07-28: 200 mg via INTRAVENOUS

## 2019-07-28 MED ORDER — CHLORHEXIDINE GLUCONATE 4 % EX LIQD: 60.0000 mL | Freq: Once | CUTANEOUS | Status: DC

## 2019-07-28 MED ORDER — VANCOMYCIN HCL 1500 MG/300ML IV SOLN
1500.0000 mg | INTRAVENOUS | Status: AC
  Administered 2019-07-28: 1500 mg via INTRAVENOUS

## 2019-07-28 MED ORDER — TRANEXAMIC ACID-NACL 1000-0.7 MG/100ML-% IV SOLN
INTRAVENOUS | Status: AC
  Filled 2019-07-28: qty 100

## 2019-07-28 MED ORDER — BUPIVACAINE HCL (PF) 0.5 % IJ SOLN
INTRAMUSCULAR | Status: AC
  Filled 2019-07-28: qty 30

## 2019-07-28 MED ORDER — SUCCINYLCHOLINE CHLORIDE 200 MG/10ML IV SOSY
PREFILLED_SYRINGE | INTRAVENOUS | Status: AC
  Filled 2019-07-28: qty 10

## 2019-07-28 MED ORDER — VANCOMYCIN HCL IN DEXTROSE 500-5 MG/100ML-% IV SOLN
INTRAVENOUS | Status: AC
  Filled 2019-07-28: qty 100

## 2019-07-28 MED ORDER — DEXAMETHASONE SODIUM PHOSPHATE 10 MG/ML IJ SOLN
INTRAMUSCULAR | Status: AC
  Filled 2019-07-28: qty 1

## 2019-07-28 MED ORDER — MIDAZOLAM HCL 2 MG/2ML IJ SOLN: 1.0000 mg | INTRAMUSCULAR | Status: DC | PRN

## 2019-07-28 MED ORDER — SUGAMMADEX SODIUM 500 MG/5ML IV SOLN
INTRAVENOUS | Status: DC | PRN
  Administered 2019-07-28: 300 mg via INTRAVENOUS

## 2019-07-28 MED ORDER — LIDOCAINE 2% (20 MG/ML) 5 ML SYRINGE
INTRAMUSCULAR | Status: AC
  Filled 2019-07-28: qty 5

## 2019-07-28 MED ORDER — MORPHINE SULFATE (PF) 4 MG/ML IV SOLN
INTRAVENOUS | Status: AC
  Filled 2019-07-28: qty 2

## 2019-07-28 MED ORDER — LACTATED RINGERS IV SOLN: INTRAVENOUS | Status: DC

## 2019-07-28 MED ORDER — PROPOFOL 10 MG/ML IV BOLUS
INTRAVENOUS | Status: AC
  Filled 2019-07-28: qty 20

## 2019-07-28 MED ORDER — OXYCODONE HCL 5 MG/5ML PO SOLN: 5.0000 mg | Freq: Once | ORAL | Status: DC | PRN

## 2019-07-28 MED ORDER — TRANEXAMIC ACID-NACL 1000-0.7 MG/100ML-% IV SOLN
INTRAVENOUS | Status: DC | PRN
  Administered 2019-07-28: 1000 mg via INTRAVENOUS

## 2019-07-28 MED ORDER — 0.9 % SODIUM CHLORIDE (POUR BTL) OPTIME
TOPICAL | Status: DC | PRN
  Administered 2019-07-28: 4000 mL

## 2019-07-28 MED ORDER — PHENYLEPHRINE 40 MCG/ML (10ML) SYRINGE FOR IV PUSH (FOR BLOOD PRESSURE SUPPORT)
PREFILLED_SYRINGE | INTRAVENOUS | Status: AC
  Filled 2019-07-28: qty 10

## 2019-07-28 MED ORDER — CLONIDINE HCL (ANALGESIA) 100 MCG/ML EP SOLN
EPIDURAL | Status: DC | PRN
  Administered 2019-07-28: 100 ug

## 2019-07-28 MED ORDER — MORPHINE SULFATE 4 MG/ML IJ SOLN
INTRAMUSCULAR | Status: DC | PRN
  Administered 2019-07-28: 8 mg via INTRAVENOUS

## 2019-07-28 MED ORDER — ONDANSETRON HCL 4 MG/2ML IJ SOLN
INTRAMUSCULAR | Status: AC
  Filled 2019-07-28: qty 2

## 2019-07-28 MED ORDER — MIDAZOLAM HCL 5 MG/5ML IJ SOLN
INTRAMUSCULAR | Status: DC | PRN
  Administered 2019-07-28: 2 mg via INTRAVENOUS

## 2019-07-28 MED ORDER — ONDANSETRON HCL 4 MG/2ML IJ SOLN
4.0000 mg | Freq: Four times a day (QID) | INTRAMUSCULAR | Status: AC | PRN
  Administered 2019-07-28: 4 mg via INTRAVENOUS

## 2019-07-28 MED ORDER — ROCURONIUM BROMIDE 10 MG/ML (PF) SYRINGE
PREFILLED_SYRINGE | INTRAVENOUS | Status: AC
  Filled 2019-07-28: qty 10

## 2019-07-28 MED ORDER — SUCCINYLCHOLINE CHLORIDE 20 MG/ML IJ SOLN
INTRAMUSCULAR | Status: DC | PRN
  Administered 2019-07-28: 120 mg via INTRAVENOUS

## 2019-07-28 MED ORDER — FENTANYL CITRATE (PF) 100 MCG/2ML IJ SOLN
INTRAMUSCULAR | Status: DC | PRN
  Administered 2019-07-28: 100 ug via INTRAVENOUS
  Administered 2019-07-28 (×3): 50 ug via INTRAVENOUS

## 2019-07-28 MED ORDER — SCOPOLAMINE 1 MG/3DAYS TD PT72
MEDICATED_PATCH | TRANSDERMAL | Status: AC
  Filled 2019-07-28: qty 1

## 2019-07-28 MED ORDER — LIDOCAINE HCL (CARDIAC) PF 100 MG/5ML IV SOSY
PREFILLED_SYRINGE | INTRAVENOUS | Status: DC | PRN
  Administered 2019-07-28: 80 mg via INTRAVENOUS

## 2019-07-28 MED ORDER — FENTANYL CITRATE (PF) 100 MCG/2ML IJ SOLN: 50.0000 ug | INTRAMUSCULAR | Status: DC | PRN

## 2019-07-28 MED ORDER — ROCURONIUM BROMIDE 100 MG/10ML IV SOLN
INTRAVENOUS | Status: DC | PRN
  Administered 2019-07-28: 10 mg via INTRAVENOUS
  Administered 2019-07-28: 50 mg via INTRAVENOUS
  Administered 2019-07-28: 10 mg via INTRAVENOUS

## 2019-07-28 MED ORDER — METHOCARBAMOL 500 MG PO TABS: 500.0000 mg | ORAL_TABLET | Freq: Three times a day (TID) | ORAL | 0 refills | Status: DC | PRN

## 2019-07-28 MED ORDER — SCOPOLAMINE 1 MG/3DAYS TD PT72
1.0000 | MEDICATED_PATCH | TRANSDERMAL | Status: DC
  Administered 2019-07-28: 1.5 mg via TRANSDERMAL

## 2019-07-28 MED ORDER — BUPIVACAINE HCL (PF) 0.5 % IJ SOLN
INTRAMUSCULAR | Status: DC | PRN
  Administered 2019-07-28: 50 mL

## 2019-07-28 MED ORDER — HYDROMORPHONE HCL 1 MG/ML IJ SOLN
INTRAMUSCULAR | Status: AC
  Filled 2019-07-28: qty 0.5

## 2019-07-28 MED ORDER — FENTANYL CITRATE (PF) 100 MCG/2ML IJ SOLN: 25.0000 ug | INTRAMUSCULAR | Status: DC | PRN

## 2019-07-28 MED ORDER — VANCOMYCIN HCL IN DEXTROSE 1-5 GM/200ML-% IV SOLN
INTRAVENOUS | Status: AC
  Filled 2019-07-28: qty 200

## 2019-07-28 MED ORDER — CLONIDINE HCL (ANALGESIA) 100 MCG/ML EP SOLN
EPIDURAL | Status: AC
  Filled 2019-07-28: qty 10

## 2019-07-28 MED ORDER — CELECOXIB 100 MG PO CAPS: 100.0000 mg | ORAL_CAPSULE | Freq: Two times a day (BID) | ORAL | 0 refills | Status: AC

## 2019-07-28 MED ORDER — OXYCODONE HCL 5 MG PO TABS: 5.0000 mg | ORAL_TABLET | Freq: Once | ORAL | Status: DC | PRN

## 2019-07-28 MED ORDER — PHENYLEPHRINE HCL (PRESSORS) 10 MG/ML IV SOLN
INTRAVENOUS | Status: DC | PRN
  Administered 2019-07-28 (×7): 80 ug via INTRAVENOUS

## 2019-07-28 MED ORDER — OXYCODONE HCL 5 MG PO TABS: 5.0000 mg | ORAL_TABLET | ORAL | 0 refills | Status: AC | PRN

## 2019-07-28 MED ORDER — MIDAZOLAM HCL 2 MG/2ML IJ SOLN
INTRAMUSCULAR | Status: AC
  Filled 2019-07-28: qty 2

## 2019-07-28 SURGICAL SUPPLY — 68 items
ANCHOR FBRTK 2.6 SUTURETAP 1.3 (Anchor) ×8 IMPLANT
ANCHOR SUT BIO SW 4.75X19.1 (Anchor) ×2 IMPLANT
BLADE KNIFE PERSONA 10 (BLADE) ×2 IMPLANT
BLADE SURG 10 STRL SS (BLADE) ×2 IMPLANT
BLADE SURG 15 STRL LF DISP TIS (BLADE) ×4 IMPLANT
BLADE SURG 15 STRL SS (BLADE) ×4
CANISTER SUCT 1200ML W/VALVE (MISCELLANEOUS) IMPLANT
CLSR STERI-STRIP ANTIMIC 1/2X4 (GAUZE/BANDAGES/DRESSINGS) ×4 IMPLANT
COVER BACK TABLE 60X90IN (DRAPES) IMPLANT
COVER WAND RF STERILE (DRAPES) IMPLANT
DECANTER SPIKE VIAL GLASS SM (MISCELLANEOUS) IMPLANT
DRAPE IMP U-DRAPE 54X76 (DRAPES) ×2 IMPLANT
DRAPE INCISE IOBAN 66X45 STRL (DRAPES) ×6 IMPLANT
DRAPE SURG 17X23 STRL (DRAPES) IMPLANT
DRAPE U-SHAPE 47X51 STRL (DRAPES) ×4 IMPLANT
DRAPE U-SHAPE 76X120 STRL (DRAPES) ×4 IMPLANT
DRSG AQUACEL AG ADV 3.5X10 (GAUZE/BANDAGES/DRESSINGS) ×2 IMPLANT
DRSG PAD ABDOMINAL 8X10 ST (GAUZE/BANDAGES/DRESSINGS) IMPLANT
DURAPREP 26ML APPLICATOR (WOUND CARE) ×4 IMPLANT
ELECT BLADE 4.0 EZ CLEAN MEGAD (MISCELLANEOUS) ×2
ELECT REM PT RETURN 9FT ADLT (ELECTROSURGICAL) ×2
ELECTRODE BLDE 4.0 EZ CLN MEGD (MISCELLANEOUS) ×1 IMPLANT
ELECTRODE REM PT RTRN 9FT ADLT (ELECTROSURGICAL) ×1 IMPLANT
GAUZE 4X4 16PLY RFD (DISPOSABLE) IMPLANT
GAUZE XEROFORM 1X8 LF (GAUZE/BANDAGES/DRESSINGS) IMPLANT
GLOVE BIOGEL PI IND STRL 7.0 (GLOVE) ×1 IMPLANT
GLOVE BIOGEL PI IND STRL 8 (GLOVE) ×1 IMPLANT
GLOVE BIOGEL PI INDICATOR 7.0 (GLOVE) ×1
GLOVE BIOGEL PI INDICATOR 8 (GLOVE) ×1
GLOVE SURG ORTHO 8.0 STRL STRW (GLOVE) ×2 IMPLANT
GLOVE SURG SS PI 6.5 STRL IVOR (GLOVE) ×2 IMPLANT
GLOVE SURG SS PI 7.0 STRL IVOR (GLOVE) ×2 IMPLANT
GOWN STRL REUS W/ TWL LRG LVL3 (GOWN DISPOSABLE) ×2 IMPLANT
GOWN STRL REUS W/TWL LRG LVL3 (GOWN DISPOSABLE) ×2
GOWN STRL REUS W/TWL XL LVL3 (GOWN DISPOSABLE) ×2 IMPLANT
IMP SYS 2ND FIX PEEK 4.75X19.1 (Miscellaneous) ×2 IMPLANT
IMPL SYS 2ND FX PEEK 4.75X19.1 (Miscellaneous) ×1 IMPLANT
KIT BONE MRW ASP ANGEL CPRP (KITS) ×2 IMPLANT
MANIFOLD NEPTUNE II (INSTRUMENTS) ×2 IMPLANT
NDL SUT 6 .5 CRC .975X.05 MAYO (NEEDLE) ×1 IMPLANT
NEEDLE MAYO TAPER (NEEDLE) ×1
NEEDLE MAYO TROCAR (NEEDLE) ×2 IMPLANT
NS IRRIG 1000ML POUR BTL (IV SOLUTION) ×2 IMPLANT
PACK ARTHROSCOPY DSU (CUSTOM PROCEDURE TRAY) ×2 IMPLANT
PACK BASIN DAY SURGERY FS (CUSTOM PROCEDURE TRAY) ×2 IMPLANT
PENCIL SMOKE EVACUATOR (MISCELLANEOUS) ×2 IMPLANT
PILLOW ABDUCTION MEDIUM (MISCELLANEOUS) ×2 IMPLANT
SHEET MEDIUM DRAPE 40X70 STRL (DRAPES) ×2 IMPLANT
SPONGE LAP 18X18 RF (DISPOSABLE) ×10 IMPLANT
SUCTION FRAZIER HANDLE 10FR (MISCELLANEOUS) ×1
SUCTION TUBE FRAZIER 10FR DISP (MISCELLANEOUS) ×1 IMPLANT
SUT MNCRL AB 3-0 PS2 27 (SUTURE) ×2 IMPLANT
SUT VIC AB 0 CT1 18XCR BRD8 (SUTURE) IMPLANT
SUT VIC AB 0 CT1 27 (SUTURE) ×1
SUT VIC AB 0 CT1 27XCR 8 STRN (SUTURE) ×1 IMPLANT
SUT VIC AB 0 CT1 8-18 (SUTURE)
SUT VIC AB 1 CT1 27 (SUTURE) ×5
SUT VIC AB 1 CT1 27XBRD ANBCTR (SUTURE) ×5 IMPLANT
SUT VIC AB 2-0 CT1 27 (SUTURE) ×3
SUT VIC AB 2-0 CT1 TAPERPNT 27 (SUTURE) ×3 IMPLANT
SUT VIC AB 3-0 CT1 27 (SUTURE) ×1
SUT VIC AB 3-0 CT1 27XBRD (SUTURE) ×1 IMPLANT
SUT VICRYL 0 UR6 27IN ABS (SUTURE) ×12 IMPLANT
SYR BULB 3OZ (MISCELLANEOUS) ×2 IMPLANT
SYR TB 1ML 25GX5/8 (SYRINGE) ×2 IMPLANT
TOWEL GREEN STERILE FF (TOWEL DISPOSABLE) ×4 IMPLANT
UNDERPAD 30X36 HEAVY ABSORB (UNDERPADS AND DIAPERS) ×2 IMPLANT
YANKAUER SUCT BULB TIP NO VENT (SUCTIONS) ×2 IMPLANT

## 2019-07-28 NOTE — Anesthesia Procedure Notes (Signed)
Procedure Name: Intubation Date/Time: 07/28/2019 12:14 PM Performed by: Cleda Clarks, CRNA Pre-anesthesia Checklist: Patient identified, Emergency Drugs available, Suction available and Patient being monitored Patient Re-evaluated:Patient Re-evaluated prior to induction Oxygen Delivery Method: Circle system utilized Preoxygenation: Pre-oxygenation with 100% oxygen Induction Type: IV induction Ventilation: Mask ventilation without difficulty Laryngoscope Size: Miller and 2 Tube type: Oral Tube size: 7.0 mm Number of attempts: 1 Airway Equipment and Method: Stylet and Oral airway Placement Confirmation: ETT inserted through vocal cords under direct vision,  positive ETCO2 and breath sounds checked- equal and bilateral Secured at: 23 cm Tube secured with: Tape Dental Injury: Teeth and Oropharynx as per pre-operative assessment

## 2019-07-28 NOTE — Brief Op Note (Signed)
   07/28/2019  2:58 PM  PATIENT:  James [name]  54 y.o. male  PRE-OPERATIVE DIAGNOSIS:  RIGHT HIP TENDON TEAR GLUTEUS medius and gluteus minimus  POST-OPERATIVE DIAGNOSIS:  RIGHT HIP TENDON TEAR Gluteus medius and gluteus minimus  PROCEDURE:  Procedure(s): RIGHT HIP TENDON TEAR REPAIR  SURGEON:  Surgeon(s): August Saucer, Corrie Mckusick, MD  ASSISTANT: Karenann Cai, PA  ANESTHESIA:   general  EBL: 100 ml    Total I/O In: 1000 [I.V.:1000] Out: 200 [Blood:200]  BLOOD ADMINISTERED: none  DRAINS: none   LOCAL MEDICATIONS USED: Marcaine morphine clonidine   SPECIMEN:  No Specimen  COUNTS:  YES  TOURNIQUET:  * No tourniquets in log *  DICTATION: .Other Dictation: Dictation Number [id number]   PLAN OF CARE: Admit for overnight observation  PATIENT DISPOSITION:  PACU - hemodynamically stable

## 2019-07-28 NOTE — Anesthesia Preprocedure Evaluation (Signed)
Anesthesia Evaluation  Patient identified by MRN, date of birth, ID band Patient awake    Reviewed: Allergy & Precautions, H&P , NPO status , Patient's Chart, lab work & pertinent test results  Airway Mallampati: II   Neck ROM: full    Dental   Pulmonary sleep apnea ,    breath sounds clear to auscultation       Cardiovascular hypertension,  Rhythm:regular Rate:Normal     Neuro/Psych    GI/Hepatic   Endo/Other  diabetes, Type 2  Renal/GU stones     Musculoskeletal   Abdominal   Peds  Hematology   Anesthesia Other Findings   Reproductive/Obstetrics                             Anesthesia Physical Anesthesia Plan  ASA: II  Anesthesia Plan: General   Post-op Pain Management:    Induction: Intravenous  PONV Risk Score and Plan: 2 and Ondansetron, Dexamethasone, Midazolam and Treatment may vary due to age or medical condition  Airway Management Planned: Oral ETT  Additional Equipment:   Intra-op Plan:   Post-operative Plan: Extubation in OR  Informed Consent: I have reviewed the patients History and Physical, chart, labs and discussed the procedure including the risks, benefits and alternatives for the proposed anesthesia with the patient or authorized representative who has indicated his/her understanding and acceptance.       Plan Discussed with: CRNA, Anesthesiologist and Surgeon  Anesthesia Plan Comments:         Anesthesia Quick Evaluation

## 2019-07-28 NOTE — H&P (Signed)
James [name] is an 54 y.o. male.   Chief Complaint: Right hip pain HPI: James [name] is a 54 year old patient with right hip pain spontaneous onset approximately 2-1/2 months ago.  Describes local pain and weakness going up and down stairs where he has to use a handrail.  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  When he was evaluated in the clinic about 6 weeks ago he had significant Trendelenburg type gait.  MRI scan shows no hip arthritis but does show gluteus minimus and medius tears with minimal retraction.  He has failed a nonoperative treatment course and presents now for operative management after explanation of risks and benefits. Past Medical History:  Diagnosis Date  . Chicken pox   . Diabetes mellitus without complication (HCC)   . History of kidney stones   . Hyperlipidemia   . Hypertension   . Sleep apnea    CiPaP at night    Past Surgical History:  Procedure Laterality Date  . ANKLE ARTHROSCOPY Right 06/21/2017   Procedure: RIGHT ANKLE ARTHROSCOPY WITH DEBRIDEMENT;  Surgeon: Nadara Mustard, MD;  Location: [facility] OR;  Service: Orthopedics;  Laterality: Right;  . CYSTOSCOPY W/ URETERAL STENT PLACEMENT  08/07/2011   Procedure: CYSTOSCOPY WITH RETROGRADE PYELOGRAM/URETERAL STENT PLACEMENT;  Surgeon: Ky Barban, MD;  Location: [facility];  Service: Urology;  Laterality: Left;  Cystoscopy, Left Retrograde Pyelogram, Left Ureteral Ballon Dilation, Double J Stent Placement  . MENISCUS REPAIR     left knee 2018  . SHOULDER SURGERY     bilateral    Family History  Problem Relation Age of Onset  . Prostate cancer Father   . Diabetes Father   . Cancer Sister   . Anesthesia problems Neg Hx   . Hypotension Neg Hx   . Malignant hyperthermia Neg Hx   . Pseudochol deficiency Neg Hx   . Colon cancer Neg Hx   . Colon polyps Neg Hx   . Esophageal cancer Neg Hx   . Rectal cancer Neg Hx   . Stomach cancer Neg Hx    Social History:  reports that he has never smoked. He  has never used smokeless tobacco. He reports that he does not drink alcohol or use drugs.  Allergies:  Allergies  Allergen Reactions  . Ceftriaxone Other (See Comments)    leads to increased photosensitivity, per hospital ([facility]) 02/08/2009    Medications Prior to Admission  Medication Sig Dispense Refill  . aspirin EC 81 MG tablet Take 81 mg by mouth daily.    Marland Kitchen atorvastatin (LIPITOR) 20 MG tablet TAKE 1 TABLET BY MOUTH ONCE DAILY**MUST KEEP UPCOMING APPOINTMENT** 90 tablet 0  . Dulaglutide (TRULICITY) 0.75 MG/0.5ML SOPN Inject 0.75 mg into the skin once a week. 4 pen 1  . lisinopril-hydrochlorothiazide (ZESTORETIC) 20-12.5 MG tablet Take 2 tablets by mouth once daily 180 tablet 0  . metFORMIN (GLUCOPHAGE) 500 MG tablet Take 1 tablet (500 mg total) by mouth 2 (two) times daily with a meal. 180 tablet 0  . ONETOUCH ULTRA test strip USE  STRIP TO CHECK GLUCOSE THREE TIMES DAILY 100 each 5  . traMADol (ULTRAM) 50 MG tablet Take 1 tablet (50 mg total) by mouth at bedtime as needed. 30 tablet 0    Results for orders placed or performed during the hospital encounter of 07/28/19 (from the past 48 hour(s))  Glucose, capillary     Status: Abnormal   Collection Time: 07/28/19 10:14 AM  Result Value Ref Range  Glucose-Capillary 104 (H) 70 - 99 mg/dL   No results found.  Review of Systems  Musculoskeletal: Positive for arthralgias.  All other systems reviewed and are negative.   Blood pressure (!) 167/99, pulse 86, temperature 98.3 F (36.8 C), temperature source Tympanic, resp. rate 16, height 5\' 7"  (1.702 m), weight 124.2 kg, SpO2 100 %. Physical Exam  Constitutional: He appears well-developed.  HENT:  Head: Normocephalic.  Eyes: Pupils are equal, round, and reactive to light.  Cardiovascular: Normal rate.  Respiratory: Effort normal.  Musculoskeletal:     Cervical back: Normal range of motion.  Neurological: He is alert.  Skin: Skin is warm.  Psychiatric: He has a normal mood and  affect.  Examination of the right hip demonstrates some weakness with hip flexion.  Abduction strength slightly weaker right versus left.  No groin pain with internal/external rotation of the leg.  Pedal pulses palpable.  Assessment/Plan Impression is gluteus medius and minimus tear.  Minimal retraction based on MRI scan.  Patient has failed conservative management.  Continues to have limp and weakness.  Plan is open abductor tendon repair.  The risk and benefits are discussed including but not limited to infection nerve vessel damage failure of the repair as well as the prolonged rehabilitative process required.  Also plan to use PRP to try to augment healing.  Patient understands risk and benefits.  All questions answered.  Anderson Malta, MD 07/28/2019, 11:56 AM

## 2019-07-28 NOTE — Op Note (Signed)
NAME: James [name], James [name] MEDICAL RECORD [id number] ACCOUNT [id number] DATE OF BIRTH:[birth year] FACILITY: [facility] LOCATION: [facility] PHYSICIAN:Elexus Barman Diamantina Providence, MD  OPERATIVE REPORT  DATE OF PROCEDURE:  07/28/2019  PREOPERATIVE DIAGNOSIS:  Right hip gluteus medius and minimus tendon tear.  POSTOPERATIVE DIAGNOSIS:  Right hip gluteus medius and minimus tendon tear.  PROCEDURE:  Right hip gluteus medius and minimus tendon tear repair.  SURGEON:  Cammy Copa, MD  ASSISTANT:  Karenann Cai, PA.  INDICATIONS:  This is a patient who has got about 2-1/2 month history of hip pain and limping.  MRI scan shows tearing of the gluteus minimus and partial tearing of the gluteus medius.  He presents now for operative management after explanation of risks  and benefits.  DESCRIPTION OF PROCEDURE:  The patient was brought to the operating room where general anesthetic was induced.  Preoperative antibiotics were administered.  Timeout was called.  The patient was placed in the lateral decubitus position with the left  axilla, left peroneal nerve well padded.  Right hip was then prescrubbed with alcohol and Betadine, allowed to air dry, prepped with DuraPrep solution and draped in a sterile manner.  Collier Flowers was used to cover the operative field.  A time-out was called.   Incision was made centered over the trochanter, extending about 5 cm proximal and distal.  The skin and subcutaneous tissue were sharply divided.  Fascia lata was encountered and divided along the mid axis of the trochanter, extending proximally and  distally.  At this time, bursectomy was performed.  Sciatic nerve was identified and protected.  The Charnley retractor was placed.  The patient had tearing of the gluteus minimus tendon with minimal retraction, which extended posteriorly into the  gluteus medius.  The tendon edges were mobilized medially.  All in all, there was not much retraction, but just attachment.  The footprint of  both the anterior part of the medius attachment as well as the entire minimus attachment was prepared using a  blade to scrape the bone in order to achieve bleeding bony surface.  At this time, due to the significant width of the tendon involved, 3 Arthrex knotless suture tacks were placed which had 4 suture tapes on each implant.  These were placed equidistant  along the crescentic course of the greater trochanter.  It should be noted also at this time that six #1 Vicryl sutures were placed in the edge of the tendon in a grasping modified Mason-Allen fashion. Once the tendon footprint was prepared, the suture  tapes were passed equidistant x12 along the course of the tendon of the gluteus medius posteriorly to the gluteus minimus anteriorly.  At this time, the knots were tied x6.  Sutures were crossed.  The Vicryl sutures were then pulled into 2 [place]  placed not at the same level in the trochanteric region.  The double row repair was brought down in a watertight fashion.  Platelet-rich plasma was injected into the footprint space both before and after final tie-down down of the tendon.  All in all, a  stable construct was achieved.  Thorough irrigation was performed at this time.  The fascia lata was then closed using interrupted inverted #1 Vicryl suture, followed by interrupted inverted 0 Vicryl suture, 2-0 Vicryl suture and a 3-0 Monocryl.   Steri-Strips and Aquacel dressing applied.  The patient was then transferred to the recovery room, nonweightbearing in an abduction splint.  He will follow up with me in 1 week.  [name]'s assistance was required  for retraction as well as mobilization of  tissue and overall limb positioning and alignment.  His assistance was a medical necessity.  VN/NUANCE  D:07/28/2019 T:07/28/2019 JOB:[id number]

## 2019-07-28 NOTE — Transfer of Care (Signed)
Immediate Anesthesia Transfer of Care Note  [name]  Procedure(s) Performed: RIGHT HIP TENDON TEAR REPAIR (Right Hip)  Patient Location: PACU  Anesthesia Type:General  Level of Consciousness: drowsy and patient cooperative  Airway & Oxygen Therapy: Patient Spontanous Breathing and Patient connected to face mask oxygen  Post-op Assessment: Report given to RN and Post -op Vital signs reviewed and stable  Post vital signs: Reviewed and stable  Last Vitals:  Vitals Value Taken Time  BP 138/93 07/28/19 1515  Temp    Pulse 85 07/28/19 1517  Resp 23 07/28/19 1517  SpO2 100 % 07/28/19 1517  Vitals shown include unvalidated device data.  Last Pain:  Vitals:   07/28/19 1005  TempSrc: Tympanic  PainSc: 0-No pain         Complications: No apparent anesthesia complications

## 2019-07-28 NOTE — Discharge Instructions (Signed)

## 2019-07-29 ENCOUNTER — Encounter: Payer: Self-pay | Admitting: Orthopedic Surgery

## 2019-07-29 ENCOUNTER — Encounter: Payer: Self-pay | Admitting: *Deleted

## 2019-07-29 NOTE — Anesthesia Postprocedure Evaluation (Signed)
Anesthesia Post Note  [name]  Procedure(s) Performed: RIGHT HIP TENDON TEAR REPAIR (Right Hip)     Patient location during evaluation: PACU Anesthesia Type: General Level of consciousness: awake and alert Pain management: pain level controlled Vital Signs Assessment: post-procedure vital signs reviewed and stable Respiratory status: spontaneous breathing, nonlabored ventilation, respiratory function stable and patient connected to nasal cannula oxygen Cardiovascular status: blood pressure returned to baseline and stable Postop Assessment: no apparent nausea or vomiting Anesthetic complications: no    Last Vitals:  Vitals:   07/28/19 1600 07/28/19 1615  BP: (!) 146/81 132/79  Pulse: 86 82  Resp: 17 15  Temp:  37 C  SpO2: 97% 96%    Last Pain:  Vitals:   07/28/19 1615  TempSrc:   PainSc: 0-No pain                 Suraj Ramdass S

## 2019-07-30 ENCOUNTER — Encounter: Payer: Self-pay | Admitting: *Deleted

## 2019-08-04 ENCOUNTER — Other Ambulatory Visit: Payer: Self-pay

## 2019-08-04 ENCOUNTER — Ambulatory Visit (INDEPENDENT_AMBULATORY_CARE_PROVIDER_SITE_OTHER): Payer: BC Managed Care – PPO | Admitting: Orthopedic Surgery

## 2019-08-04 ENCOUNTER — Encounter: Payer: Self-pay | Admitting: Orthopedic Surgery

## 2019-08-04 DIAGNOSIS — S76011D Strain of muscle, fascia and tendon of right hip, subsequent encounter: Secondary | ICD-10-CM

## 2019-08-07 ENCOUNTER — Encounter: Payer: Self-pay | Admitting: Orthopedic Surgery

## 2019-08-07 NOTE — Progress Notes (Signed)
Post-Op Visit Note   [name]           Date of Birth: 08-21-1965           MRN: [id number] Visit Date: 08/04/2019 PCP: Olive Bass, FNP   Assessment & Plan:  Chief Complaint:  Chief Complaint  Patient presents with  . Post-op Follow-up   Visit Diagnoses:  1. Tear of right gluteus minimus tendon, subsequent encounter     Plan: Patient is a 54 year old male who presents s/p right hip gluteus minimus tendon repair on 07/28/2019.  Patient notes that he has moderate pain and that the first 3 days following surgery were the worst.  He has improved somewhat since then.  He is taking the oxycodone but states that he is going to stop tomorrow as it does not make him feel well.  He rates his pain out of 4 out of 10.  He is using crutches instead of the knee scooter as the knee scooter gives him more pain at the surgical site.  He has been using the abduction pillow when sleeping in bed.  He is compliant with nonweightbearing status and with taking aspirin.  Denies any shortness of breath.  On exam his incision is healing well.  He has no calf tenderness and a negative Homans' sign.  Plan to continue current course.  We will likely start the knee scooter in 3 weeks.  Patient will follow up with the office in 2 weeks for clinical recheck.  Follow-Up Instructions: No follow-ups on file.   Orders:  No orders of the defined types were placed in this encounter.  No orders of the defined types were placed in this encounter.   Imaging: No results found.  PMFS History: Patient Active Problem List   Diagnosis Date Noted  . Type 2 diabetes mellitus without complication (HCC) 10/03/2018  . Osteochondritis dissecans of right talus   . Essential hypertension, benign 06/08/2016  . Routine general medical examination at a health care facility 04/12/2016  . Morbid obesity (HCC) 04/12/2016  . Rash and nonspecific skin eruption 03/23/2015  . Obstructive apnea 04/21/2014    Past Medical History:  Diagnosis Date  . Chicken pox   . Diabetes mellitus without complication (HCC)   . History of kidney stones   . Hyperlipidemia   . Hypertension   . Sleep apnea    CiPaP at night    Family History  Problem Relation Age of Onset  . Prostate cancer Father   . Diabetes Father   . Cancer Sister   . Anesthesia problems Neg Hx   . Hypotension Neg Hx   . Malignant hyperthermia Neg Hx   . Pseudochol deficiency Neg Hx   . Colon cancer Neg Hx   . Colon polyps Neg Hx   . Esophageal cancer Neg Hx   . Rectal cancer Neg Hx   . Stomach cancer Neg Hx     Past Surgical History:  Procedure Laterality Date  . ANKLE ARTHROSCOPY Right 06/21/2017   Procedure: RIGHT ANKLE ARTHROSCOPY WITH DEBRIDEMENT;  Surgeon: Nadara Mustard, MD;  Location: [facility] OR;  Service: Orthopedics;  Laterality: Right;  . CYSTOSCOPY W/ URETERAL STENT PLACEMENT  08/07/2011   Procedure: CYSTOSCOPY WITH RETROGRADE PYELOGRAM/URETERAL STENT PLACEMENT;  Surgeon: Ky Barban, MD;  Location: [facility];  Service: Urology;  Laterality: Left;  Cystoscopy, Left Retrograde Pyelogram, Left Ureteral Ballon Dilation, Double J Stent Placement  . GLUTEUS MINIMUS REPAIR Right 07/28/2019   Procedure: RIGHT  HIP TENDON TEAR REPAIR;  Surgeon: Meredith Pel, MD;  Location: [place];  Service: Orthopedics;  Laterality: Right;  . MENISCUS REPAIR     left knee 2018  . SHOULDER SURGERY     bilateral   Social History   Occupational History  . Occupation: Therapist, occupational  Tobacco Use  . Smoking status: Never Smoker  . Smokeless tobacco: Never Used  Substance and Sexual Activity  . Alcohol use: No  . Drug use: No  . Sexual activity: Not on file

## 2019-08-08 ENCOUNTER — Encounter: Payer: Self-pay | Admitting: Orthopedic Surgery

## 2019-08-12 ENCOUNTER — Encounter: Payer: Self-pay | Admitting: Orthopedic Surgery

## 2019-08-12 NOTE — Telephone Encounter (Signed)
At this point should be ok

## 2019-08-13 NOTE — Telephone Encounter (Signed)
Ok to sleep as needed position as needed

## 2019-08-17 ENCOUNTER — Encounter: Payer: Self-pay | Admitting: Orthopedic Surgery

## 2019-08-17 NOTE — Telephone Encounter (Signed)
I cannot say for sure and ultimately it is up to him but I would recommend 4 weeks postop to be on the safe side thanks

## 2019-08-19 ENCOUNTER — Ambulatory Visit (INDEPENDENT_AMBULATORY_CARE_PROVIDER_SITE_OTHER): Payer: BC Managed Care – PPO | Admitting: Orthopedic Surgery

## 2019-08-19 ENCOUNTER — Other Ambulatory Visit: Payer: Self-pay

## 2019-08-19 DIAGNOSIS — S76011D Strain of muscle, fascia and tendon of right hip, subsequent encounter: Secondary | ICD-10-CM

## 2019-08-21 ENCOUNTER — Encounter: Payer: Self-pay | Admitting: Orthopedic Surgery

## 2019-08-21 NOTE — Progress Notes (Signed)
Post-Op Visit Note   [name]           Date of Birth: [birth year]           MRN: [id number] Visit Date: 08/19/2019 PCP: Marrian Salvage, FNP   Assessment & Plan:  Chief Complaint:  Chief Complaint  Patient presents with  . Follow-up   Visit Diagnoses:  1. Tear of right gluteus minimus tendon, subsequent encounter     Plan: Patient is a 54 year old male who presents s/p right hip gluteal tendon repair on 07/28/2019.  Patient notes that he is doing well and continues to improve steadily.  He is ambulating partial weightbearing with crutches.  He is not taking any medication for pain as he states that he does not need it.  He is sleeping better and is sleeping on his back, stomach, left side.  He is using vitamin E oil on the incision and the incisions healing up well without any evidence of dehiscence or infection.  He does note some burning sensation lateral to the incision but I assured him that this is normal and should continue to improve with time out from surgery.  Patient should be okay with driving at this point.  He wants to start using stationary bike which should be fine 1 week from today.  Patient agreed with the plan.  He will follow-up in 3 weeks.  Continue with crutches until the next appointment.  Follow-Up Instructions: No follow-ups on file.   Orders:  No orders of the defined types were placed in this encounter.  No orders of the defined types were placed in this encounter.   Imaging: No results found.  PMFS History: Patient Active Problem List   Diagnosis Date Noted  . Type 2 diabetes mellitus without complication ([place]) 38/04/1750  . Osteochondritis dissecans of right talus   . Essential hypertension, benign 06/08/2016  . Routine general medical examination at a health care facility 04/12/2016  . Morbid obesity ([place]) 04/12/2016  . Rash and nonspecific skin eruption 03/23/2015  . Obstructive apnea 04/21/2014   Past Medical History:   Diagnosis Date  . Chicken pox   . Diabetes mellitus without complication ([place])   . History of kidney stones   . Hyperlipidemia   . Hypertension   . Sleep apnea    CiPaP at night    Family History  Problem Relation Age of Onset  . Prostate cancer Father   . Diabetes Father   . Cancer Sister   . Anesthesia problems Neg Hx   . Hypotension Neg Hx   . Malignant hyperthermia Neg Hx   . Pseudochol deficiency Neg Hx   . Colon cancer Neg Hx   . Colon polyps Neg Hx   . Esophageal cancer Neg Hx   . Rectal cancer Neg Hx   . Stomach cancer Neg Hx     Past Surgical History:  Procedure Laterality Date  . ANKLE ARTHROSCOPY Right 06/21/2017   Procedure: RIGHT ANKLE ARTHROSCOPY WITH DEBRIDEMENT;  Surgeon: Newt Minion, MD;  Location: [place];  Service: Orthopedics;  Laterality: Right;  . CYSTOSCOPY W/ URETERAL STENT PLACEMENT  08/07/2011   Procedure: CYSTOSCOPY WITH RETROGRADE PYELOGRAM/URETERAL STENT PLACEMENT;  Surgeon: Marissa Nestle, MD;  Location: [facility];  Service: Urology;  Laterality: Left;  Cystoscopy, Left Retrograde Pyelogram, Left Ureteral Ballon Dilation, Double J Stent Placement  . GLUTEUS MINIMUS REPAIR Right 07/28/2019   Procedure: RIGHT HIP TENDON TEAR REPAIR;  Surgeon: Meredith Pel, MD;  Location: [facility];  Service: Orthopedics;  Laterality: Right;  . MENISCUS REPAIR     left knee 2018  . SHOULDER SURGERY     bilateral   Social History   Occupational History  . Occupation: Data processing manager  Tobacco Use  . Smoking status: Never Smoker  . Smokeless tobacco: Never Used  Substance and Sexual Activity  . Alcohol use: No  . Drug use: No  . Sexual activity: Not on file

## 2019-09-09 ENCOUNTER — Other Ambulatory Visit: Payer: Self-pay

## 2019-09-09 ENCOUNTER — Ambulatory Visit (INDEPENDENT_AMBULATORY_CARE_PROVIDER_SITE_OTHER): Payer: BC Managed Care – PPO | Admitting: Orthopedic Surgery

## 2019-09-09 ENCOUNTER — Encounter: Payer: Self-pay | Admitting: Orthopedic Surgery

## 2019-09-09 DIAGNOSIS — S76011D Strain of muscle, fascia and tendon of right hip, subsequent encounter: Secondary | ICD-10-CM

## 2019-09-09 NOTE — Progress Notes (Signed)
Post-Op Visit Note   [name]           Date of Birth: [birth year]           MRN: [id number] Visit Date: 09/09/2019 PCP: Olive Bass, FNP   Assessment & Plan:  Chief Complaint:  Chief Complaint  Patient presents with  . Follow-up   Visit Diagnoses: No diagnosis found.  Plan: [place] is a patient who is now 6 weeks out right hip abductor tendon repair.  Having some soreness but overall his gait is slowly improving.  On exam I do feel any popping when he is walking.  Still has slight Trendelenburg gait.  He is doing the bike.  I want him to start working on physical therapy and gait training with strengthening not to begin earlier than 2 weeks from now.  Copy op note provided.  6-week return for clinical recheck.  Do not anticipate return to work before that time.  Follow-Up Instructions: Return in about 6 weeks (around 10/21/2019).   Orders:  No orders of the defined types were placed in this encounter.  No orders of the defined types were placed in this encounter.   Imaging: No results found.  PMFS History: Patient Active Problem List   Diagnosis Date Noted  . Type 2 diabetes mellitus without complication (HCC) 10/03/2018  . Osteochondritis dissecans of right talus   . Essential hypertension, benign 06/08/2016  . Routine general medical examination at a health care facility 04/12/2016  . Morbid obesity (HCC) 04/12/2016  . Rash and nonspecific skin eruption 03/23/2015  . Obstructive apnea 04/21/2014   Past Medical History:  Diagnosis Date  . Chicken pox   . Diabetes mellitus without complication (HCC)   . History of kidney stones   . Hyperlipidemia   . Hypertension   . Sleep apnea    CiPaP at night    Family History  Problem Relation Age of Onset  . Prostate cancer Father   . Diabetes Father   . Cancer Sister   . Anesthesia problems Neg Hx   . Hypotension Neg Hx   . Malignant hyperthermia Neg Hx   . Pseudochol deficiency Neg Hx   .  Colon cancer Neg Hx   . Colon polyps Neg Hx   . Esophageal cancer Neg Hx   . Rectal cancer Neg Hx   . Stomach cancer Neg Hx     Past Surgical History:  Procedure Laterality Date  . ANKLE ARTHROSCOPY Right 06/21/2017   Procedure: RIGHT ANKLE ARTHROSCOPY WITH DEBRIDEMENT;  Surgeon: Nadara Mustard, MD;  Location: [facility] OR;  Service: Orthopedics;  Laterality: Right;  . CYSTOSCOPY W/ URETERAL STENT PLACEMENT  08/07/2011   Procedure: CYSTOSCOPY WITH RETROGRADE PYELOGRAM/URETERAL STENT PLACEMENT;  Surgeon: Ky Barban, MD;  Location: [facility];  Service: Urology;  Laterality: Left;  Cystoscopy, Left Retrograde Pyelogram, Left Ureteral Ballon Dilation, Double J Stent Placement  . GLUTEUS MINIMUS REPAIR Right 07/28/2019   Procedure: RIGHT HIP TENDON TEAR REPAIR;  Surgeon: Cammy Copa, MD;  Location: [facility];  Service: Orthopedics;  Laterality: Right;  . MENISCUS REPAIR     left knee 2018  . SHOULDER SURGERY     bilateral   Social History   Occupational History  . Occupation: Data processing manager  Tobacco Use  . Smoking status: Never Smoker  . Smokeless tobacco: Never Used  Substance and Sexual Activity  . Alcohol use: No  . Drug use: No  . Sexual activity: Not  on file

## 2019-09-10 ENCOUNTER — Telehealth: Payer: Self-pay | Admitting: Orthopedic Surgery

## 2019-09-10 NOTE — Telephone Encounter (Signed)
Last ov note faxed to [facility], has appt 3/15 for 1st P.T. appt [phone]

## 2019-09-30 NOTE — Telephone Encounter (Signed)
discard

## 2019-10-01 ENCOUNTER — Encounter: Payer: Self-pay | Admitting: Orthopedic Surgery

## 2019-10-01 NOTE — Telephone Encounter (Signed)
Mid may

## 2019-10-06 ENCOUNTER — Other Ambulatory Visit: Payer: Self-pay | Admitting: Family

## 2019-10-06 MED ORDER — LISINOPRIL-HYDROCHLOROTHIAZIDE 20-12.5 MG PO TABS: 2.0000 | ORAL_TABLET | Freq: Every day | ORAL | 0 refills | Status: DC

## 2019-10-06 MED ORDER — TRULICITY 0.75 MG/0.5ML ~~LOC~~ SOAJ: 0.7500 mg | SUBCUTANEOUS | 1 refills | Status: DC

## 2019-10-06 MED ORDER — METFORMIN HCL 500 MG PO TABS: 500.0000 mg | ORAL_TABLET | Freq: Two times a day (BID) | ORAL | 0 refills | Status: DC

## 2019-10-06 NOTE — Telephone Encounter (Signed)
He was due for an OV in February- we discussed this at his appointment in January. We need him to make a follow-up for diabetes check up. These will be the last refills without OV.

## 2019-10-21 ENCOUNTER — Encounter: Payer: Self-pay | Admitting: Orthopedic Surgery

## 2019-10-21 ENCOUNTER — Other Ambulatory Visit: Payer: Self-pay

## 2019-10-21 ENCOUNTER — Ambulatory Visit (INDEPENDENT_AMBULATORY_CARE_PROVIDER_SITE_OTHER): Payer: BC Managed Care – PPO | Admitting: Orthopedic Surgery

## 2019-10-21 DIAGNOSIS — S76011D Strain of muscle, fascia and tendon of right hip, subsequent encounter: Secondary | ICD-10-CM

## 2019-10-21 NOTE — Progress Notes (Signed)
Post-Op Visit Note   [name]           Date of Birth: 1965-10-28           MRN: [id number] Visit Date: 10/21/2019 PCP: Marrian Salvage, FNP   Assessment & Plan:  Chief Complaint:  Chief Complaint  Patient presents with  . Right Hip - Follow-up   Visit Diagnoses:  1. Tear of right gluteus minimus tendon, subsequent encounter     Plan: James [name] is a patient who is now about 3 months out right hip gluteus medius and minimus tendon repair.  Gradually making progress.  He is able to walk up the stairs.  In general the hip feels more stable.  He did finish physical therapy and has progressed to home exercise program.  Plans to return to work on 517.  Biggest concern now is getting off ladder and putting his full weight on that single leg on the right-hand side.  On exam his gait is improved but he still has a slight Trendelenburg gait.  He is able to move laterally which she was not able to do before.  Hip flexion abduction adduction strength intact.  No groin pain with internal extra rotation of the leg.  Plan at this time is to have him continue with his home exercise program.  Tentatively okay to go back to work 517 but he may need another week or 2 if his hip is not quite ready for full activity.  I will see him back in 8 weeks for final check.  Follow-Up Instructions: Return in about 8 weeks (around 12/16/2019).   Orders:  No orders of the defined types were placed in this encounter.  No orders of the defined types were placed in this encounter.   Imaging: No results found.  PMFS History: Patient Active Problem List   Diagnosis Date Noted  . Type 2 diabetes mellitus without complication ([place]) 37/90/2409  . Osteochondritis dissecans of right talus   . Essential hypertension, benign 06/08/2016  . Routine general medical examination at a health care facility 04/12/2016  . Morbid obesity ([place]) 04/12/2016  . Rash and nonspecific skin eruption 03/23/2015  .  Obstructive apnea 04/21/2014   Past Medical History:  Diagnosis Date  . Chicken pox   . Diabetes mellitus without complication ([place])   . History of kidney stones   . Hyperlipidemia   . Hypertension   . Sleep apnea    CiPaP at night    Family History  Problem Relation Age of Onset  . Prostate cancer Father   . Diabetes Father   . Cancer Sister   . Anesthesia problems Neg Hx   . Hypotension Neg Hx   . Malignant hyperthermia Neg Hx   . Pseudochol deficiency Neg Hx   . Colon cancer Neg Hx   . Colon polyps Neg Hx   . Esophageal cancer Neg Hx   . Rectal cancer Neg Hx   . Stomach cancer Neg Hx     Past Surgical History:  Procedure Laterality Date  . ANKLE ARTHROSCOPY Right 06/21/2017   Procedure: RIGHT ANKLE ARTHROSCOPY WITH DEBRIDEMENT;  Surgeon: Newt Minion, MD;  Location: [facility];  Service: Orthopedics;  Laterality: Right;  . CYSTOSCOPY W/ URETERAL STENT PLACEMENT  08/07/2011   Procedure: CYSTOSCOPY WITH RETROGRADE PYELOGRAM/URETERAL STENT PLACEMENT;  Surgeon: Marissa Nestle, MD;  Location: [facility];  Service: Urology;  Laterality: Left;  Cystoscopy, Left Retrograde Pyelogram, Left Ureteral Ballon Dilation, Double J Stent  Placement  . GLUTEUS MINIMUS REPAIR Right 07/28/2019   Procedure: RIGHT HIP TENDON TEAR REPAIR;  Surgeon: Cammy Copa, MD;  Location: [facility];  Service: Orthopedics;  Laterality: Right;  . MENISCUS REPAIR     left knee 2018  . SHOULDER SURGERY     bilateral   Social History   Occupational History  . Occupation: Data processing manager  Tobacco Use  . Smoking status: Never Smoker  . Smokeless tobacco: Never Used  Substance and Sexual Activity  . Alcohol use: No  . Drug use: No  . Sexual activity: Not on file

## 2019-11-16 ENCOUNTER — Encounter: Payer: Self-pay | Admitting: Orthopedic Surgery

## 2019-11-16 ENCOUNTER — Telehealth: Payer: Self-pay | Admitting: Orthopedic Surgery

## 2019-11-16 NOTE — Telephone Encounter (Signed)
Patient called. He would like a return to work note faxed to him. Fax number is [phone]. His call back number is [phone]

## 2019-11-16 NOTE — Telephone Encounter (Signed)
faxed

## 2019-11-17 ENCOUNTER — Telehealth: Payer: Self-pay | Admitting: Orthopedic Surgery

## 2019-11-17 NOTE — Telephone Encounter (Signed)
Faxed and emailed.

## 2019-11-17 NOTE — Telephone Encounter (Signed)
Patient called. He did not receive the work note. The fax number is [phone].

## 2019-12-16 ENCOUNTER — Ambulatory Visit: Payer: BC Managed Care – PPO | Admitting: Orthopedic Surgery

## 2019-12-28 ENCOUNTER — Ambulatory Visit (INDEPENDENT_AMBULATORY_CARE_PROVIDER_SITE_OTHER): Payer: BC Managed Care – PPO | Admitting: Orthopedic Surgery

## 2019-12-28 ENCOUNTER — Ambulatory Visit: Payer: Self-pay

## 2019-12-28 ENCOUNTER — Telehealth: Payer: Self-pay

## 2019-12-28 DIAGNOSIS — M170 Bilateral primary osteoarthritis of knee: Secondary | ICD-10-CM | POA: Diagnosis not present

## 2019-12-28 DIAGNOSIS — G8929 Other chronic pain: Secondary | ICD-10-CM

## 2019-12-28 DIAGNOSIS — M25562 Pain in left knee: Secondary | ICD-10-CM | POA: Diagnosis not present

## 2019-12-28 DIAGNOSIS — M25561 Pain in right knee: Secondary | ICD-10-CM

## 2019-12-28 NOTE — Telephone Encounter (Signed)
Can you please get patient approved for bilat knee gel injections? Thanks

## 2019-12-29 NOTE — Telephone Encounter (Signed)
Noted  

## 2020-01-04 ENCOUNTER — Encounter: Payer: Self-pay | Admitting: Orthopedic Surgery

## 2020-01-04 NOTE — Progress Notes (Signed)
Office Visit Note   James [name]: James [name]           Date of Birth: 08/02/1965           MRN: [id number] Visit Date: 12/28/2019 Requested by: Olive Bass, FNP [address] [place],  [place] [address] PCP: Olive Bass, FNP  Subjective: Chief Complaint  James [name] presents with  . Right Hip - Follow-up    HPI: James [name] is a 54 y.o. male who presents to the office complaining of bilateral knee pain.  James [name] notes pain over the last 3 months.  He localizes pain to the medial lateral aspects of bilateral knees.  Left knee bothers him more than his right.  He denies any mechanical symptoms.  He has difficulty with stairs and kneeling due to pain and states that he has stiffness when he begins walking.  His left knee has had increased swelling.  No recent injury.  No history of gout.  He does not take any medications for symptoms.  He has tried Tylenol without any help and tried Aleve without any relief.  He has no history of right knee surgery but he has had left knee meniscectomy.  Left knee MRI in 2019 showed attenuation of the medial meniscus with cartilage loss of the medial compartment and a chondral defect of the trochlear groove.  He does have a history of diabetes and his last A1c was 5.9..    James [name] notes that his right hip is doing well following the gluteal tendon repair in January of this year.              ROS:  All systems reviewed are negative as they relate to the chief complaint within the history of present illness.  James [name] denies fevers or chills.  Assessment & Plan: Visit Diagnoses:  1. Chronic pain of left knee   2. Chronic pain of right knee   3. Primary osteoarthritis of knees, bilateral     Plan: James [name] is a 54 year old male presents complaint of 3 months of bilateral knee pain.  Left knee bothering him more than his right knee.  He has a history of left knee meniscectomy with a left knee MRI in 2019 revealing attenuation of the  medial meniscus and cartilage loss of the medial compartment and a chondral defect of the trochlear groove.  Radiographs taken today reveal moderate osteoarthritis of the left knee and mild osteoarthritis of the right knee.  Degenerative changes are worst in the medial compartment of the left knee.  No mechanical symptoms.  No instability.  Discussed options available to James [name].  He wishes to proceed with bilateral knee cortisone injections.  He tolerated the procedure well.  Follow-up as needed.  We will preapproved James [name] for gel injections.  This James [name] is diagnosed with osteoarthritis of the knee(s).    Radiographs show evidence of joint space narrowing, osteophytes, subchondral sclerosis and/or subchondral cysts.  This James [name] has knee pain which interferes with functional and activities of daily living.    This James [name] has experienced inadequate response, adverse effects and/or intolerance with conservative treatments such as acetaminophen, NSAIDS, topical creams, physical therapy or regular exercise, knee bracing and/or weight loss.   This James [name] has experienced inadequate response or has a contraindication to intra articular steroid injections for at least 3 months.   This James [name] is not scheduled to have a total knee replacement within 6 months of starting treatment with viscosupplementation.   Follow-Up Instructions: No follow-ups on  file.   Orders:  Orders Placed This Encounter  Procedures  . XR Knee 1-2 Views Right  . XR KNEE 3 VIEW LEFT   No orders of the defined types were placed in this encounter.     Procedures: Large Joint Inj on 01/05/2020 3:43 PM Indications: diagnostic evaluation, joint swelling and pain Details: 18 G 1.5 in needle, superolateral approach  Arthrogram: No  Medications: 5 mL lidocaine 1 %; 40 mg methylPREDNISolone acetate 40 MG/ML; 4 mL bupivacaine 0.25 % Outcome: tolerated well, no immediate complications Procedure, treatment alternatives, risks  and benefits explained, specific risks discussed. Consent was given by the James [name]. Immediately prior to procedure a time out was called to verify the correct James [name], procedure, equipment, support staff and site/side marked as required. James [name] was prepped and draped in the usual sterile fashion.       Clinical Data: No additional findings.  Objective: Vital Signs: There were no vitals taken for this visit.  Physical Exam:  Constitutional: James [name] appears well-developed HEENT:  Head: Normocephalic Eyes:EOM are normal Neck: Normal range of motion Cardiovascular: Normal rate Pulmonary/chest: Effort normal Neurologic: James [name] is alert Skin: Skin is warm Psychiatric: James [name] has normal mood and affect  Ortho Exam:  Right knee Exam No effusion Positive patellar grind test.  Mild tenderness palpation of the medial joint line. Extensor mechanism intact No TTP over the lateral jointlines, quad tendon, patellar tendon, pes anserinus, patella, tibial tubercle, LCL/MCL insertions Stable to varus/valgus stresses.  Stable to anterior/posterior drawer Extension to 0 degrees Flexion > 90 degrees  Left knee Exam Positive effusion Positive patellar grind test Tender to palpation over the medial lateral joint lines. Extensor mechanism intact No TTP over the quad tendon, patellar tendon, pes anserinus, patella, tibial tubercle, LCL/MCL insertions Stable to varus/valgus stresses.  Stable to anterior/posterior drawer Extension to 0 degrees Flexion > 90 degrees  Specialty Comments:  No specialty comments available.  Imaging: No results found.   PMFS History: James [name] Active Problem List   Diagnosis Date Noted  . Type 2 diabetes mellitus without complication (HCC) 10/03/2018  . Osteochondritis dissecans of right talus   . Essential hypertension, benign 06/08/2016  . Routine general medical examination at a health care facility 04/12/2016  . Morbid obesity (HCC) 04/12/2016  . Rash  and nonspecific skin eruption 03/23/2015  . Obstructive apnea 04/21/2014   Past Medical History:  Diagnosis Date  . Chicken pox   . Diabetes mellitus without complication (HCC)   . History of kidney stones   . Hyperlipidemia   . Hypertension   . Sleep apnea    CiPaP at night    Family History  Problem Relation Age of Onset  . Prostate cancer Father   . Diabetes Father   . Cancer Sister   . Anesthesia problems Neg Hx   . Hypotension Neg Hx   . Malignant hyperthermia Neg Hx   . Pseudochol deficiency Neg Hx   . Colon cancer Neg Hx   . Colon polyps Neg Hx   . Esophageal cancer Neg Hx   . Rectal cancer Neg Hx   . Stomach cancer Neg Hx     Past Surgical History:  Procedure Laterality Date  . ANKLE ARTHROSCOPY Right 06/21/2017   Procedure: RIGHT ANKLE ARTHROSCOPY WITH DEBRIDEMENT;  Surgeon: Nadara Mustard, MD;  Location: [facility] OR;  Service: Orthopedics;  Laterality: Right;  . CYSTOSCOPY W/ URETERAL STENT PLACEMENT  08/07/2011   Procedure: CYSTOSCOPY WITH RETROGRADE PYELOGRAM/URETERAL STENT PLACEMENT;  Surgeon: Ky Barban, MD;  Location: [facility];  Service: Urology;  Laterality: Left;  Cystoscopy, Left Retrograde Pyelogram, Left Ureteral Ballon Dilation, Double J Stent Placement  . GLUTEUS MINIMUS REPAIR Right 07/28/2019   Procedure: RIGHT HIP TENDON TEAR REPAIR;  Surgeon: Cammy Copa, MD;  Location: [facility];  Service: Orthopedics;  Laterality: Right;  . MENISCUS REPAIR     left knee 2018  . SHOULDER SURGERY     bilateral   Social History   Occupational History  . Occupation: Data processing manager  Tobacco Use  . Smoking status: Never Smoker  . Smokeless tobacco: Never Used  Vaping Use  . Vaping Use: Never used  Substance and Sexual Activity  . Alcohol use: No  . Drug use: No  . Sexual activity: Not on file

## 2020-01-05 DIAGNOSIS — M170 Bilateral primary osteoarthritis of knee: Secondary | ICD-10-CM | POA: Diagnosis not present

## 2020-01-05 MED ORDER — LIDOCAINE HCL 1 % IJ SOLN
5.0000 mL | INTRAMUSCULAR | Status: AC | PRN
  Administered 2020-01-05: 5 mL

## 2020-01-05 MED ORDER — METHYLPREDNISOLONE ACETATE 40 MG/ML IJ SUSP
40.0000 mg | INTRAMUSCULAR | Status: AC | PRN
  Administered 2020-01-05: 40 mg via INTRA_ARTICULAR

## 2020-01-05 MED ORDER — BUPIVACAINE HCL 0.25 % IJ SOLN
4.0000 mL | INTRAMUSCULAR | Status: AC | PRN
  Administered 2020-01-05: 4 mL via INTRA_ARTICULAR

## 2020-01-06 ENCOUNTER — Telehealth: Payer: Self-pay

## 2020-01-06 NOTE — Telephone Encounter (Signed)
Submitted VOB, SynviscOne, bilateral knee. 

## 2020-01-11 ENCOUNTER — Telehealth: Payer: Self-pay

## 2020-01-11 NOTE — Telephone Encounter (Signed)
PA required for SynviscOne, bilateral knee. Faxed completed PA form to [employer] at [phone]. 

## 2020-01-19 ENCOUNTER — Telehealth: Payer: Self-pay

## 2020-01-19 NOTE — Telephone Encounter (Signed)
Approved, SynviscOne, bilateral knee. Buy & Bill Covered at 100% of the allowed amount. No Co-pay PA required PA Approval# [id number] Valid 01/11/2020- 07/13/2020

## 2020-02-08 ENCOUNTER — Encounter: Payer: Self-pay | Admitting: Family

## 2020-02-08 ENCOUNTER — Ambulatory Visit: Payer: BC Managed Care – PPO | Admitting: Orthopedic Surgery

## 2020-03-02 ENCOUNTER — Encounter: Payer: Self-pay | Admitting: Orthopedic Surgery

## 2020-03-02 NOTE — Telephone Encounter (Signed)
Ok for more pt then reeval with knee injections thx  2 x week 4 w thx

## 2020-05-19 ENCOUNTER — Ambulatory Visit: Payer: BC Managed Care – PPO | Admitting: Orthopedic Surgery

## 2020-05-25 ENCOUNTER — Ambulatory Visit (INDEPENDENT_AMBULATORY_CARE_PROVIDER_SITE_OTHER): Payer: BC Managed Care – PPO | Admitting: Orthopedic Surgery

## 2020-05-25 DIAGNOSIS — M25551 Pain in right hip: Secondary | ICD-10-CM | POA: Diagnosis not present

## 2020-05-28 ENCOUNTER — Encounter: Payer: Self-pay | Admitting: Orthopedic Surgery

## 2020-05-28 NOTE — Progress Notes (Signed)
Office Visit Note   [name]           Date of Birth: August 05, 1965           MRN: [id number] Visit Date: 05/25/2020 Requested by: Olive Bass, FNP [address] [place],  [place] [address] PCP: Olive Bass, FNP  Subjective: Chief Complaint  Patient presents with  . Right Hip - Pain    HPI: James [name] is a 54 y.o. male who presents to the office complaining of right hip pain.  Patient is s/p right hip tendon repair on 07/28/2019.  He initially was doing well after his recovery from surgery but over the last 2 to 3 months he feels pain that he states feels similar comparatively to preop pain.  He states that he has pain with every step in his right hip.  He is limping again as he was prior to surgery.  He denies any good days stating "every day is a bad day".  He is unable to sleep on his right side due to the hip pain.  Denies any radicular pain down his leg.  He does note occasional low back pain but no significant recent increase in his low back pain and states his pain "does not feel like sciatica".  Denies any fevers or chills.  No medications he has tried has helped with his pain.  He is also lost 40 pounds..                ROS: All systems reviewed are negative as they relate to the chief complaint within the history of present illness.  Patient denies fevers or chills.  Assessment & Plan: Visit Diagnoses:  1. Pain in right hip     Plan: Patient is a 54 year old male who presents s/p right hip tendon repair on 07/28/2019.  Over the last about 3 months he has developed similar symptoms to how his hip felt before surgery.  He has a Trendelenburg gait and constant pain that is affecting him with every single step.  He is unable to sleep on his right side.  Denies any groin pain or significant low back pain with radicular pain.  He does have tenderness over the right greater trochanter.  Concern for retear of the right gluteal tendon repair.  If  this indeed is the case then surgical options may be limited.  We may need to do some type of interpositional graft which may or may not be effective.  Ordered MRI of the pelvis to evaluate right gluteal tendon retear.  Follow-up after MRI to review results.  Follow-Up Instructions: No follow-ups on file.   Orders:  Orders Placed This Encounter  Procedures  . MR Pelvis w/o contrast   No orders of the defined types were placed in this encounter.     Procedures: No procedures performed   Clinical Data: No additional findings.  Objective: Vital Signs: There were no vitals taken for this visit.  Physical Exam:  Constitutional: Patient appears well-developed HEENT:  Head: Normocephalic Eyes:EOM are normal Neck: Normal range of motion Cardiovascular: Normal rate Pulmonary/chest: Effort normal Neurologic: Patient is alert Skin: Skin is warm Psychiatric: Patient has normal mood and affect  Ortho Exam: Ortho exam demonstrates right hip with well-healed incisional scar from prior surgery.  Tenderness over the right greater trochanter with no such tenderness on the left side.  Unable to stabilize his pelvis when balancing on the right leg.  Negative straight leg raise.  +  5 motor strength of bilateral hip flexion and hip abduction.  Positive Trendelenburg gait.   Specialty Comments:  No specialty comments available.  Imaging: No results found.   PMFS History: Patient Active Problem List   Diagnosis Date Noted  . Type 2 diabetes mellitus without complication (HCC) 10/03/2018  . Osteochondritis dissecans of right talus   . Essential hypertension, benign 06/08/2016  . Routine general medical examination at a health care facility 04/12/2016  . Morbid obesity (HCC) 04/12/2016  . Rash and nonspecific skin eruption 03/23/2015  . Obstructive apnea 04/21/2014   Past Medical History:  Diagnosis Date  . Chicken pox   . Diabetes mellitus without complication (HCC)   . History of  kidney stones   . Hyperlipidemia   . Hypertension   . Sleep apnea    CiPaP at night    Family History  Problem Relation Age of Onset  . Prostate cancer Father   . Diabetes Father   . Cancer Sister   . Anesthesia problems Neg Hx   . Hypotension Neg Hx   . Malignant hyperthermia Neg Hx   . Pseudochol deficiency Neg Hx   . Colon cancer Neg Hx   . Colon polyps Neg Hx   . Esophageal cancer Neg Hx   . Rectal cancer Neg Hx   . Stomach cancer Neg Hx     Past Surgical History:  Procedure Laterality Date  . ANKLE ARTHROSCOPY Right 06/21/2017   Procedure: RIGHT ANKLE ARTHROSCOPY WITH DEBRIDEMENT;  Surgeon: Nadara Mustard, MD;  Location: [facility] OR;  Service: Orthopedics;  Laterality: Right;  . CYSTOSCOPY W/ URETERAL STENT PLACEMENT  08/07/2011   Procedure: CYSTOSCOPY WITH RETROGRADE PYELOGRAM/URETERAL STENT PLACEMENT;  Surgeon: Ky Barban, MD;  Location: [facility];  Service: Urology;  Laterality: Left;  Cystoscopy, Left Retrograde Pyelogram, Left Ureteral Ballon Dilation, Double J Stent Placement  . GLUTEUS MINIMUS REPAIR Right 07/28/2019   Procedure: RIGHT HIP TENDON TEAR REPAIR;  Surgeon: Cammy Copa, MD;  Location: [facility];  Service: Orthopedics;  Laterality: Right;  . MENISCUS REPAIR     left knee 2018  . SHOULDER SURGERY     bilateral   Social History   Occupational History  . Occupation: Data processing manager  Tobacco Use  . Smoking status: Never Smoker  . Smokeless tobacco: Never Used  Vaping Use  . Vaping Use: Never used  Substance and Sexual Activity  . Alcohol use: No  . Drug use: No  . Sexual activity: Not on file

## 2020-05-29 ENCOUNTER — Encounter: Payer: Self-pay | Admitting: Orthopedic Surgery

## 2020-06-12 ENCOUNTER — Other Ambulatory Visit: Payer: Self-pay

## 2020-06-12 ENCOUNTER — Ambulatory Visit
Admission: RE | Admit: 2020-06-12 | Discharge: 2020-06-12 | Disposition: A | Discharge status: Home / Self Care | Payer: BC Managed Care – PPO | Source: Ambulatory Visit | Attending: Orthopedic Surgery | Admitting: Orthopedic Surgery

## 2020-06-12 DIAGNOSIS — M25551 Pain in right hip: Secondary | ICD-10-CM

## 2020-06-15 ENCOUNTER — Ambulatory Visit: Payer: BC Managed Care – PPO | Admitting: Orthopedic Surgery

## 2020-06-15 DIAGNOSIS — M25551 Pain in right hip: Secondary | ICD-10-CM

## 2020-06-19 ENCOUNTER — Encounter: Payer: Self-pay | Admitting: Orthopedic Surgery

## 2020-06-19 NOTE — Progress Notes (Signed)
Office Visit Note   [name]           Date of Birth: March 12, 1966           MRN: [id number] Visit Date: 06/15/2020 Requested by: Olive Bass, FNP [address] [place],  [place] [address] PCP: Olive Bass, FNP  Subjective: Chief Complaint  Patient presents with  . Other    Scan review    HPI: To be is a 54 year old patient with right hip pain.  He has had right hip gluteus medius and minimus repair.  He has been having some recurrent pain.  When he is walking on unlevel ground it is difficult.  Occasionally he does have days without pain.  He does take ibuprofen which helps him sleep.  Since have last seen him he had an MRI scan which is reviewed.  That scan shows that the gluteus minimus tendon is intact with attachment site.  There is some fluid surrounding bursitis.  A little bit of tendinopathy affecting the gluteus medius.              ROS: All systems reviewed are negative as they relate to the chief complaint within the history of present illness.  Patient denies  fevers or chills.   Assessment & Plan: Visit Diagnoses: No diagnosis found.  Plan: Impression is no definitive severe tearing and retraction of the prior tendon repair.  I think at this time James [name] is going to try weight loss as a method of diminishing some of the stress on the repair.  See no indication for any type of revision repair at this time.  Would be good to see him back in April for clinical recheck.  Follow-Up Instructions: No follow-ups on file.   Orders:  No orders of the defined types were placed in this encounter.  No orders of the defined types were placed in this encounter.     Procedures: No procedures performed   Clinical Data: No additional findings.  Objective: Vital Signs: There were no vitals taken for this visit.  Physical Exam:   Constitutional: Patient appears well-developed HEENT:  Head: Normocephalic Eyes:EOM are normal Neck: Normal  range of motion Cardiovascular: Normal rate Pulmonary/chest: Effort normal Neurologic: Patient is alert Skin: Skin is warm Psychiatric: Patient has normal mood and affect    Ortho Exam: Ortho exam demonstrates slight Trendelenburg gait to the right.  Does have good hip flexion abduction adduction strength.  No masses around the incision.  Specialty Comments:  No specialty comments available.  Imaging: No results found.   PMFS History: Patient Active Problem List   Diagnosis Date Noted  . Type 2 diabetes mellitus without complication (HCC) 10/03/2018  . Osteochondritis dissecans of right talus   . Essential hypertension, benign 06/08/2016  . Routine general medical examination at a health care facility 04/12/2016  . Morbid obesity (HCC) 04/12/2016  . Rash and nonspecific skin eruption 03/23/2015  . Obstructive apnea 04/21/2014   Past Medical History:  Diagnosis Date  . Chicken pox   . Diabetes mellitus without complication (HCC)   . History of kidney stones   . Hyperlipidemia   . Hypertension   . Sleep apnea    CiPaP at night    Family History  Problem Relation Age of Onset  . Prostate cancer Father   . Diabetes Father   . Cancer Sister   . Anesthesia problems Neg Hx   . Hypotension Neg Hx   . Malignant hyperthermia Neg  Hx   . Pseudochol deficiency Neg Hx   . Colon cancer Neg Hx   . Colon polyps Neg Hx   . Esophageal cancer Neg Hx   . Rectal cancer Neg Hx   . Stomach cancer Neg Hx     Past Surgical History:  Procedure Laterality Date  . ANKLE ARTHROSCOPY Right 06/21/2017   Procedure: RIGHT ANKLE ARTHROSCOPY WITH DEBRIDEMENT;  Surgeon: Nadara Mustard, MD;  Location: [facility] OR;  Service: Orthopedics;  Laterality: Right;  . CYSTOSCOPY W/ URETERAL STENT PLACEMENT  08/07/2011   Procedure: CYSTOSCOPY WITH RETROGRADE PYELOGRAM/URETERAL STENT PLACEMENT;  Surgeon: Ky Barban, MD;  Location: [facility];  Service: Urology;  Laterality: Left;  Cystoscopy, Left Retrograde  Pyelogram, Left Ureteral Ballon Dilation, Double J Stent Placement  . GLUTEUS MINIMUS REPAIR Right 07/28/2019   Procedure: RIGHT HIP TENDON TEAR REPAIR;  Surgeon: Cammy Copa, MD;  Location: [facility];  Service: Orthopedics;  Laterality: Right;  . MENISCUS REPAIR     left knee 2018  . SHOULDER SURGERY     bilateral   Social History   Occupational History  . Occupation: Data processing manager  Tobacco Use  . Smoking status: Never Smoker  . Smokeless tobacco: Never Used  Vaping Use  . Vaping Use: Never used  Substance and Sexual Activity  . Alcohol use: No  . Drug use: No  . Sexual activity: Not on file

## 2020-09-26 ENCOUNTER — Encounter: Payer: Self-pay | Admitting: Orthopedic Surgery

## 2020-09-26 DIAGNOSIS — S76011D Strain of muscle, fascia and tendon of right hip, subsequent encounter: Secondary | ICD-10-CM

## 2020-09-29 NOTE — Telephone Encounter (Signed)
noted 

## 2020-10-04 ENCOUNTER — Telehealth: Payer: Self-pay | Admitting: Orthopedic Surgery

## 2020-10-04 NOTE — Telephone Encounter (Signed)
Cindy with [facility] called stating the pt reached out to them and wants to do PT and dry needling there. Arline Asp states if this is fine with Dr. August Saucer she will need an order sent over.   Fax# [phone] * To the attention of: [facility]  Arline Asp CB# [phone]

## 2020-10-04 NOTE — Telephone Encounter (Signed)
Previously faxed but did refax again today.

## 2020-10-13 ENCOUNTER — Ambulatory Visit (INDEPENDENT_AMBULATORY_CARE_PROVIDER_SITE_OTHER): Payer: BC Managed Care – PPO | Admitting: Orthopedic Surgery

## 2020-10-13 ENCOUNTER — Encounter: Payer: Self-pay | Admitting: Orthopedic Surgery

## 2020-10-13 DIAGNOSIS — M25551 Pain in right hip: Secondary | ICD-10-CM

## 2020-10-13 NOTE — Progress Notes (Signed)
Office Visit Note   [name]           Date of Birth: 1966/01/07           MRN: [id number] Visit Date: 10/13/2020 Requested by: James Bass, FNP [address] [place],  [place] [address] PCP: James Bass, FNP  Subjective: Chief Complaint  Patient presents with  . Right Hip - Follow-up    HPI: Patient presents for follow-up of right hip.  He underwent right hip gluteus medius and minimus tendon repair 07/28/2019.  States that his hip at times is doing well but other times he has pain and weakness.  There is some degree of mild pain all the time but on certain days he is able to walk without a limp.  He also describes some left shoulder popping which has been going on for 2 months with no history of injury.  Does have a history of distal clavicle excision on that side.  Denies much in the way of weakness but does report that the popping becomes painful and irritating by the end of the day if he is doing a lot of overhead activity.              ROS: All systems reviewed are negative as they relate to the chief complaint within the history of present illness.  Patient denies  fevers or chills.   Assessment & Plan: Visit Diagnoses:  1. Pain in right hip     Plan: Impression is right hip pain with good days and bad days.  I do not think revision surgery is indicated at this time based on absence of Trendelenburg gait and reasonably well aligned pelvis when standing on the right leg.  Regarding the left shoulder I think it is possible that he may have rotator cuff pathology or biceps tendon pathology present.  Looked at it with ultrasound and could not make a clear determination about small rotator cuff tear but it does not look like there is a large rotator cuff tear present.  At correlates with his excellent strength.  Plan for the left shoulder is MRI arthrogram when the patient deems it necessary.  Got a very good look at the shoulder from a bone and  soft tissue standpoint today.  Regarding the right hip region to try him with physical therapy for stretching and strengthening.  Prescription provided.  Follow-up with [place] as needed.  Follow-Up Instructions: Return if symptoms worsen or fail to improve.   Orders:  No orders of the defined types were placed in this encounter.  No orders of the defined types were placed in this encounter.     Procedures: No procedures performed   Clinical Data: No additional findings.  Objective: Vital Signs: There were no vitals taken for this visit.  Physical Exam:   Constitutional: Patient appears well-developed HEENT:  Head: Normocephalic Eyes:EOM are normal Neck: Normal range of motion Cardiovascular: Normal rate Pulmonary/chest: Effort normal Neurologic: Patient is alert Skin: Skin is warm Psychiatric: Patient has normal mood and affect    Ortho Exam: Ortho exam on the left shoulder demonstrates excellent rotator cuff strength demonstrated supraspinatus absent muscle testing.  Has 5 out of 5 grip EPL FPL interosseous restriction extension biceps triceps and deltoid strength.  No tenderness at the [facility] joint left or right hand side.  Does have a little bit of popping in the shoulder which localizes to the shoulder and not into the biceps region with internal/external rotation  at 90 degrees of abduction on the left-hand side.  No restriction of external rotation on the left.  Examination of the right hip demonstrates 5 out of 5 hip abduction and adduction hip flexion strength.  He is able to stand with level pelvis on 1 foot on the left.  About 1 cm to 1-1/2 cm of 1 level pelvis when standing on the right hand side.  Mild tenderness to palpation over the incision.  Specialty Comments:  No specialty comments available.  Imaging: No results found.   PMFS History: Patient Active Problem List   Diagnosis Date Noted  . Type 2 diabetes mellitus without complication (HCC) 10/03/2018  .  Osteochondritis dissecans of right talus   . Essential hypertension, benign 06/08/2016  . Routine general medical examination at a health care facility 04/12/2016  . Morbid obesity (HCC) 04/12/2016  . Rash and nonspecific skin eruption 03/23/2015  . Obstructive apnea 04/21/2014   Past Medical History:  Diagnosis Date  . Chicken pox   . Diabetes mellitus without complication (HCC)   . History of kidney stones   . Hyperlipidemia   . Hypertension   . Sleep apnea    CiPaP at night    Family History  Problem Relation Age of Onset  . Prostate cancer Father   . Diabetes Father   . Cancer Sister   . Anesthesia problems Neg Hx   . Hypotension Neg Hx   . Malignant hyperthermia Neg Hx   . Pseudochol deficiency Neg Hx   . Colon cancer Neg Hx   . Colon polyps Neg Hx   . Esophageal cancer Neg Hx   . Rectal cancer Neg Hx   . Stomach cancer Neg Hx     Past Surgical History:  Procedure Laterality Date  . ANKLE ARTHROSCOPY Right 06/21/2017   Procedure: RIGHT ANKLE ARTHROSCOPY WITH DEBRIDEMENT;  Surgeon: Nadara Mustard, MD;  Location: [facility] OR;  Service: Orthopedics;  Laterality: Right;  . CYSTOSCOPY W/ URETERAL STENT PLACEMENT  08/07/2011   Procedure: CYSTOSCOPY WITH RETROGRADE PYELOGRAM/URETERAL STENT PLACEMENT;  Surgeon: Ky Barban, MD;  Location: [facility];  Service: Urology;  Laterality: Left;  Cystoscopy, Left Retrograde Pyelogram, Left Ureteral Ballon Dilation, Double J Stent Placement  . GLUTEUS MINIMUS REPAIR Right 07/28/2019   Procedure: RIGHT HIP TENDON TEAR REPAIR;  Surgeon: Cammy Copa, MD;  Location: [facility];  Service: Orthopedics;  Laterality: Right;  . MENISCUS REPAIR     left knee 2018  . SHOULDER SURGERY     bilateral   Social History   Occupational History  . Occupation: Data processing manager  Tobacco Use  . Smoking status: Never Smoker  . Smokeless tobacco: Never Used  Vaping Use  . Vaping Use: Never used  Substance and Sexual Activity  .  Alcohol use: No  . Drug use: No  . Sexual activity: Not on file

## 2020-10-14 ENCOUNTER — Ambulatory Visit: Payer: BC Managed Care – PPO | Admitting: Orthopedic Surgery

## 2021-04-17 ENCOUNTER — Other Ambulatory Visit: Payer: Self-pay

## 2021-04-17 ENCOUNTER — Ambulatory Visit (INDEPENDENT_AMBULATORY_CARE_PROVIDER_SITE_OTHER): Payer: Self-pay | Admitting: Orthopedic Surgery

## 2021-04-17 ENCOUNTER — Encounter: Payer: Self-pay | Admitting: Orthopedic Surgery

## 2021-04-17 ENCOUNTER — Ambulatory Visit: Payer: Self-pay

## 2021-04-17 DIAGNOSIS — M25522 Pain in left elbow: Secondary | ICD-10-CM

## 2021-04-23 ENCOUNTER — Encounter: Payer: Self-pay | Admitting: Orthopedic Surgery

## 2021-04-23 NOTE — Progress Notes (Signed)
Office Visit Note   [name]           Date of Birth: February 23, 1966           MRN: [id number] Visit Date: 04/17/2021 Requested by: Olive Bass, FNP [address] [place],  [place] [address] PCP: Olive Bass, FNP  Subjective: Chief Complaint  Patient presents with   Left Elbow - Pain    HPI: Patient presents for evaluation of left elbow pain.  He has had pain for months.  He was holding a couch and twisted which gave him a pronation type force injury to that left elbow.  Reports burning at times.  He is left-hand dominant.  Has some difficulty driving.  Hard for him to push the door open.  Pronation is difficult for him much more so than supination.  He did do some rehabilitation including therapy and needling which did not help.  Hard for him to reach and pull himself up on a ladder.  He has about 4 years to retire.  Denies any neck pain.              ROS: All systems reviewed are negative as they relate to the chief complaint within the history of present illness.  Patient denies  fevers or chills.   Assessment & Plan: Visit Diagnoses:  1. Pain in left elbow     Plan: Impression is traumatic left elbow injury with palpable and functional biceps and triceps tendon.  Not too much tenderness around the flexor pronator mass attachment or extensor mass attachment on the lateral side of the elbow.  Radiographs intact.  I think this could represent a partial tear of the biceps tendon.  Structurally speaking an injury has occurred but it is difficult to say whether this should be a self-limited strain pattern or if there is actual structural damage in the elbow.  Stability wise he looks good today.  Plan at this time is for him to call in early January if he is not 50% better than he is right now.  Would consider MRI scanning at that time of the elbow.  Follow-Up Instructions: Return if symptoms worsen or fail to improve.   Orders:   Orders Placed This Encounter  Procedures   XR Elbow Complete Left (3+View)   No orders of the defined types were placed in this encounter.     Procedures: No procedures performed   Clinical Data: No additional findings.  Objective: Vital Signs: There were no vitals taken for this visit.  Physical Exam:   Constitutional: Patient appears well-developed HEENT:  Head: Normocephalic Eyes:EOM are normal Neck: Normal range of motion Cardiovascular: Normal rate Pulmonary/chest: Effort normal Neurologic: Patient is alert Skin: Skin is warm Psychiatric: Patient has normal mood and affect   Ortho Exam: Ortho exam demonstrates 5 out of 5 grip EPL FPL interosseous wrist flexion extension bicep triceps and deltoid strength.  He has palpable radial pulses bilaterally.  No real pain or weakness with supination bilaterally.  Mild pain with pronation.  No pain with finger flexion wrist flexion on the left-hand side.  No tenderness of the medial or lateral epicondyles and no instability on ligament testing on the left-hand side.  No swelling.  Triceps is functional and strong on the left.  No tenderness to palpation over the biceps tendon itself.  Specialty Comments:  No specialty comments available.  Imaging: No results found.   PMFS History: Patient Active Problem  List   Diagnosis Date Noted   Type 2 diabetes mellitus without complication (HCC) 10/03/2018   Osteochondritis dissecans of right talus    Essential hypertension, benign 06/08/2016   Routine general medical examination at a health care facility 04/12/2016   Morbid obesity (HCC) 04/12/2016   Rash and nonspecific skin eruption 03/23/2015   Obstructive apnea 04/21/2014   Past Medical History:  Diagnosis Date   Chicken pox    Diabetes mellitus without complication (HCC)    History of kidney stones    Hyperlipidemia    Hypertension    Sleep apnea    CiPaP at night    Family History  Problem Relation Age of Onset    Prostate cancer Father    Diabetes Father    Cancer Sister    Anesthesia problems Neg Hx    Hypotension Neg Hx    Malignant hyperthermia Neg Hx    Pseudochol deficiency Neg Hx    Colon cancer Neg Hx    Colon polyps Neg Hx    Esophageal cancer Neg Hx    Rectal cancer Neg Hx    Stomach cancer Neg Hx     Past Surgical History:  Procedure Laterality Date   ANKLE ARTHROSCOPY Right 06/21/2017   Procedure: RIGHT ANKLE ARTHROSCOPY WITH DEBRIDEMENT;  Surgeon: Nadara Mustard, MD;  Location: [name] OR;  Service: Orthopedics;  Laterality: Right;   CYSTOSCOPY W/ URETERAL STENT PLACEMENT  08/07/2011   Procedure: CYSTOSCOPY WITH RETROGRADE PYELOGRAM/URETERAL STENT PLACEMENT;  Surgeon: Ky Barban, MD;  Location: [facility];  Service: Urology;  Laterality: Left;  Cystoscopy, Left Retrograde Pyelogram, Left Ureteral Ballon Dilation, Double J Stent Placement   GLUTEUS MINIMUS REPAIR Right 07/28/2019   Procedure: RIGHT HIP TENDON TEAR REPAIR;  Surgeon: Cammy Copa, MD;  Location: [facility];  Service: Orthopedics;  Laterality: Right;   MENISCUS REPAIR     left knee 2018   SHOULDER SURGERY     bilateral   Social History   Occupational History   Occupation: Data processing manager  Tobacco Use   Smoking status: Never   Smokeless tobacco: Never  Vaping Use   Vaping Use: Never used  Substance and Sexual Activity   Alcohol use: No   Drug use: No   Sexual activity: Not on file

## 2021-05-31 ENCOUNTER — Encounter: Payer: Self-pay | Admitting: Orthopedic Surgery

## 2021-05-31 DIAGNOSIS — M25522 Pain in left elbow: Secondary | ICD-10-CM

## 2021-06-05 NOTE — Telephone Encounter (Signed)
Left message with Betsy L with imaging to check status for MRI.

## 2021-06-06 ENCOUNTER — Telehealth: Payer: Self-pay | Admitting: Orthopedic Surgery

## 2021-06-06 NOTE — Telephone Encounter (Signed)
Left message for pt to return call and sch MRI left elbow review with Dr. August Saucer, after 06/18/21

## 2021-06-18 ENCOUNTER — Ambulatory Visit
Admission: RE | Admit: 2021-06-18 | Discharge: 2021-06-18 | Disposition: A | Discharge status: Home / Self Care | Payer: Self-pay | Source: Ambulatory Visit | Attending: Orthopedic Surgery | Admitting: Orthopedic Surgery

## 2021-06-18 ENCOUNTER — Other Ambulatory Visit: Payer: Self-pay

## 2021-06-18 DIAGNOSIS — M25522 Pain in left elbow: Secondary | ICD-10-CM

## 2021-06-21 ENCOUNTER — Ambulatory Visit (INDEPENDENT_AMBULATORY_CARE_PROVIDER_SITE_OTHER): Payer: BC Managed Care – PPO | Admitting: Orthopedic Surgery

## 2021-06-21 ENCOUNTER — Other Ambulatory Visit: Payer: Self-pay

## 2021-06-21 ENCOUNTER — Ambulatory Visit: Payer: Self-pay

## 2021-06-21 DIAGNOSIS — M25511 Pain in right shoulder: Secondary | ICD-10-CM | POA: Diagnosis not present

## 2021-06-21 DIAGNOSIS — M7551 Bursitis of right shoulder: Secondary | ICD-10-CM

## 2021-06-21 MED ORDER — MELOXICAM 15 MG PO TABS: ORAL_TABLET | ORAL | 0 refills | Status: DC

## 2021-06-21 MED ORDER — HYDROCODONE-ACETAMINOPHEN 5-325 MG PO TABS: ORAL_TABLET | ORAL | 0 refills | Status: DC

## 2021-06-26 ENCOUNTER — Encounter: Payer: Self-pay | Admitting: Orthopedic Surgery

## 2021-06-26 MED ORDER — LIDOCAINE HCL 1 % IJ SOLN
5.0000 mL | INTRAMUSCULAR | Status: AC | PRN
Start: 2021-06-21 — End: 2021-06-21
  Administered 2021-06-21: 10:00:00 5 mL

## 2021-06-26 MED ORDER — METHYLPREDNISOLONE ACETATE 40 MG/ML IJ SUSP
40.0000 mg | INTRAMUSCULAR | Status: AC | PRN
Start: 2021-06-21 — End: 2021-06-21
  Administered 2021-06-21: 10:00:00 40 mg via INTRA_ARTICULAR

## 2021-06-26 MED ORDER — BUPIVACAINE HCL 0.5 % IJ SOLN
9.0000 mL | INTRAMUSCULAR | Status: AC | PRN
Start: 2021-06-21 — End: 2021-06-21
  Administered 2021-06-21: 10:00:00 9 mL via INTRA_ARTICULAR

## 2021-06-26 NOTE — Progress Notes (Signed)
Office Visit Note   [name]           Date of Birth: [birth year]           MRN: [id number] Visit Date: 06/21/2021 Requested by: Olive Bass, FNP [address] [place],  [place] [address] PCP: Olive Bass, FNP  Subjective: Chief Complaint  Patient presents with   Left Elbow - Follow-up   Right Shoulder - Pain   Other     Scan review    HPI: Patient presents for evaluation of left elbow pain.  He describes worsening pain and some difficulty with ADLs.  He also is more concerned about right shoulder pain which has developed over the last 3 weeks.  Denies any history of injury to the right shoulder.  Has prior history of right shoulder rotator cuff repair.  Denies much in the way of neck pain.  Most of his shoulder pain is anterior.  Hurts in the sleep and hurts in the deltoid and neck region.  Has taken some over-the-counter anti-inflammatories without much relief.  His left elbow is stable.  That MRI scan is reviewed with the patient today and it does show tendinosis of the distal biceps attachment but no partial tearing.  I think this can make sense with his current level of symptoms.              ROS: All systems reviewed are negative as they relate to the chief complaint within the history of present illness.  Patient denies  fevers or chills.   Assessment & Plan: Visit Diagnoses:  1. Right shoulder pain, unspecified chronicity     Plan: Impression is left elbow tendinosis at the distal biceps attachment.  No operative intervention indicated at this time.  I think it is possible this could rupture with significant force at which time we would have to address that problem surgically; however, nothing really to do proactively at this time outside of activity modification.  We we will inject the right shoulder subacromial space today and have him call in 3 weeks to set up an MRI scan if symptoms not improved.  Mobic and Norco  prescribed.    Follow-Up Instructions: Return if symptoms worsen or fail to improve.   Orders:  Orders Placed This Encounter  Procedures   XR Shoulder Right   Meds ordered this encounter  Medications   meloxicam (MOBIC) 15 MG tablet    Sig: 1 po q d x 3 weeks    Dispense:  30 tablet    Refill:  0   HYDROcodone-acetaminophen (NORCO/VICODIN) 5-325 MG tablet    Sig: 1 po bid prn pain    Dispense:  30 tablet    Refill:  0      Procedures: Large Joint Inj: R subacromial bursa on 06/21/2021 10:27 AM Indications: diagnostic evaluation and pain Details: 18 G 1.5 in needle, posterior approach  Arthrogram: No  Medications: 9 mL bupivacaine 0.5 %; 40 mg methylPREDNISolone acetate 40 MG/ML; 5 mL lidocaine 1 % Outcome: tolerated well, no immediate complications Procedure, treatment alternatives, risks and benefits explained, specific risks discussed. Consent was given by the patient. Immediately prior to procedure a time out was called to verify the correct patient, procedure, equipment, support staff and site/side marked as required. Patient was prepped and draped in the usual sterile fashion.      Clinical Data: No additional findings.  Objective: Vital Signs: There were no vitals taken for  this visit.  Physical Exam:   Constitutional: Patient appears well-developed HEENT:  Head: Normocephalic Eyes:EOM are normal Neck: Normal range of motion Cardiovascular: Normal rate Pulmonary/chest: Effort normal Neurologic: Patient is alert Skin: Skin is warm Psychiatric: Patient has normal mood and affect   Ortho Exam: Ortho exam demonstrates symmetric supination strength with mild tenderness over the biceps tendon on the left.  Flexion strength symmetric bilaterally.  EPL FPL interosseous strength intact bilaterally.  Right shoulder demonstrates painful range of motion with intact rotator cuff strength.  AC joint nontender.  Ultrasound examination nondiagnostic.  No coarse  grinding or crepitus with internal and external rotation of the right arm.  Specialty Comments:  No specialty comments available.  Imaging: No results found.   PMFS History: Patient Active Problem List   Diagnosis Date Noted   Type 2 diabetes mellitus without complication (HCC) 10/03/2018   Osteochondritis dissecans of right talus    Essential hypertension, benign 06/08/2016   Routine general medical examination at a health care facility 04/12/2016   Morbid obesity (HCC) 04/12/2016   Rash and nonspecific skin eruption 03/23/2015   Obstructive apnea 04/21/2014   Past Medical History:  Diagnosis Date   Chicken pox    Diabetes mellitus without complication (HCC)    History of kidney stones    Hyperlipidemia    Hypertension    Sleep apnea    CiPaP at night    Family History  Problem Relation Age of Onset   Prostate cancer Father    Diabetes Father    Cancer Sister    Anesthesia problems Neg Hx    Hypotension Neg Hx    Malignant hyperthermia Neg Hx    Pseudochol deficiency Neg Hx    Colon cancer Neg Hx    Colon polyps Neg Hx    Esophageal cancer Neg Hx    Rectal cancer Neg Hx    Stomach cancer Neg Hx     Past Surgical History:  Procedure Laterality Date   ANKLE ARTHROSCOPY Right 06/21/2017   Procedure: RIGHT ANKLE ARTHROSCOPY WITH DEBRIDEMENT;  Surgeon: Nadara Mustard, MD;  Location: [facility] OR;  Service: Orthopedics;  Laterality: Right;   CYSTOSCOPY W/ URETERAL STENT PLACEMENT  08/07/2011   Procedure: CYSTOSCOPY WITH RETROGRADE PYELOGRAM/URETERAL STENT PLACEMENT;  Surgeon: Ky Barban, MD;  Location: [facility];  Service: Urology;  Laterality: Left;  Cystoscopy, Left Retrograde Pyelogram, Left Ureteral Ballon Dilation, Double J Stent Placement   GLUTEUS MINIMUS REPAIR Right 07/28/2019   Procedure: RIGHT HIP TENDON TEAR REPAIR;  Surgeon: Cammy Copa, MD;  Location: [facility];  Service: Orthopedics;  Laterality: Right;   MENISCUS REPAIR     left knee  2018   SHOULDER SURGERY     bilateral   Social History   Occupational History   Occupation: Data processing manager  Tobacco Use   Smoking status: Never   Smokeless tobacco: Never  Vaping Use   Vaping Use: Never used  Substance and Sexual Activity   Alcohol use: No   Drug use: No   Sexual activity: Not on file

## 2021-06-26 NOTE — Progress Notes (Signed)
Office Visit Note   [name]           Date of Birth: [birth year]           MRN: [id number] Visit Date: 06/21/2021 Requested by: Olive Bass, FNP [address] [place],  [place] [address] PCP: Olive Bass, FNP  Subjective: Chief Complaint  Patient presents with   Left Elbow - Follow-up   Right Shoulder - Pain   Other     Scan review    HPI: Patient presents for follow-up of left elbow MRI scan.  Left elbow MRI scan shows tendinosis at the distal biceps attachment.  Describes pain with ADLs but overall the symptoms have not appreciably changed compared to prior clinic visit.  Denies any neck symptoms or radiating pain into that left arm.  Takes over-the-counter medication with some relief in the left elbow.  Patient also reports 3-week history of atraumatic onset right shoulder pain.  Denies any history of injury.  History of right shoulder rotator cuff tear repair in the past.  Reports very little neck pain associated with this.  Most of his pain in the shoulder is anterior.  Hurts in his sleep at night.              ROS: All systems reviewed are negative as they relate to the chief complaint within the history of present illness.  Patient denies  fevers or chills.   Assessment & Plan: Visit Diagnoses:  1. Right shoulder pain, unspecified chronicity     Plan: Impression is left elbow biceps tendinosis with no partial tearing.  Plan is activity modification.  I think this is at rupture risk.  Nonetheless no proactive intervention at this time indicated.  Regarding the right shoulder this is slightly more problematic.  Radiographs unremarkable.  He does have a significant mount of pain but his strength looks pretty reasonable.  Look at the biceps tendon and rotator cuff with ultrasound but cannot really penetrate to the level we needed to to get clear imaging.  Plan at this time is subacromial injection with Mobic and one-time  prescription for Norco.  [name] will call in 3 weeks to decide for or against MRI scanning at that time.  Follow-Up Instructions: Return if symptoms worsen or fail to improve.   Orders:  Orders Placed This Encounter  Procedures   XR Shoulder Right   Meds ordered this encounter  Medications   meloxicam (MOBIC) 15 MG tablet    Sig: 1 po q d x 3 weeks    Dispense:  30 tablet    Refill:  0   HYDROcodone-acetaminophen (NORCO/VICODIN) 5-325 MG tablet    Sig: 1 po bid prn pain    Dispense:  30 tablet    Refill:  0      Procedures: No procedures performed   Clinical Data: No additional findings.  Objective: Vital Signs: There were no vitals taken for this visit.  Physical Exam:   Constitutional: Patient appears well-developed HEENT:  Head: Normocephalic Eyes:EOM are normal Neck: Normal range of motion Cardiovascular: Normal rate Pulmonary/chest: Effort normal Neurologic: Patient is alert Skin: Skin is warm Psychiatric: Patient has normal mood and affect   Ortho Exam: Ortho exam demonstrates symmetric supination strength in both forearms but slightly more painful on the left compared to the right.  Biceps tendon is palpable and intact.  EPL FPL interosseous strength intact.  Radial pulse intact.  No masses lymphadenopathy or skin  changes noted in that shoulder girdle region.  He has good rotator cuff strength infraspinatus supraspinatus and subscap muscle testing.  Ultrasound examination of that shoulder did not achieve the penetration necessary for clear imaging.  No discrete AC joint tenderness present right versus left.  Specialty Comments:  No specialty comments available.  Imaging: No results found.   PMFS History: Patient Active Problem List   Diagnosis Date Noted   Type 2 diabetes mellitus without complication (HCC) 10/03/2018   Osteochondritis dissecans of right talus    Essential hypertension, benign 06/08/2016   Routine general medical examination at a  health care facility 04/12/2016   Morbid obesity (HCC) 04/12/2016   Rash and nonspecific skin eruption 03/23/2015   Obstructive apnea 04/21/2014   Past Medical History:  Diagnosis Date   Chicken pox    Diabetes mellitus without complication (HCC)    History of kidney stones    Hyperlipidemia    Hypertension    Sleep apnea    CiPaP at night    Family History  Problem Relation Age of Onset   Prostate cancer Father    Diabetes Father    Cancer Sister    Anesthesia problems Neg Hx    Hypotension Neg Hx    Malignant hyperthermia Neg Hx    Pseudochol deficiency Neg Hx    Colon cancer Neg Hx    Colon polyps Neg Hx    Esophageal cancer Neg Hx    Rectal cancer Neg Hx    Stomach cancer Neg Hx     Past Surgical History:  Procedure Laterality Date   ANKLE ARTHROSCOPY Right 06/21/2017   Procedure: RIGHT ANKLE ARTHROSCOPY WITH DEBRIDEMENT;  Surgeon: Nadara Mustard, MD;  Location: [facility];  Service: Orthopedics;  Laterality: Right;   CYSTOSCOPY W/ URETERAL STENT PLACEMENT  08/07/2011   Procedure: CYSTOSCOPY WITH RETROGRADE PYELOGRAM/URETERAL STENT PLACEMENT;  Surgeon: Ky Barban, MD;  Location: [facility];  Service: Urology;  Laterality: Left;  Cystoscopy, Left Retrograde Pyelogram, Left Ureteral Ballon Dilation, Double J Stent Placement   GLUTEUS MINIMUS REPAIR Right 07/28/2019   Procedure: RIGHT HIP TENDON TEAR REPAIR;  Surgeon: Cammy Copa, MD;  Location: [facility];  Service: Orthopedics;  Laterality: Right;   MENISCUS REPAIR     left knee 2018   SHOULDER SURGERY     bilateral   Social History   Occupational History   Occupation: Data processing manager  Tobacco Use   Smoking status: Never   Smokeless tobacco: Never  Vaping Use   Vaping Use: Never used  Substance and Sexual Activity   Alcohol use: No   Drug use: No   Sexual activity: Not on file

## 2021-07-05 ENCOUNTER — Encounter: Payer: Self-pay | Admitting: Orthopedic Surgery

## 2021-07-05 DIAGNOSIS — M25511 Pain in right shoulder: Secondary | ICD-10-CM

## 2021-07-11 ENCOUNTER — Telehealth: Payer: Self-pay | Admitting: Orthopedic Surgery

## 2021-07-11 NOTE — Telephone Encounter (Signed)
LMOM for pt to return phone call to sch MR arthrogram right shoulder review with Dr. Jonathon Jordan, after 07/26/21

## 2021-07-26 ENCOUNTER — Ambulatory Visit
Admission: RE | Admit: 2021-07-26 | Discharge: 2021-07-26 | Disposition: A | Discharge status: Home / Self Care | Payer: BC Managed Care – PPO | Source: Ambulatory Visit | Attending: Surgical | Admitting: Surgical

## 2021-07-26 DIAGNOSIS — M25511 Pain in right shoulder: Secondary | ICD-10-CM

## 2021-07-26 MED ORDER — IOPAMIDOL (ISOVUE-M 200) INJECTION 41%: 15.0000 mL | Freq: Once | INTRAMUSCULAR | Status: DC

## 2021-07-28 ENCOUNTER — Other Ambulatory Visit: Payer: Self-pay

## 2021-07-28 ENCOUNTER — Encounter: Payer: Self-pay | Admitting: Orthopedic Surgery

## 2021-07-28 ENCOUNTER — Ambulatory Visit: Payer: BC Managed Care – PPO | Admitting: Orthopedic Surgery

## 2021-07-28 DIAGNOSIS — M75121 Complete rotator cuff tear or rupture of right shoulder, not specified as traumatic: Secondary | ICD-10-CM

## 2021-07-30 ENCOUNTER — Encounter: Payer: Self-pay | Admitting: Orthopedic Surgery

## 2021-07-30 NOTE — Progress Notes (Signed)
Office Visit Note   [name]           Date of Birth: [birth year]           MRN: [id number] Visit Date: 07/28/2021 Requested by: Olive Bass, FNP [address] [place],  [place] [address] PCP: Olive Bass, FNP  Subjective: Chief Complaint  Patient presents with   Right Shoulder - Pain, Follow-up    HPI: Patient presents for evaluation of right shoulder pain.  Since he was last seen he had an MRI scan of the right shoulder which does show a full-thickness partial width tear of the anterior fibers of the supraspinatus.  Subscap intact infraspinatus intact.  Our biceps tenodesis has been performed.  He also has some deltoid streaking of uncertain significance.  Denies any new neck pain.  Symptoms are severe and interfering with his sleep.              ROS: All systems reviewed are negative as they relate to the chief complaint within the history of present illness.  Patient denies  fevers or chills.   Assessment & Plan: Visit Diagnoses:  1. Complete tear of right rotator cuff, unspecified whether traumatic     Plan: Impression is right shoulder rotator cuff tear.  Plan is right shoulder rotator cuff tear repair.  Risk and benefits are discussed with the patient male him to infection nerve vessel damage incomplete pain relief and incomplete restoration of function.  No radiculopathy or neck symptoms today.  Would use postop CPM brace.  All questions answered.  Anticipate 3 to 4 months to full recovery.  We should be able to discontinue the sling after 2 to 3 weeks based on size and retraction of the tear.  Follow-Up Instructions: No follow-ups on file.   Orders:  No orders of the defined types were placed in this encounter.  No orders of the defined types were placed in this encounter.     Procedures: No procedures performed   Clinical Data: No additional findings.  Objective: Vital Signs: There were no vitals  taken for this visit.  Physical Exam:   Constitutional: Patient appears well-developed HEENT:  Head: Normocephalic Eyes:EOM are normal Neck: Normal range of motion Cardiovascular: Normal rate Pulmonary/chest: Effort normal Neurologic: Patient is alert Skin: Skin is warm Psychiatric: Patient has normal mood and affect   Ortho Exam: Ortho exam demonstrates full active and passive range of motion of the elbow and wrist on the right-hand side.  Right shoulder range of motion is painful.  There is no asymmetric loss of external rotation right versus left.  Both are about 45 degrees.  Neck range of motion is full.  No paresthesias C5-T1.  I do think it is possible that the amount of pain he is having could indicate early adhesive capsulitis which we can better assess with the arthroscopic examination of the joint.  All questions answered about surgery.  Specialty Comments:  No specialty comments available.  Imaging: No results found.   PMFS History: Patient Active Problem List   Diagnosis Date Noted   Type 2 diabetes mellitus without complication (HCC) 10/03/2018   Osteochondritis dissecans of right talus    Essential hypertension, benign 06/08/2016   Routine general medical examination at a health care facility 04/12/2016   Morbid obesity (HCC) 04/12/2016   Rash and nonspecific skin eruption 03/23/2015   Obstructive apnea 04/21/2014   Past Medical History:  Diagnosis Date   Chicken  pox    Diabetes mellitus without complication (HCC)    History of kidney stones    Hyperlipidemia    Hypertension    Sleep apnea    CiPaP at night    Family History  Problem Relation Age of Onset   Prostate cancer Father    Diabetes Father    Cancer Sister    Anesthesia problems Neg Hx    Hypotension Neg Hx    Malignant hyperthermia Neg Hx    Pseudochol deficiency Neg Hx    Colon cancer Neg Hx    Colon polyps Neg Hx    Esophageal cancer Neg Hx    Rectal cancer Neg Hx    Stomach cancer  Neg Hx     Past Surgical History:  Procedure Laterality Date   ANKLE ARTHROSCOPY Right 06/21/2017   Procedure: RIGHT ANKLE ARTHROSCOPY WITH DEBRIDEMENT;  Surgeon: Nadara Mustard, MD;  Location: [facility] OR;  Service: Orthopedics;  Laterality: Right;   CYSTOSCOPY W/ URETERAL STENT PLACEMENT  08/07/2011   Procedure: CYSTOSCOPY WITH RETROGRADE PYELOGRAM/URETERAL STENT PLACEMENT;  Surgeon: Ky Barban, MD;  Location: [facility];  Service: Urology;  Laterality: Left;  Cystoscopy, Left Retrograde Pyelogram, Left Ureteral Ballon Dilation, Double J Stent Placement   GLUTEUS MINIMUS REPAIR Right 07/28/2019   Procedure: RIGHT HIP TENDON TEAR REPAIR;  Surgeon: Cammy Copa, MD;  Location: [facility];  Service: Orthopedics;  Laterality: Right;   MENISCUS REPAIR     left knee 2018   SHOULDER SURGERY     bilateral   Social History   Occupational History   Occupation: Data processing manager  Tobacco Use   Smoking status: Never   Smokeless tobacco: Never  Vaping Use   Vaping Use: Never used  Substance and Sexual Activity   Alcohol use: No   Drug use: No   Sexual activity: Not on file

## 2021-07-31 ENCOUNTER — Encounter: Payer: Self-pay | Admitting: Orthopedic Surgery

## 2021-08-01 NOTE — Progress Notes (Signed)
Surgical Instructions    Your procedure is scheduled on Monday February 6th.  Report to [facility] Main Entrance "A" at 5:30 A.M., then check in with the Admitting office.  Call this number if you have problems the morning of surgery:  [phone]   If you have any questions prior to your surgery date call [phone]: Open Monday-Friday 8am-4pm    Remember:  Do not eat after midnight the night before your surgery  You may drink clear liquids until 4:30am the morning of your surgery.   Clear liquids allowed are: Water, Non-Citrus Juices (without pulp), Carbonated Beverages, Clear Tea, Black Coffee ONLY (NO MILK, CREAM OR POWDERED CREAMER of any kind), and Gatorade    Take these medicines the morning of surgery with A SIP OF WATER: None    As of today, STOP taking any Aspirin (unless otherwise instructed by your surgeon) Aleve, Naproxen, Ibuprofen, Motrin, Advil, Goody's, BC's, all herbal medications, fish oil, and all vitamins.  WHAT DO I DO ABOUT MY DIABETES MEDICATION?   Do not take oral diabetes medicines (Dapagliflozin-metFORMIN HCl ER (XIGDUO XR) 10-998 MG TB24 or pioglitazone (ACTOS) 30 MG tablet) the morning of surgery    DO NOT TAKE Dapagliflozin-metFORMIN HCl ER (XIGDUO XR) 10-998 MG TB24 the day before  surgery.   THE NIGHT BEFORE SURGERY, do not take bedtime dose of Novolog insulin.       THE MORNING OF SURGERY  The day of surgery, do not take other diabetes injectables, including Byetta (exenatide), Bydureon (exenatide ER), Victoza (liraglutide), or Trulicity (dulaglutide).  If your CBG is greater than 220 mg/dL, you may take  of your sliding scale (Novolog) dose of insulin.   HOW TO MANAGE YOUR DIABETES BEFORE AND AFTER SURGERY  Why is it important to control my blood sugar before and after surgery? Improving blood sugar levels before and after surgery helps healing and can limit problems. A way of improving blood sugar control is eating a healthy diet  by:  Eating less sugar and carbohydrates  Increasing activity/exercise  Talking with your doctor about reaching your blood sugar goals High blood sugars (greater than 180 mg/dL) can raise your risk of infections and slow your recovery, so you will need to focus on controlling your diabetes during the weeks before surgery. Make sure that the doctor who takes care of your diabetes knows about your planned surgery including the date and location.  How do I manage my blood sugar before surgery? Check your blood sugar at least 4 times a day, starting 2 days before surgery, to make sure that the level is not too high or low.  Check your blood sugar the morning of your surgery when you wake up and every 2 hours until you get to the Short Stay unit.  If your blood sugar is less than 70 mg/dL, you will need to treat for low blood sugar: Do not take insulin. Treat a low blood sugar (less than 70 mg/dL) with  cup of clear juice (cranberry or apple), 4 glucose tablets, OR glucose gel. Recheck blood sugar in 15 minutes after treatment (to make sure it is greater than 70 mg/dL). If your blood sugar is not greater than 70 mg/dL on recheck, call [phone] for further instructions. Report your blood sugar to the short stay nurse when you get to Short Stay.  If you are admitted to the hospital after surgery: Your blood sugar will be checked by the staff and you will probably be given insulin after surgery (  instead of oral diabetes medicines) to make sure you have good blood sugar levels. The goal for blood sugar control after surgery is 80-180 mg/dL.  After your COVID test   You are not required to quarantine however you are required to wear a well-fitting mask when you are out and around people not in your household.  If your mask becomes wet or soiled, replace with a new one.  Wash your hands often with soap and water for 20 seconds or clean your hands with an alcohol-based hand sanitizer that contains  at least 60% alcohol.  Do not share personal items.  Notify your provider: if you are in close contact with someone who has COVID  or if you develop a fever of 100.4 or greater, sneezing, cough, sore throat, shortness of breath or body aches.           Do not wear jewelry  Do not wear lotions, powders, colognes, or deodorant. Do not shave 48 hours prior to surgery.  Men may shave face and neck. Do not bring valuables to the hospital. Do not wear nail polish, gel polish, artificial nails, or any other type of covering on natural nails (fingers and toes) If you have artificial nails or gel coating that need to be removed by a nail salon, please have this removed prior to surgery. Artificial nails or gel coating may interfere with anesthesia's ability to adequately monitor your vital signs.             [facility] is not responsible for any belongings or valuables.  Do NOT Smoke (Tobacco/Vaping)  24 hours prior to your procedure  If you use a CPAP at night, you may bring your mask for your overnight stay.   Contacts, glasses, hearing aids, dentures or partials may not be worn into surgery, please bring cases for these belongings   For patients admitted to the hospital, discharge time will be determined by your treatment team.   Patients discharged the day of surgery will not be allowed to drive home, and someone needs to stay with them for 24 hours.  NO VISITORS WILL BE ALLOWED IN PRE-OP WHERE PATIENTS ARE PREPPED FOR SURGERY.  ONLY 1 SUPPORT PERSON MAY BE PRESENT IN THE WAITING ROOM WHILE YOU ARE IN SURGERY.  IF YOU ARE TO BE ADMITTED, ONCE YOU ARE IN YOUR ROOM YOU WILL BE ALLOWED TWO (2) VISITORS. 1 (ONE) VISITOR MAY STAY OVERNIGHT BUT MUST ARRIVE TO THE ROOM BY 8pm.  Minor children may have two parents present. Special consideration for safety and communication needs will be reviewed on a case by case basis.  Special instructions:    Oral Hygiene is also important to reduce your risk  of infection.  Remember - BRUSH YOUR TEETH THE MORNING OF SURGERY WITH YOUR REGULAR TOOTHPASTE   [facility]- Preparing For Surgery  Before surgery, you can play an important role. Because skin is not sterile, your skin needs to be as free of germs as possible. You can reduce the number of germs on your skin by washing with CHG (chlorahexidine gluconate) Soap before surgery.  CHG is an antiseptic cleaner which kills germs and bonds with the skin to continue killing germs even after washing.     Please do not use if you have an allergy to CHG or antibacterial soaps. If your skin becomes reddened/irritated stop using the CHG.  Do not shave (including legs and underarms) for at least 48 hours prior to first CHG shower. It  is OK to shave your face.  Please follow these instructions carefully.     Shower the NIGHT BEFORE SURGERY and the MORNING OF SURGERY with CHG Soap.   If you chose to wash your hair, wash your hair first as usual with your normal shampoo. After you shampoo, rinse your hair and body thoroughly to remove the shampoo.  Then [employer] and genitals (private parts) with your normal soap and rinse thoroughly to remove soap.  After that Use CHG Soap as you would any other liquid soap. You can apply CHG directly to the skin and wash gently with a scrungie or a clean washcloth.   Apply the CHG Soap to your body ONLY FROM THE NECK DOWN.  Do not use on open wounds or open sores. Avoid contact with your eyes, ears, mouth and genitals (private parts). Wash Face and genitals (private parts)  with your normal soap.   Wash thoroughly, paying special attention to the area where your surgery will be performed.  Thoroughly rinse your body with warm water from the neck down.  DO NOT shower/wash with your normal soap after using and rinsing off the CHG Soap.  Pat yourself dry with a CLEAN TOWEL.  Wear CLEAN PAJAMAS to bed the night before surgery  Place CLEAN SHEETS on your bed the night  before your surgery  DO NOT SLEEP WITH PETS.   Day of Surgery:  Take a shower with CHG soap. Wear Clean/Comfortable clothing the morning of surgery Do not apply any deodorants/lotions.   Remember to brush your teeth WITH YOUR REGULAR TOOTHPASTE.   Please read over the following fact sheets that you were given.

## 2021-08-02 ENCOUNTER — Other Ambulatory Visit: Payer: Self-pay

## 2021-08-02 ENCOUNTER — Encounter (HOSPITAL_COMMUNITY)
Admission: RE | Admit: 2021-08-02 | Discharge: 2021-08-02 | Disposition: A | Discharge status: Home / Self Care | Payer: BC Managed Care – PPO | Source: Ambulatory Visit | Attending: Orthopedic Surgery | Admitting: Orthopedic Surgery

## 2021-08-02 ENCOUNTER — Encounter: Payer: Self-pay | Admitting: Orthopedic Surgery

## 2021-08-02 ENCOUNTER — Encounter (HOSPITAL_COMMUNITY): Payer: Self-pay

## 2021-08-02 VITALS — BP 140/100 | HR 89 | Temp 98.2°F | Resp 18 | Ht 67.0 in | Wt 282.5 lb

## 2021-08-02 DIAGNOSIS — Z01818 Encounter for other preprocedural examination: Secondary | ICD-10-CM | POA: Insufficient documentation

## 2021-08-02 DIAGNOSIS — R7309 Other abnormal glucose: Secondary | ICD-10-CM | POA: Diagnosis not present

## 2021-08-02 HISTORY — DX: Attention-deficit hyperactivity disorder, unspecified type: F90.9

## 2021-08-02 LAB — CBC
HCT: 46.7 % (ref 39.0–52.0)
Hemoglobin: 16.4 g/dL (ref 13.0–17.0)
MCH: 30.6 pg (ref 26.0–34.0)
MCHC: 35.1 g/dL (ref 30.0–36.0)
MCV: 87.1 fL (ref 80.0–100.0)
Platelets: 255 10*3/uL (ref 150–400)
RBC: 5.36 MIL/uL (ref 4.22–5.81)
RDW: 13.8 % (ref 11.5–15.5)
WBC: 8.2 10*3/uL (ref 4.0–10.5)
nRBC: 0 % (ref 0.0–0.2)

## 2021-08-02 LAB — BASIC METABOLIC PANEL
Anion gap: 8 (ref 5–15)
BUN: 18 mg/dL (ref 6–20)
CO2: 27 mmol/L (ref 22–32)
Calcium: 9.6 mg/dL (ref 8.9–10.3)
Chloride: 104 mmol/L (ref 98–111)
Creatinine, Ser: 0.88 mg/dL (ref 0.61–1.24)
GFR, Estimated: >60 mL/min (ref >60)
Glucose, Bld: 85 mg/dL (ref 70–99)
Potassium: 4 mmol/L (ref 3.5–5.1)
Sodium: 139 mmol/L (ref 135–145)

## 2021-08-02 LAB — GLUCOSE, CAPILLARY: Glucose-Capillary: 135 mg/dL — ABNORMAL HIGH (ref 70–99)

## 2021-08-02 NOTE — Progress Notes (Signed)
DUE TO COVID-19 ONLY ONE VISITOR IS ALLOWED TO COME WITH YOU AND STAY IN THE WAITING ROOM ONLY DURING PRE OP AND PROCEDURE DAY OF SURGERY.   PCP - [email or web address]@[facility] [place] Cardiologist - n/a  Chest x-ray - n/a EKG - 08/02/21 Stress Test - n/a ECHO - n/a Cardiac Cath - n/a  ICD Pacemaker/Loop - n/a  Sleep Study -  Yes CPAP - uses CPAP nightly  Do not take oral diabetes medicines (pills) the morning of surgery. (Actos and Xigduo XR).  Do not take Xigduo XR the day before surgery.  THE NIGHT BEFORE SURGERY, do not take bedtime dose of Novolog Insulin.      THE MORNING OF SURGERY, do not take Novolog Insulin unless your CBG is greater than 220 mg/dL.  If greater than 220 mg/dl, you may take  of your sliding scale (correction) dose of insulin..  The day of surgery, do not take other diabetes injectables, including Byetta (exenatide), Bydureon (exenatide ER), Victoza (liraglutide), or Trulicity (dulaglutide).  If your blood sugar is less than 70 mg/dL, you will need to treat for low blood sugar: Treat a low blood sugar (less than 70 mg/dL) with  cup of clear juice (cranberry or apple), 4 glucose tablets, OR glucose gel. Recheck blood sugar in 15 minutes after treatment (to make sure it is greater than 70 mg/dL). If your blood sugar is not greater than 70 mg/dL on recheck, call [phone] for further instructions.  ERAS: Clear liquids til 4:30 AM DOS.  Anesthesia review: Yes  STOP now taking any Aspirin (unless otherwise instructed by your surgeon), Aleve, Naproxen, Ibuprofen, Motrin, Advil, Goody's, BC's, all herbal medications, fish oil, and all vitamins.   Coronavirus Screening Covid test is n/a Ambulatory Surgery  Do you have any of the following symptoms:  Cough yes/no: No Fever (>100.51F)  yes/no: No Runny nose yes/no: No Sore throat yes/no: No Difficulty breathing/shortness of breath  yes/no: No  Have you traveled in the last 14 days and where? yes/no:  No  Patient verbalized understanding of instructions that were given to them at the PAT appointment. Patient was also instructed that they will need to review over the PAT instructions again at home before surgery.

## 2021-08-04 LAB — HEMOGLOBIN A1C
Hgb A1c MFr Bld: 7.7 % — ABNORMAL HIGH (ref 4.8–5.6)
Mean Plasma Glucose: 174 mg/dL

## 2021-08-06 ENCOUNTER — Encounter (HOSPITAL_COMMUNITY): Payer: Self-pay | Admitting: Orthopedic Surgery

## 2021-08-06 NOTE — Anesthesia Preprocedure Evaluation (Addendum)
Anesthesia Evaluation  Patient identified by MRN, date of birth, ID band Patient awake    Reviewed: Allergy & Precautions, NPO status , Patient's Chart, lab work & pertinent test results, reviewed documented beta blocker date and time   Airway Mallampati: II  TM Distance: >3 FB Neck ROM: Full    Dental no notable dental hx. (+) Teeth Intact, Dental Advisory Given   Pulmonary sleep apnea and Continuous Positive Airway Pressure Ventilation ,    Pulmonary exam normal breath sounds clear to auscultation       Cardiovascular hypertension, Pt. on medications Normal cardiovascular exam Rhythm:Regular Rate:Normal  EKG 08/02/21 NSR, normal EKG   Neuro/Psych negative neurological ROS  negative psych ROS   GI/Hepatic negative GI ROS, Neg liver ROS,   Endo/Other  diabetes, Well Controlled, Type 2, Insulin Dependent, Oral Hypoglycemic AgentsMorbid obesityHyperlipidemia  Renal/GU negative Renal ROS  negative genitourinary   Musculoskeletal Right shoulder rotator cuff arthropathy   Abdominal (+) + obese,   Peds  Hematology negative hematology ROS (+)   Anesthesia Other Findings   Reproductive/Obstetrics                            Anesthesia Physical Anesthesia Plan  ASA: 3  Anesthesia Plan: General   Post-op Pain Management: Regional block   Induction: Intravenous  PONV Risk Score and Plan: 3 and Treatment may vary due to age or medical condition and Ondansetron  Airway Management Planned: Oral ETT  Additional Equipment:   Intra-op Plan:   Post-operative Plan: Extubation in OR  Informed Consent: I have reviewed the patients History and Physical, chart, labs and discussed the procedure including the risks, benefits and alternatives for the proposed anesthesia with the patient or authorized representative who has indicated his/her understanding and acceptance.     Dental advisory given  Plan  Discussed with: CRNA and Anesthesiologist  Anesthesia Plan Comments:        Anesthesia Quick Evaluation

## 2021-08-07 ENCOUNTER — Ambulatory Visit (HOSPITAL_COMMUNITY)
Admission: RE | Admit: 2021-08-07 | Discharge: 2021-08-07 | Disposition: A | Discharge status: Home / Self Care | Payer: BC Managed Care – PPO | Attending: Orthopedic Surgery | Admitting: Orthopedic Surgery

## 2021-08-07 ENCOUNTER — Encounter (HOSPITAL_COMMUNITY)
Admission: RE | Disposition: A | Discharge status: Home / Self Care | Payer: Self-pay | Source: Home / Self Care | Attending: Orthopedic Surgery

## 2021-08-07 ENCOUNTER — Other Ambulatory Visit: Payer: Self-pay

## 2021-08-07 ENCOUNTER — Encounter (HOSPITAL_COMMUNITY): Payer: Self-pay | Admitting: Orthopedic Surgery

## 2021-08-07 ENCOUNTER — Ambulatory Visit (HOSPITAL_COMMUNITY): Payer: BC Managed Care – PPO | Admitting: Physician Assistant

## 2021-08-07 DIAGNOSIS — E119 Type 2 diabetes mellitus without complications: Secondary | ICD-10-CM | POA: Diagnosis not present

## 2021-08-07 DIAGNOSIS — I10 Essential (primary) hypertension: Secondary | ICD-10-CM | POA: Insufficient documentation

## 2021-08-07 DIAGNOSIS — E669 Obesity, unspecified: Secondary | ICD-10-CM | POA: Diagnosis not present

## 2021-08-07 DIAGNOSIS — Z794 Long term (current) use of insulin: Secondary | ICD-10-CM | POA: Insufficient documentation

## 2021-08-07 DIAGNOSIS — M19011 Primary osteoarthritis, right shoulder: Secondary | ICD-10-CM | POA: Insufficient documentation

## 2021-08-07 DIAGNOSIS — Z7984 Long term (current) use of oral hypoglycemic drugs: Secondary | ICD-10-CM | POA: Diagnosis not present

## 2021-08-07 DIAGNOSIS — G4730 Sleep apnea, unspecified: Secondary | ICD-10-CM | POA: Insufficient documentation

## 2021-08-07 DIAGNOSIS — M65811 Other synovitis and tenosynovitis, right shoulder: Secondary | ICD-10-CM | POA: Diagnosis not present

## 2021-08-07 DIAGNOSIS — M75121 Complete rotator cuff tear or rupture of right shoulder, not specified as traumatic: Secondary | ICD-10-CM

## 2021-08-07 DIAGNOSIS — Z01818 Encounter for other preprocedural examination: Secondary | ICD-10-CM

## 2021-08-07 HISTORY — PX: SHOULDER OPEN ROTATOR CUFF REPAIR: SHX2407

## 2021-08-07 LAB — GLUCOSE, CAPILLARY
Glucose-Capillary: 98 mg/dL (ref 70–99)
Glucose-Capillary: 155 mg/dL — ABNORMAL HIGH (ref 70–99)

## 2021-08-07 SURGERY — REPAIR, ROTATOR CUFF, OPEN
Anesthesia: General | Site: Shoulder | Laterality: Right

## 2021-08-07 MED ORDER — CEFAZOLIN IN SODIUM CHLORIDE 3-0.9 GM/100ML-% IV SOLN
3.0000 g | INTRAVENOUS | Status: AC
  Administered 2021-08-07: 3 g via INTRAVENOUS
  Filled 2021-08-07: qty 100

## 2021-08-07 MED ORDER — SUCCINYLCHOLINE CHLORIDE 200 MG/10ML IV SOSY
PREFILLED_SYRINGE | INTRAVENOUS | Status: DC | PRN
  Administered 2021-08-07: 140 mg via INTRAVENOUS

## 2021-08-07 MED ORDER — OXYCODONE HCL 5 MG/5ML PO SOLN: 5.0000 mg | Freq: Once | ORAL | Status: DC | PRN

## 2021-08-07 MED ORDER — PROPOFOL 10 MG/ML IV BOLUS
INTRAVENOUS | Status: DC | PRN
Start: 2021-08-07 — End: 2021-08-07
  Administered 2021-08-07: 200 mg via INTRAVENOUS

## 2021-08-07 MED ORDER — MIDAZOLAM HCL 2 MG/2ML IJ SOLN
INTRAMUSCULAR | Status: AC
  Filled 2021-08-07: qty 2

## 2021-08-07 MED ORDER — POVIDONE-IODINE 10 % EX SWAB
2.0000 "application " | Freq: Once | CUTANEOUS | Status: AC
  Administered 2021-08-07: 2 via TOPICAL

## 2021-08-07 MED ORDER — ORAL CARE MOUTH RINSE: 15.0000 mL | Freq: Once | OROMUCOSAL | Status: AC

## 2021-08-07 MED ORDER — BUPIVACAINE LIPOSOME 1.3 % IJ SUSP
INTRAMUSCULAR | Status: DC | PRN
  Administered 2021-08-07: 10 mL via PERINEURAL

## 2021-08-07 MED ORDER — DEXMEDETOMIDINE (PRECEDEX) IN NS 20 MCG/5ML (4 MCG/ML) IV SYRINGE
PREFILLED_SYRINGE | INTRAVENOUS | Status: DC | PRN
  Administered 2021-08-07 (×2): 8 ug via INTRAVENOUS

## 2021-08-07 MED ORDER — BUPIVACAINE HCL (PF) 0.5 % IJ SOLN
INTRAMUSCULAR | Status: DC | PRN
  Administered 2021-08-07: 20 mL via PERINEURAL

## 2021-08-07 MED ORDER — FENTANYL CITRATE (PF) 250 MCG/5ML IJ SOLN
INTRAMUSCULAR | Status: AC
  Filled 2021-08-07: qty 5

## 2021-08-07 MED ORDER — PROPOFOL 10 MG/ML IV BOLUS
INTRAVENOUS | Status: AC
  Filled 2021-08-07: qty 20

## 2021-08-07 MED ORDER — PHENYLEPHRINE 40 MCG/ML (10ML) SYRINGE FOR IV PUSH (FOR BLOOD PRESSURE SUPPORT)
PREFILLED_SYRINGE | INTRAVENOUS | Status: DC | PRN
  Administered 2021-08-07 (×2): 40 ug via INTRAVENOUS

## 2021-08-07 MED ORDER — VANCOMYCIN HCL 1000 MG IV SOLR
INTRAVENOUS | Status: DC | PRN
  Administered 2021-08-07: 1000 mg via TOPICAL

## 2021-08-07 MED ORDER — POVIDONE-IODINE 7.5 % EX SOLN
Freq: Once | CUTANEOUS | Status: DC
  Filled 2021-08-07: qty 118

## 2021-08-07 MED ORDER — SUGAMMADEX SODIUM 500 MG/5ML IV SOLN
INTRAVENOUS | Status: AC
  Filled 2021-08-07: qty 5

## 2021-08-07 MED ORDER — DEXAMETHASONE SODIUM PHOSPHATE 10 MG/ML IJ SOLN
INTRAMUSCULAR | Status: DC | PRN
Start: 2021-08-07 — End: 2021-08-07
  Administered 2021-08-07: 5 mg via INTRAVENOUS

## 2021-08-07 MED ORDER — ONDANSETRON HCL 4 MG/2ML IJ SOLN
INTRAMUSCULAR | Status: AC
  Filled 2021-08-07: qty 2

## 2021-08-07 MED ORDER — FENTANYL CITRATE (PF) 100 MCG/2ML IJ SOLN: 25.0000 ug | INTRAMUSCULAR | Status: DC | PRN

## 2021-08-07 MED ORDER — LACTATED RINGERS IV SOLN: INTRAVENOUS | Status: DC

## 2021-08-07 MED ORDER — DEXMEDETOMIDINE (PRECEDEX) IN NS 20 MCG/5ML (4 MCG/ML) IV SYRINGE
PREFILLED_SYRINGE | INTRAVENOUS | Status: AC
  Filled 2021-08-07: qty 5

## 2021-08-07 MED ORDER — SUCCINYLCHOLINE CHLORIDE 200 MG/10ML IV SOSY
PREFILLED_SYRINGE | INTRAVENOUS | Status: AC
  Filled 2021-08-07: qty 10

## 2021-08-07 MED ORDER — FENTANYL CITRATE (PF) 250 MCG/5ML IJ SOLN
INTRAMUSCULAR | Status: DC | PRN
  Administered 2021-08-07 (×2): 50 ug via INTRAVENOUS

## 2021-08-07 MED ORDER — PHENYLEPHRINE 40 MCG/ML (10ML) SYRINGE FOR IV PUSH (FOR BLOOD PRESSURE SUPPORT)
PREFILLED_SYRINGE | INTRAVENOUS | Status: AC
  Filled 2021-08-07: qty 10

## 2021-08-07 MED ORDER — SUGAMMADEX SODIUM 200 MG/2ML IV SOLN
INTRAVENOUS | Status: DC | PRN
  Administered 2021-08-07: 260 mg via INTRAVENOUS

## 2021-08-07 MED ORDER — SODIUM CHLORIDE 0.9 % IR SOLN
Status: DC | PRN
  Administered 2021-08-07: 3000 mL

## 2021-08-07 MED ORDER — CHLORHEXIDINE GLUCONATE 0.12 % MT SOLN
15.0000 mL | Freq: Once | OROMUCOSAL | Status: AC
  Administered 2021-08-07: 15 mL via OROMUCOSAL
  Filled 2021-08-07: qty 15

## 2021-08-07 MED ORDER — ONDANSETRON HCL 4 MG/2ML IJ SOLN
INTRAMUSCULAR | Status: DC | PRN
  Administered 2021-08-07: 4 mg via INTRAVENOUS

## 2021-08-07 MED ORDER — MIDAZOLAM HCL 5 MG/5ML IJ SOLN
INTRAMUSCULAR | Status: DC | PRN
  Administered 2021-08-07: 1 mg via INTRAVENOUS

## 2021-08-07 MED ORDER — VANCOMYCIN HCL 1000 MG IV SOLR
INTRAVENOUS | Status: AC
  Filled 2021-08-07: qty 20

## 2021-08-07 MED ORDER — METHOCARBAMOL 500 MG PO TABS: 500.0000 mg | ORAL_TABLET | Freq: Three times a day (TID) | ORAL | 0 refills | Status: DC | PRN

## 2021-08-07 MED ORDER — ONDANSETRON HCL 4 MG/2ML IJ SOLN: 4.0000 mg | Freq: Once | INTRAMUSCULAR | Status: DC | PRN

## 2021-08-07 MED ORDER — 0.9 % SODIUM CHLORIDE (POUR BTL) OPTIME
TOPICAL | Status: DC | PRN
  Administered 2021-08-07: 1000 mL

## 2021-08-07 MED ORDER — OXYCODONE-ACETAMINOPHEN 5-325 MG PO TABS: 1.0000 | ORAL_TABLET | ORAL | 0 refills | Status: AC | PRN

## 2021-08-07 MED ORDER — CEFAZOLIN SODIUM-DEXTROSE 2-4 GM/100ML-% IV SOLN: 2.0000 g | Freq: Once | INTRAVENOUS | Status: DC

## 2021-08-07 MED ORDER — PHENYLEPHRINE HCL-NACL 20-0.9 MG/250ML-% IV SOLN
INTRAVENOUS | Status: DC | PRN
  Administered 2021-08-07: 30 ug/min via INTRAVENOUS

## 2021-08-07 MED ORDER — ROCURONIUM BROMIDE 10 MG/ML (PF) SYRINGE
PREFILLED_SYRINGE | INTRAVENOUS | Status: DC | PRN
  Administered 2021-08-07 (×2): 10 mg via INTRAVENOUS
  Administered 2021-08-07: 50 mg via INTRAVENOUS

## 2021-08-07 MED ORDER — LIDOCAINE 2% (20 MG/ML) 5 ML SYRINGE
INTRAMUSCULAR | Status: AC
  Filled 2021-08-07: qty 5

## 2021-08-07 MED ORDER — OXYCODONE HCL 5 MG PO TABS: 5.0000 mg | ORAL_TABLET | Freq: Once | ORAL | Status: DC | PRN

## 2021-08-07 MED ORDER — ROCURONIUM BROMIDE 10 MG/ML (PF) SYRINGE
PREFILLED_SYRINGE | INTRAVENOUS | Status: AC
  Filled 2021-08-07: qty 10

## 2021-08-07 MED ORDER — DEXAMETHASONE SODIUM PHOSPHATE 10 MG/ML IJ SOLN
INTRAMUSCULAR | Status: AC
  Filled 2021-08-07: qty 1

## 2021-08-07 MED ORDER — CELECOXIB 100 MG PO CAPS: 100.0000 mg | ORAL_CAPSULE | Freq: Two times a day (BID) | ORAL | 0 refills | Status: AC

## 2021-08-07 SURGICAL SUPPLY — 68 items
ANCHOR FBRTK 2.6 SUTURETAP 1.3 (Anchor) ×2 IMPLANT
ANCHOR SWIVELOCK BIO 4.75X19.1 (Anchor) ×2 IMPLANT
BAG COUNTER SPONGE SURGICOUNT (BAG) ×2 IMPLANT
BENZOIN TINCTURE PRP APPL 2/3 (GAUZE/BANDAGES/DRESSINGS) ×1 IMPLANT
BLADE EXCALIBUR 4.0X13 (MISCELLANEOUS) ×1 IMPLANT
BLADE SURG 15 STRL LF DISP TIS (BLADE) IMPLANT
BLADE SURG 15 STRL SS (BLADE)
COVER SURGICAL LIGHT HANDLE (MISCELLANEOUS) ×2 IMPLANT
DECANTER SPIKE VIAL GLASS SM (MISCELLANEOUS) IMPLANT
DRAPE IMP U-DRAPE 54X76 (DRAPES) ×2 IMPLANT
DRAPE INCISE IOBAN 66X45 STRL (DRAPES) ×2 IMPLANT
DRAPE ORTHO SPLIT 77X108 STRL (DRAPES) ×2
DRAPE SURG ORHT 6 SPLT 77X108 (DRAPES) ×1 IMPLANT
DRAPE U-SHAPE 47X51 STRL (DRAPES) ×2 IMPLANT
DRSG PAD ABDOMINAL 8X10 ST (GAUZE/BANDAGES/DRESSINGS) ×1 IMPLANT
DRSG TEGADERM 4X4.75 (GAUZE/BANDAGES/DRESSINGS) ×4 IMPLANT
DURAPREP 26ML APPLICATOR (WOUND CARE) ×2 IMPLANT
DW OUTFLOW CASSETTE/TUBE SET (MISCELLANEOUS) ×1 IMPLANT
ELECT CAUTERY BLADE 6.4 (BLADE) ×2 IMPLANT
ELECT REM PT RETURN 9FT ADLT (ELECTROSURGICAL) ×2
ELECTRODE REM PT RTRN 9FT ADLT (ELECTROSURGICAL) ×1 IMPLANT
GAUZE SPONGE 4X4 12PLY STRL (GAUZE/BANDAGES/DRESSINGS) ×2 IMPLANT
GAUZE XEROFORM 1X8 LF (GAUZE/BANDAGES/DRESSINGS) ×2 IMPLANT
GLOVE SRG 8 PF TXTR STRL LF DI (GLOVE) ×1 IMPLANT
GLOVE SURG LTX SZ8 (GLOVE) ×2 IMPLANT
GLOVE SURG UNDER POLY LF SZ8 (GLOVE) ×2
GOWN STRL REUS W/ TWL LRG LVL3 (GOWN DISPOSABLE) ×2 IMPLANT
GOWN STRL REUS W/TWL LRG LVL3 (GOWN DISPOSABLE) ×4
IV NS IRRIG 3000ML ARTHROMATIC (IV SOLUTION) ×1 IMPLANT
KIT BASIN OR (CUSTOM PROCEDURE TRAY) ×2 IMPLANT
KIT TURNOVER KIT B (KITS) ×2 IMPLANT
MANIFOLD NEPTUNE II (INSTRUMENTS) ×2 IMPLANT
NDL 1/2 CIR CATGUT .05X1.09 (NEEDLE) IMPLANT
NDL HYPO 25GX1X1/2 BEV (NEEDLE) ×1 IMPLANT
NDL SCORPION MULTI FIRE (NEEDLE) IMPLANT
NEEDLE 1/2 CIR CATGUT .05X1.09 (NEEDLE) IMPLANT
NEEDLE HYPO 25GX1X1/2 BEV (NEEDLE) ×2 IMPLANT
NEEDLE SCORPION MULTI FIRE (NEEDLE) ×2 IMPLANT
PACK SHOULDER (CUSTOM PROCEDURE TRAY) ×2 IMPLANT
PACK UNIVERSAL I (CUSTOM PROCEDURE TRAY) ×2 IMPLANT
PAD ARMBOARD 7.5X6 YLW CONV (MISCELLANEOUS) ×4 IMPLANT
PASSER SUT SWANSON 36MM LOOP (INSTRUMENTS) IMPLANT
PROBE APOLLO 90XL (SURGICAL WAND) ×1 IMPLANT
SLING ARM IMMOBILIZER LRG (SOFTGOODS) ×1 IMPLANT
SPONGE T-LAP 4X18 ~~LOC~~+RFID (SPONGE) ×4 IMPLANT
STAPLER VISISTAT 35W (STAPLE) ×2 IMPLANT
STRIP CLOSURE SKIN 1/2X4 (GAUZE/BANDAGES/DRESSINGS) ×1 IMPLANT
SUCTION FRAZIER HANDLE 10FR (MISCELLANEOUS) ×2
SUCTION TUBE FRAZIER 10FR DISP (MISCELLANEOUS) ×1 IMPLANT
SUT 0 FIBERLOOP 38 BLUE TPR ND (SUTURE) ×6
SUT ETHILON 3 0 PS 1 (SUTURE) ×1 IMPLANT
SUT FIBERWIRE #2 38 T-5 BLUE (SUTURE) ×6
SUT MNCRL+ AB 3-0 CT1 36 (SUTURE) IMPLANT
SUT MONOCRYL AB 3-0 CT1 36IN (SUTURE) ×2
SUT VIC AB 0 CT1 27 (SUTURE) ×2
SUT VIC AB 0 CT1 27XBRD ANBCTR (SUTURE) ×1 IMPLANT
SUT VIC AB 1 CTX 27 (SUTURE) ×3 IMPLANT
SUT VIC AB 2-0 CT1 27 (SUTURE) ×2
SUT VIC AB 2-0 CT1 TAPERPNT 27 (SUTURE) ×1 IMPLANT
SUT VICRYL 0 UR6 27IN ABS (SUTURE) ×4 IMPLANT
SUTURE 0 FIBERLP 38 BLU TPR ND (SUTURE) IMPLANT
SUTURE FIBERWR #2 38 T-5 BLUE (SUTURE) ×3 IMPLANT
SYR CONTROL 10ML LL (SYRINGE) ×2 IMPLANT
TOWEL GREEN STERILE (TOWEL DISPOSABLE) ×2 IMPLANT
TOWEL GREEN STERILE FF (TOWEL DISPOSABLE) ×2 IMPLANT
TRAY FOLEY MTR SLVR 16FR STAT (SET/KITS/TRAYS/PACK) IMPLANT
TUBING ARTHROSCOPY IRRIG 16FT (MISCELLANEOUS) ×1 IMPLANT
WATER STERILE IRR 1000ML POUR (IV SOLUTION) ×2 IMPLANT

## 2021-08-07 NOTE — Anesthesia Procedure Notes (Signed)
Anesthesia Regional Block: Interscalene brachial plexus block   Pre-Anesthetic Checklist: , timeout performed,  Correct Patient, Correct Site, Correct Laterality,  Correct Procedure, Correct Position, site marked,  Risks and benefits discussed,  Surgical consent,  Pre-op evaluation,  At surgeon's request and post-op pain management  Laterality: Right  Prep: chloraprep       Needles:  Injection technique: Single-shot  Needle Type: Echogenic Stimulator Needle     Needle Length: 10cm  Needle Gauge: 21   Needle insertion depth: 8 cm   Additional Needles:   Procedures:,,,, ultrasound used (permanent image in chart),,   Motor weakness within 5 minutes.  Narrative:  Start time: 08/07/2021 7:12 AM End time: 08/07/2021 7:20 AM Injection made incrementally with aspirations every 5 mL.  Performed by: Personally  Anesthesiologist: Mal Amabile, MD  Additional Notes: Timeout performed. Patient sedated. Relevant anatomy ID'd using [place]. Incremental 2-34ml injection of LA with frequent aspiration. Patient tolerated procedure well.     Right Interscalene Block

## 2021-08-07 NOTE — H&P (Signed)
James [name] is an 56 y.o. male.   Chief Complaint: Right shoulder pain  HPI: Patient presents for evaluation of right shoulder pain.  Since he was last seen he had an MRI scan of the right shoulder which does show a full-thickness partial width tear of the anterior fibers of the supraspinatus.  Subscap intact infraspinatus intact.  Our biceps tenodesis has been performed.  He also has some deltoid streaking of uncertain significance.  Denies any new neck pain.  Symptoms are severe and interfering with his sleep.                                                   Past Medical History:  Diagnosis Date   ADHD (attention deficit hyperactivity disorder)    as a child, per patient "out grown it as an adult"   Chicken pox    Diabetes mellitus without complication (HCC)    type 2   History of kidney stones    surgery to remove   Hyperlipidemia    Hx:  no meds, diet controlled   Hypertension    Sleep apnea    CiPaP at night, every night    Past Surgical History:  Procedure Laterality Date   ANKLE ARTHROSCOPY Right 06/21/2017   Procedure: RIGHT ANKLE ARTHROSCOPY WITH DEBRIDEMENT;  Surgeon: Nadara Mustard, MD;  Location[facility];  Service: Orthopedics;  Laterality: Right;   CYSTOSCOPY W/ URETERAL STENT PLACEMENT  08/07/2011   Procedure: CYSTOSCOPY WITH RETROGRADE PYELOGRAM/URETERAL STENT PLACEMENT;  Surgeon: Ky Barban, MD;  Location: [facility];  Service: Urology;  Laterality: Left;  Cystoscopy, Left Retrograde Pyelogram, Left Ureteral Ballon Dilation, Double J Stent Placement   GLUTEUS MINIMUS REPAIR Right 07/28/2019   Procedure: RIGHT HIP TENDON TEAR REPAIR;  Surgeon: Cammy Copa, MD;  Location: [facility];  Service: Orthopedics;  Laterality: Right;   MENISCUS REPAIR     left knee 2018   SHOULDER SURGERY     bilateral    Family History  Problem Relation Age of Onset   Prostate cancer Father    Diabetes Father    Cancer Sister    Anesthesia problems Neg Hx     Hypotension Neg Hx    Malignant hyperthermia Neg Hx    Pseudochol deficiency Neg Hx    Colon cancer Neg Hx    Colon polyps Neg Hx    Esophageal cancer Neg Hx    Rectal cancer Neg Hx    Stomach cancer Neg Hx    Social History:  reports that he has never smoked. He has never used smokeless tobacco. He reports that he does not drink alcohol and does not use drugs.  Allergies:  Allergies  Allergen Reactions   Ceftriaxone Other (See Comments)    leads to increased photosensitivity, per hospital ([facility]) 02/08/2009    Medications Prior to Admission  Medication Sig Dispense Refill   Dapagliflozin-metFORMIN HCl ER (XIGDUO XR) 10-998 MG TB24 Take 2 tablets by mouth in the morning.     insulin aspart (NOVOLOG) 100 UNIT/ML injection Inject 10 Units into the skin 3 (three) times daily as needed for high blood sugar (if blood suger is above 250).     Multiple Vitamins-Minerals (MULTIVITAMIN WITH MINERALS) tablet Take 1 tablet by mouth daily.     OZEMPIC, 0.25 OR 0.5 MG/DOSE, 2 MG/1.5ML  SOPN Inject 0.5 mg into the skin once a week.     pioglitazone (ACTOS) 30 MG tablet Take 30 mg by mouth daily.     valsartan (DIOVAN) 320 MG tablet Take 320 mg by mouth daily.     ONETOUCH ULTRA test strip USE  STRIP TO CHECK GLUCOSE THREE TIMES DAILY 100 each 5   tadalafil (CIALIS) 5 MG tablet Take 5 mg by mouth daily as needed for erectile dysfunction.     testosterone cypionate (DEPOTESTOSTERONE CYPIONATE) 200 MG/ML injection SMARTSIG:Milliliter(s) IM      Results for orders placed or performed during the hospital encounter of 08/07/21 (from the past 48 hour(s))  Glucose, capillary     Status: None   Collection Time: 08/07/21  6:08 AM  Result Value Ref Range   Glucose-Capillary 98 70 - 99 mg/dL    Comment: Glucose reference range applies only to samples taken after fasting for at least 8 hours.   No results found.  Review of Systems  Musculoskeletal:  Positive for arthralgias.  All other systems  reviewed and are negative.  Blood pressure (!) 199/114, pulse 87, temperature 98.7 F (37.1 C), temperature source Oral, SpO2 100 %. Physical Exam Vitals reviewed.  HENT:     Head: Normocephalic.     Nose: Nose normal.  Eyes:     Pupils: Pupils are equal, round, and reactive to light.  Cardiovascular:     Rate and Rhythm: Normal rate.     Pulses: Normal pulses.  Pulmonary:     Effort: Pulmonary effort is normal.  Abdominal:     General: Abdomen is flat.  Musculoskeletal:     Cervical back: Normal range of motion.  Skin:    General: Skin is warm.     Capillary Refill: Capillary refill takes less than 2 seconds.  Neurological:     General: No focal deficit present.     Mental Status: He is alert.    Ortho exam demonstrates full active and passive range of motion of the elbow and wrist on the right-hand side.  Right shoulder range of motion is painful passively and actively.  There is no asymmetric loss of external rotation right versus left.  Both are about 45 degrees.  Neck range of motion is full.  No paresthesias C5-T1.  I do think it is possible that the amount of pain he is having could indicate early adhesive capsulitis which we can better assess with arthroscopic examination of the joint.  All questions answered about surgery. Assessment/Plan Impression is right shoulder rotator cuff tear.  Plan is right shoulder rotator cuff tear repair.  Risk and benefits are discussed with the patient male him to infection nerve vessel damage incomplete pain relief and incomplete restoration of function.  No radiculopathy or neck symptoms today.  Would use postop CPM brace.  All questions answered.  Anticipate 3 to 4 months to full recovery.  We should be able to discontinue the sling after 2 to 3 weeks based on size and retraction of the tear.  Does not have an obvious component of frozen shoulder at this time but that it is possible that he may have that as well based on the amount of pain he  is having.  No evidence of infection.  No evidence of radiculopathy or neck referred pain.  Burnard Bunting, MD 08/07/2021, 6:48 AM

## 2021-08-07 NOTE — Anesthesia Procedure Notes (Signed)
Procedure Name: Intubation Date/Time: 08/07/2021 7:46 AM Performed by: Maude Leriche, CRNA Pre-anesthesia Checklist: Patient identified, Emergency Drugs available, Suction available and Patient being monitored Patient Re-evaluated:Patient Re-evaluated prior to induction Oxygen Delivery Method: Circle system utilized Preoxygenation: Pre-oxygenation with 100% oxygen Induction Type: IV induction Ventilation: Mask ventilation without difficulty Laryngoscope Size: Miller and 2 Grade View: Grade II Tube type: Oral Tube size: 7.5 mm Number of attempts: 1 Airway Equipment and Method: Stylet and Oral airway Placement Confirmation: ETT inserted through vocal cords under direct vision, positive ETCO2 and breath sounds checked- equal and bilateral Secured at: 23 cm Tube secured with: Tape Dental Injury: Teeth and Oropharynx as per pre-operative assessment

## 2021-08-07 NOTE — Progress Notes (Signed)
Rechecked BP 187/103,pulse 87

## 2021-08-07 NOTE — Brief Op Note (Signed)
° °  08/07/2021  10:43 AM  PATIENT:  James [name]  56 y.o. male  PRE-OPERATIVE DIAGNOSIS:  right shoulder rotator cuff tear  POST-OPERATIVE DIAGNOSIS:  right shoulder rotator cuff tear, mild intra articular synovitis  PROCEDURE:  Procedure(s): right  shoulder arthroscopy, debridement, mini open rotator cuff tear repair  SURGEON:  Surgeon(s): Meredith Pel, MD  ASSISTANT: magnant pa  ANESTHESIA:   general  EBL: 15 ml    Total I/O In: 1000 [I.V.:1000] Out: 10 [Blood:10]  BLOOD ADMINISTERED: none  DRAINS: none   LOCAL MEDICATIONS USED:  none  SPECIMEN:  cxs x 2   COUNTS:  YES  TOURNIQUET:  * No tourniquets in log *  DICTATION: .Other Dictation: Dictation Number [id number]  PLAN OF CARE: Discharge to home after PACU  PATIENT DISPOSITION:  PACU - hemodynamically stable

## 2021-08-07 NOTE — Progress Notes (Signed)
Notified Dr. Casilda Carls of pt's BP 199/114,pulse 85-90. Patient took Diovan yesterday. BP taken with large cuff. Per Dr. Sampson Goon, Dr. Malen Gauze will see pt this am and determine treatment.

## 2021-08-07 NOTE — Anesthesia Postprocedure Evaluation (Signed)
Anesthesia Post Note  [name]  Procedure(s) Performed: right  shoulder arthroscopy, debridement, mini open rotator cuff tear repair (Right: Shoulder)     Patient location during evaluation: PACU Anesthesia Type: General Level of consciousness: awake and alert and oriented Pain management: pain level controlled Vital Signs Assessment: post-procedure vital signs reviewed and stable Respiratory status: spontaneous breathing, nonlabored ventilation and respiratory function stable Cardiovascular status: blood pressure returned to baseline and stable Postop Assessment: no apparent nausea or vomiting Anesthetic complications: no   No notable events documented.  Last Vitals:  Vitals:   08/07/21 1050 08/07/21 1105  BP: 125/79 134/85  Pulse: 78 75  Resp: 19 20  Temp:  37 C  SpO2: 92% 95%    Last Pain:  Vitals:   08/07/21 1105  TempSrc:   PainSc: 0-No pain                 Savior Himebaugh A.

## 2021-08-07 NOTE — Transfer of Care (Signed)
Immediate Anesthesia Transfer of Care Note  [name]  Procedure(s) Performed: right  shoulder arthroscopy, debridement, mini open rotator cuff tear repair (Right: Shoulder)  Patient Location: PACU  Anesthesia Type:GA combined with regional for post-op pain  Level of Consciousness: awake, alert  and oriented  Airway & Oxygen Therapy: Patient Spontanous Breathing and Patient connected to nasal cannula oxygen  Post-op Assessment: Report given to RN, Post -op Vital signs reviewed and stable and Patient able to stick tongue midline  Post vital signs: Reviewed  Last Vitals:  Vitals Value Taken Time  BP 141/82 08/07/21 1034  Temp 97.7   Pulse 80 08/07/21 1035  Resp 23 08/07/21 1035  SpO2 91 % 08/07/21 1035  Vitals shown include unvalidated device data.  Last Pain:  Vitals:   08/07/21 0613  TempSrc: Oral  PainSc: 6       Patients Stated Pain Goal: 5 (08/07/21 8588)  Complications: No notable events documented.

## 2021-08-07 NOTE — Op Note (Signed)
James [name], LITT MEDICAL RECORD NO: [id number] ACCOUNT NO: [id number] DATE OF BIRTH: [birth year] FACILITY: [facility] LOCATION: [facility] PHYSICIAN: Graylin Shiver. August Saucer, MD  Operative Report   DATE OF PROCEDURE: 08/07/2021  PREOPERATIVE DIAGNOSIS:  Right shoulder rotator cuff tear.  POSTOPERATIVE DIAGNOSIS:  Right shoulder synovitis with supraspinatus rotator cuff tear measuring about 2 x 1.5 cm U-shaped full thickness.  PROCEDURE:  Right shoulder arthroscopy with limited debridement and mini open rotator cuff tear repair.  SURGEON:  Graylin Shiver. August Saucer, MD.  ASSISTANT:  Karenann Cai, PA.  INDICATIONS:  This is a 56 year old patient with significant right shoulder pain.  MRI scan shows mild arthritis, which was very mild at the time of arthroscopy as well as full thickness rotator cuff tear of the supraspinatus, presents now for operative  management after explanation of risks and benefits.  DESCRIPTION OF PROCEDURE:  The patient was brought to the operating room where general endotracheal anesthesia was induced.  Preoperative antibiotics were administered.  Timeout was called.  The patient was placed in the beach chair position with the  head in neutral position.  Right shoulder, arm and hand prescrubbed with alcohol and Betadine, allowed to air dry, prepped with DuraPrep solution and draped in a sterile manner.  Ioban used to seal the operative field and cover the axilla.  A timeout was  called.  Posterior portal was created 2 cm medial and inferior to the posterolateral margin of the acromion.  Significant fluid was present within that glenohumeral joint.  Did not appear infected.  Appeared like a standard arthritic serous fluid.  This  was sent for culture x1.  Anterior portal created under direct visualization.  Diagnostic arthroscopy did demonstrate a full-thickness supraspinatus tear, which had a very thin film of articular-sided cuff and capsule attached, which prevented it from  being  removed, which essentially kept the excessive fluid within the shoulder joint.  Glenohumeral surfaces were inspected and found to be intact.  There was some mild synovitis within the rotator interval.  Next, the anterior inferior and posterior  inferior glenohumeral joints were intact.  No loose bodies in the axillary recess.  Next, the synovitis was cauterized using electrocautery.  A shaver was used to debride the rotator cuff tear.  Next, instruments were removed.  Portals were closed using  3-0 nylon.  Ioban then used to cover the entire operative field, prior incision from the biceps tenodesis was utilized.  Skin and subcutaneous tissue were sharply divided and extended proximally about a centimeter, distally about a centimeter.  Full  thickness skin flaps were elevated.  A #1 Vicryl suture placed between the anterior middle deltoid and the raphae between these two was developed.  Bursectomy performed.  There were some loose bodies within the bursal cavity overlying the torn rotator  cuff, which were also sent for culture.  Again, nothing looked infectious about this, but the amount of fluid present was slightly unusual.  Full-thickness cuff tear was then debrided sharply with devitalized appearing tissue removed both sharply as well  as with a rongeur.  A footprint was prepared using a rongeur and transverse swipes of the 15 blade.  Next, 3-0 FiberWire sutures were placed in inverted fashion to act as a margin conversion sutures.  These were not tied.  Two Arthrex suture anchors  with 4 suture tapes were then placed at the articular surface tuberosity interface.  These 8 suture limbs were then placed equidistant along the rotator cuff tendon of the anterior infraspinatus and all of  the supraspinatus.  Next, the convergence  sutures were tied.  Two 0 Vicryl Mason-Allen grasping sutures were placed in the edge of that cuff tendon just to pull that down as well.  Next, with the rotator cuff reduced, the  suture tapes were tied and crossed.  They were then added to the Vicryl  and 0 FiberWire sutures and they were then split and placed into two SwiveLocks to obtain a watertight repair.  A subacromial decompression with a rasp also performed.  The patient was taken through a range of motion, found to have excellent range of  motion.  Preoperative range of motion with examination under anesthesia demonstrated about 50 degrees of external rotation, 95 degrees of abduction and 165 of forward flexion.  After we secured the repair, sutures were cut.  Thorough irrigation was  performed.  Vancomycin powder was placed.  Deltoid split closed using #1 Vicryl suture.  More vancomycin powder placed above that closure and then the skin was closed using interrupted inverted 0 Vicryl suture, 2-0 Vicryl suture, and 3-0 Monocryl with  Steri-Strips and impervious dressings applied.  Shoulder immobilizer applied.  The patient tolerated the procedure well without immediate complications, was transferred to the recovery room in stable condition.  [name]'s assistance was required for  opening, closing, mobilization of tissue.  His assistance was a medical necessity.   CHR D: 08/07/2021 10:51:09 am T: 08/07/2021 8:49:00 pm  JOB: [id number]/ [id number]

## 2021-08-08 ENCOUNTER — Encounter (HOSPITAL_COMMUNITY): Payer: Self-pay | Admitting: Orthopedic Surgery

## 2021-08-12 LAB — AEROBIC/ANAEROBIC CULTURE W GRAM STAIN (SURGICAL/DEEP WOUND)
Culture: NO GROWTH
Culture: NO GROWTH

## 2021-08-16 ENCOUNTER — Other Ambulatory Visit: Payer: Self-pay

## 2021-08-16 ENCOUNTER — Ambulatory Visit (INDEPENDENT_AMBULATORY_CARE_PROVIDER_SITE_OTHER): Payer: BC Managed Care – PPO | Admitting: Orthopedic Surgery

## 2021-08-16 DIAGNOSIS — M75121 Complete rotator cuff tear or rupture of right shoulder, not specified as traumatic: Secondary | ICD-10-CM

## 2021-08-17 ENCOUNTER — Inpatient Hospital Stay: Payer: BC Managed Care – PPO | Attending: Oncology | Admitting: Licensed Clinical Social Worker

## 2021-08-17 ENCOUNTER — Encounter: Payer: Self-pay | Admitting: Licensed Clinical Social Worker

## 2021-08-17 ENCOUNTER — Encounter: Payer: Self-pay | Admitting: Orthopedic Surgery

## 2021-08-17 DIAGNOSIS — Z803 Family history of malignant neoplasm of breast: Secondary | ICD-10-CM

## 2021-08-17 DIAGNOSIS — Z8481 Family history of carrier of genetic disease: Secondary | ICD-10-CM | POA: Insufficient documentation

## 2021-08-17 NOTE — Progress Notes (Signed)
REFERRING PROVIDER: Self-referred  PRIMARY PROVIDER:  Dorothy Puffer, FNP  PRIMARY REASON FOR VISIT:  1. Family history of breast cancer gene mutation in first degree relative   2. Family history of breast cancer    I connected with James [name] on 08/17/2021 at 10:45 AM EDT by MyChart video conference and verified that I am speaking with the correct person using two identifiers.    Patient location: home Provider location: [facility]:   James [name], a 56 y.o. male, was seen for a [place] cancer genetics consultation due to his mother's recent genetic testing that revealed a BARD1 pathogenic variant.  James [name] presents to clinic today to discuss the possibility of a hereditary predisposition to cancer, genetic testing, and to further clarify his future cancer risks, as well as potential cancer risks for family members.   James [name] is a 56 y.o. male with no personal history of cancer.    Past Medical History:  Diagnosis Date   ADHD (attention deficit hyperactivity disorder)    as a child, per patient "out grown it as an adult"   Chicken pox    Diabetes mellitus without complication ([place])    type 2   Family history of breast cancer    Family history of breast cancer gene mutation in first degree relative    History of kidney stones    surgery to remove   Hyperlipidemia    Hx:  no meds, diet controlled   Hypertension    Sleep apnea    CiPaP at night, every night    Past Surgical History:  Procedure Laterality Date   ANKLE ARTHROSCOPY Right 06/21/2017   Procedure: RIGHT ANKLE ARTHROSCOPY WITH DEBRIDEMENT;  Surgeon: Newt Minion, MD;  Location: [place];  Service: Orthopedics;  Laterality: Right;   CYSTOSCOPY W/ URETERAL STENT PLACEMENT  08/07/2011   Procedure: CYSTOSCOPY WITH RETROGRADE PYELOGRAM/URETERAL STENT PLACEMENT;  Surgeon: Marissa Nestle, MD;  Location: [facility];  Service: Urology;  Laterality: Left;  Cystoscopy, Left  Retrograde Pyelogram, Left Ureteral Ballon Dilation, Double J Stent Placement   GLUTEUS MINIMUS REPAIR Right 07/28/2019   Procedure: RIGHT HIP TENDON TEAR REPAIR;  Surgeon: Meredith Pel, MD;  Location: [place];  Service: Orthopedics;  Laterality: Right;   MENISCUS REPAIR     left knee 2018   SHOULDER OPEN ROTATOR CUFF REPAIR Right 08/07/2021   Procedure: right  shoulder arthroscopy, debridement, mini open rotator cuff tear repair;  Surgeon: Meredith Pel, MD;  Location: [place];  Service: Orthopedics;  Laterality: Right;   SHOULDER SURGERY     bilateral    Social History   Socioeconomic History   Marital status: Married    Spouse name: Not on file   Number of children: 2   Years of education: 14   Highest education level: Not on file  Occupational History   Occupation: Therapist, occupational  Tobacco Use   Smoking status: Never   Smokeless tobacco: Never  Vaping Use   Vaping Use: Never used  Substance and Sexual Activity   Alcohol use: No   Drug use: No   Sexual activity: Yes  Other Topics Concern   Not on file  Social History Narrative   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    Social Determinants of Health   Financial Resource Strain: Not on file  Food Insecurity: Not on  file  Transportation Needs: Not on file  Physical Activity: Not on file  Stress: Not on file  Social Connections: Not on file     FAMILY HISTORY:  We obtained a detailed, 4-generation family history.  Significant diagnoses are listed below: Family History  Problem Relation Age of Onset   Breast cancer Mother 68       BARD1+   Prostate cancer Father    Diabetes Father    Cancer Sister        bile duct d. 97   Breast cancer Maternal Aunt        dx 82s   Multiple myeloma Maternal Grandmother        d. 60s   Breast cancer Maternal Great-grandmother        dx 57s   Anesthesia problems Neg Hx     Hypotension Neg Hx    Malignant hyperthermia Neg Hx    Pseudochol deficiency Neg Hx    Colon cancer Neg Hx    Colon polyps Neg Hx    Esophageal cancer Neg Hx    Rectal cancer Neg Hx    Stomach cancer Neg Hx    James [name] has 1 son (52) and 1 daughter (62), no history of cancer. He has 2 brothers, 2 sisters, 1 adopted sister. One of his sisters died of bile duct cancer at 83.  James [name] mother was recently diagnosed with breast cancer at 41 and underwent genetic testing that revealed a BARD1 pathogenic variant. Patient had 2 maternal aunts and 1 uncle. One aunt had breast cancer in her 80s. Maternal grandmother had multiple myeloma and died in her 7s, and her mother (patient's great grandmother) had breast cancer in her 50s.    James [name] father had prostate cancer in his 17s and is living at 42. Patient had 2 paternal uncles, no cancer aside from skin cancer. Paternal cousin has lung cancer. Paternal grandparents both passed over 63.    James [name] is aware of previous family history of genetic testing for hereditary cancer risks. Patient's maternal ancestors are of unknown descent, and paternal ancestors are of unknown descent. There is no reported Ashkenazi Jewish ancestry. There is no known consanguinity.  GENETIC COUNSELING ASSESSMENT: James [name] is a 56 y.o. male with a family history of a BARD1 mutation in his mother.  We, therefore, discussed and recommended the following at today's visit.   DISCUSSION: We discussed that approximately 10% of breast cancer is hereditary. We discussed the BARD1 gene in detail, noting cancer risks and potential management changes. We discussed that testing is beneficial for several reasons including knowing about cancer risks, identifying potential screening and risk-reduction options that may be appropriate, and to understand if other family members could be at risk for cancer and allow them to undergo genetic testing.    We reviewed the  characteristics, features and inheritance patterns of hereditary cancer syndromes. We also discussed genetic testing, including the appropriate family members to test, the process of testing, insurance coverage and turn-around-time for results. We discussed the implications of a negative, positive and/or variant of uncertain significant result. We recommended James [name] pursue genetic testing for the known familial mutation in [facility].    Based on James [name] family history of cancer, she meets medical criteria for genetic testing.This testing will be at no cost through [facility]'s family variant testing program.   PLAN: After considering the risks, benefits, and limitations, James [name] provided informed consent to pursue genetic testing. A saliva kit  was mailed to him and the sample will be sent to [facility] for analysis of the BARD1 gene. Results should be available within approximately 2-3 weeks' time, at which point they will be disclosed by telephone to James [name], as will any additional recommendations warranted by these results. James [name] will receive a summary of his genetic counseling visit and a copy of his results once available. This information will also be available in Epic.   James [name] questions were answered to his satisfaction today. Our contact information was provided should additional questions or concerns arise. Thank you for the referral and allowing [place] to share in the care of your patient.   Faith Rogue, MS, [facility] Genetic Counselor [email or web address] Phone: [phone]  The patient was seen for a total of 12 minutes in virtual genetic counseling.  Patient was seen alone. Dr. Grayland Ormond was available for discussion regarding this case.   _______________________________________________________________________ For Office Staff:  Number of people involved in session: 1 Was an Intern/ student involved with case: no

## 2021-08-17 NOTE — Progress Notes (Signed)
Post-Op Visit Note   [name]           Date of Birth: 09-Oct-1965           MRN: [id number] Visit Date: 08/16/2021 PCP: Dorothy Puffer, FNP   Assessment & Plan:  Chief Complaint:  Chief Complaint  Patient presents with   Right Shoulder - Routine Post Op   Visit Diagnoses:  1. Complete tear of right rotator cuff, unspecified whether traumatic     Plan: Duty as the patient is now about 9 days out right shoulder arthroscopy rotator cuff repair and debridement.  He is at 90 degrees on his black CPM brace.  On exam the incisions are intact.  Deltoid fires nicely.  He is okay with his current pain medicine.  Shoulder range of motion feels good with no grinding with passive range of motion.  Start physical therapy.  Op note provided.  4-week return for clinical recheck.  Would like him to discontinue the sling in 10 days.  No lifting with the right arm after that.  Follow-Up Instructions: No follow-ups on file.   Orders:  No orders of the defined types were placed in this encounter.  No orders of the defined types were placed in this encounter.   Imaging: No results found.  PMFS History: Patient Active Problem List   Diagnosis Date Noted   Type 2 diabetes mellitus without complication ([place]) [id number]   Osteochondritis dissecans of right talus    Essential hypertension, benign 06/08/2016   Routine general medical examination at a health care facility 04/12/2016   Morbid obesity ([place]) 04/12/2016   Rash and nonspecific skin eruption 03/23/2015   Obstructive apnea 04/21/2014   Past Medical History:  Diagnosis Date   ADHD (attention deficit hyperactivity disorder)    as a child, per patient "out grown it as an adult"   Chicken pox    Diabetes mellitus without complication ([place])    type 2   History of kidney stones    surgery to remove   Hyperlipidemia    Hx:  no meds, diet controlled   Hypertension    Sleep apnea    CiPaP at night, every night     Family History  Problem Relation Age of Onset   Prostate cancer Father    Diabetes Father    Cancer Sister    Anesthesia problems Neg Hx    Hypotension Neg Hx    Malignant hyperthermia Neg Hx    Pseudochol deficiency Neg Hx    Colon cancer Neg Hx    Colon polyps Neg Hx    Esophageal cancer Neg Hx    Rectal cancer Neg Hx    Stomach cancer Neg Hx     Past Surgical History:  Procedure Laterality Date   ANKLE ARTHROSCOPY Right 06/21/2017   Procedure: RIGHT ANKLE ARTHROSCOPY WITH DEBRIDEMENT;  Surgeon: Newt Minion, MD;  Location: [facility];  Service: Orthopedics;  Laterality: Right;   CYSTOSCOPY W/ URETERAL STENT PLACEMENT  08/07/2011   Procedure: CYSTOSCOPY WITH RETROGRADE PYELOGRAM/URETERAL STENT PLACEMENT;  Surgeon: Marissa Nestle, MD;  Location[facility];  Service: Urology;  Laterality: Left;  Cystoscopy, Left Retrograde Pyelogram, Left Ureteral Ballon Dilation, Double J Stent Placement   GLUTEUS MINIMUS REPAIR Right 07/28/2019   Procedure: RIGHT HIP TENDON TEAR REPAIR;  Surgeon: Meredith Pel, MD;  Location: [facility];  Service: Orthopedics;  Laterality: Right;   MENISCUS REPAIR     left knee 2018  SHOULDER OPEN ROTATOR CUFF REPAIR Right 08/07/2021   Procedure: right  shoulder arthroscopy, debridement, mini open rotator cuff tear repair;  Surgeon: Meredith Pel, MD;  Location: [facility];  Service: Orthopedics;  Laterality: Right;   SHOULDER SURGERY     bilateral   Social History   Occupational History   Occupation: Therapist, occupational  Tobacco Use   Smoking status: Never   Smokeless tobacco: Never  Vaping Use   Vaping Use: Never used  Substance and Sexual Activity   Alcohol use: No   Drug use: No   Sexual activity: Yes

## 2021-08-19 DIAGNOSIS — M65811 Other synovitis and tenosynovitis, right shoulder: Secondary | ICD-10-CM

## 2021-08-19 DIAGNOSIS — M75121 Complete rotator cuff tear or rupture of right shoulder, not specified as traumatic: Secondary | ICD-10-CM

## 2021-08-19 DIAGNOSIS — M65911 Unspecified synovitis and tenosynovitis, right shoulder: Secondary | ICD-10-CM

## 2021-09-05 ENCOUNTER — Telehealth: Payer: Self-pay | Admitting: Licensed Clinical Social Worker

## 2021-09-05 ENCOUNTER — Telehealth: Payer: Self-pay | Admitting: *Deleted

## 2021-09-05 NOTE — Telephone Encounter (Signed)
Left message for patient explaining that James [name] is unable to use the saliva sample he sent in as it was not labeled. Let him know we are sending another kit and that it is important to label it and follow other instructions.  ?

## 2021-09-05 NOTE — Telephone Encounter (Signed)
Patient requests that results be called to him again. ?

## 2021-09-13 ENCOUNTER — Other Ambulatory Visit: Payer: Self-pay

## 2021-09-13 ENCOUNTER — Encounter: Payer: Self-pay | Admitting: Orthopedic Surgery

## 2021-09-13 ENCOUNTER — Ambulatory Visit (INDEPENDENT_AMBULATORY_CARE_PROVIDER_SITE_OTHER): Payer: BC Managed Care – PPO | Admitting: Orthopedic Surgery

## 2021-09-13 DIAGNOSIS — M75121 Complete rotator cuff tear or rupture of right shoulder, not specified as traumatic: Secondary | ICD-10-CM

## 2021-09-13 NOTE — Progress Notes (Signed)
? ?Post-Op Visit Note ?  ?[name]           ?Date of Birth: [birth year]           ?MRN: [id number] ?Visit Date: 09/13/2021 ?PCP: Dorothy Puffer, FNP ? ? ?Assessment & Plan: ? ?Chief Complaint:  ?Chief Complaint  ?Patient presents with  ? Right Shoulder - Routine Post Op  ? ?Visit Diagnoses:  ?1. Complete tear of right rotator cuff, unspecified whether traumatic   ? ? ?Plan: Patient presents for follow-up of right shoulder rotator cuff tear repair done 08/07/2021.  Overall he is doing well.  Pain has improved.  He is in physical therapy 2 times a week.  Starting strengthening next week.  On examination he has range of motion of 45/90/160 passively.  He is having some left shoulder symptoms as well.  Examination of that shoulder demonstrates a little bit of crepitus which is slightly concerning for possible rotator cuff pathology.  Could be overuse on the left shoulder to compensate for the right.  Patient is out of his sling.  Plan is he is okay to return to work 327 regular duty with no restrictions.  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  6-week return with decision for or against MRI scanning on the left shoulder at that time. ? ?Follow-Up Instructions: Return in about 6 weeks (around 10/25/2021).  ? ?Orders:  ?No orders of the defined types were placed in this encounter. ? ?No orders of the defined types were placed in this encounter. ? ? ?Imaging: ?No results found. ? ?PMFS History: ?Patient Active Problem List  ? Diagnosis Date Noted  ? Synovitis of right shoulder   ? Complete tear of right rotator cuff   ? Family history of breast cancer 08/17/2021  ? Family history of breast cancer gene mutation in first degree relative 08/17/2021  ? Type 2 diabetes mellitus without complication ([place]) 09/32/3557  ? Osteochondritis dissecans of right talus   ? Essential hypertension, benign 06/08/2016  ? Routine general medical examination at a health care facility  04/12/2016  ? Morbid obesity ([place]) 04/12/2016  ? Rash and nonspecific skin eruption 03/23/2015  ? Obstructive apnea 04/21/2014  ? ?Past Medical History:  ?Diagnosis Date  ? ADHD (attention deficit hyperactivity disorder)   ? as a child, per patient "out grown it as an adult"  ? Chicken pox   ? Diabetes mellitus without complication ([place])   ? type 2  ? Family history of breast cancer   ? Family history of breast cancer gene mutation in first degree relative   ? History of kidney stones   ? surgery to remove  ? Hyperlipidemia   ? Hx:  no meds, diet controlled  ? Hypertension   ? Sleep apnea   ? CiPaP at night, every night  ?  ?Family History  ?Problem Relation Age of Onset  ? Breast cancer Mother 76  ?     BARD1+  ? Prostate cancer Father   ? Diabetes Father   ? Cancer Sister   ?     bile duct d. 67  ? Breast cancer Maternal Aunt   ?     dx 79s  ? Multiple myeloma Maternal Grandmother   ?     d. 43s  ? Breast cancer Maternal Great-grandmother   ?     dx 40s  ? Anesthesia problems Neg Hx   ? Hypotension Neg Hx   ? Malignant  hyperthermia Neg Hx   ? Pseudochol deficiency Neg Hx   ? Colon cancer Neg Hx   ? Colon polyps Neg Hx   ? Esophageal cancer Neg Hx   ? Rectal cancer Neg Hx   ? Stomach cancer Neg Hx   ?  ?Past Surgical History:  ?Procedure Laterality Date  ? ANKLE ARTHROSCOPY Right 06/21/2017  ? Procedure: RIGHT ANKLE ARTHROSCOPY WITH DEBRIDEMENT;  Surgeon: Newt Minion, MD;  Location: [facility];  Service: Orthopedics;  Laterality: Right;  ? CYSTOSCOPY W/ URETERAL STENT PLACEMENT  08/07/2011  ? Procedure: CYSTOSCOPY WITH RETROGRADE PYELOGRAM/URETERAL STENT PLACEMENT;  Surgeon: Marissa Nestle, MD;  [facility];  Service: Urology;  Laterality: Left;  Cystoscopy, Left Retrograde Pyelogram, Left Ureteral Ballon Dilation, Double J Stent Placement  ? GLUTEUS MINIMUS REPAIR Right 07/28/2019  ? Procedure: RIGHT HIP TENDON TEAR REPAIR;  Surgeon: Meredith Pel, MD;  Location: [facility];  Service:  Orthopedics;  Laterality: Right;  ? MENISCUS REPAIR    ? left knee 2018  ? SHOULDER OPEN ROTATOR CUFF REPAIR Right 08/07/2021  ? Procedure: right  shoulder arthroscopy, debridement, mini open rotator cuff tear repair;  Surgeon: Meredith Pel, MD;  Location: [facility];  Service: Orthopedics;  Laterality: Right;  ? SHOULDER SURGERY    ? bilateral  ? ?Social History  ? ?Occupational History  ? Occupation: Therapist, occupational  ?Tobacco Use  ? Smoking status: Never  ? Smokeless tobacco: Never  ?Vaping Use  ? Vaping Use: Never used  ?Substance and Sexual Activity  ? Alcohol use: No  ? Drug use: No  ? Sexual activity: Yes  ? ? ? ?

## 2021-09-19 ENCOUNTER — Encounter: Payer: Self-pay | Admitting: Orthopedic Surgery

## 2021-09-19 DIAGNOSIS — M75121 Complete rotator cuff tear or rupture of right shoulder, not specified as traumatic: Secondary | ICD-10-CM

## 2021-09-20 NOTE — Telephone Encounter (Signed)
Okay for more physical therapy right shoulder rotator cuff tear repair.  Okay to start strengthening at 6 weeks postop and continue with range of motion exercises.  2 times a week for 6 more weeks.  Thank you can you send in for rehab at [facility] rehab center thanks

## 2021-09-26 ENCOUNTER — Telehealth: Payer: Self-pay | Admitting: Licensed Clinical Social Worker

## 2021-09-26 NOTE — Telephone Encounter (Signed)
Revealed negative genetic testing for the known familial variant in BARD1.  ? ? ? ? ?

## 2021-09-26 NOTE — Telephone Encounter (Signed)
This has been refaxed 

## 2021-10-30 ENCOUNTER — Ambulatory Visit: Payer: BC Managed Care – PPO | Admitting: Orthopedic Surgery

## 2021-11-09 ENCOUNTER — Ambulatory Visit (INDEPENDENT_AMBULATORY_CARE_PROVIDER_SITE_OTHER): Payer: BC Managed Care – PPO | Admitting: Orthopedic Surgery

## 2021-11-09 DIAGNOSIS — M75121 Complete rotator cuff tear or rupture of right shoulder, not specified as traumatic: Secondary | ICD-10-CM

## 2021-11-10 ENCOUNTER — Encounter: Payer: Self-pay | Admitting: Orthopedic Surgery

## 2021-11-10 NOTE — Progress Notes (Signed)
? ?Post-Op Visit Note ?  ?[name]           ?Date of Birth: [birth year]           ?MRN: [id number] ?Visit Date: 11/09/2021 ?PCP: Dorothy Puffer, FNP ? ? ?Assessment & Plan: ? ?Chief Complaint:  ?Chief Complaint  ?Patient presents with  ? Right Shoulder - Routine Post Op  ?  right shoulder rotator cuff tear repair done 08/07/2021  ? ?Visit Diagnoses:  ?1. Complete tear of right rotator cuff, unspecified whether traumatic   ? ? ?Plan: Patient presents now 3 months out right shoulder rotator cuff tear repair.  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  Finished physical therapy last week.  His son is actually a therapist in [place] and has been helping him with exercises.  Plans to go camping in 2 weekends.  Not going to resume weightlifting until after the summer. ? ?On examination he has excellent range of motion passively of 50/90/150.  Rotator cuff strength is very good to infraspinatus supraspinatus subscap muscle testing.  No coarse grinding with internal and external rotation of the arm at 90 degrees of abduction.  At this time patient has done well with his rotator cuff tear repair.  We cautioned him against any type of push-up activity or lifting heavy objects out away from his body.  He will follow-up with [place] as needed. ? ?Follow-Up Instructions: Return if symptoms worsen or fail to improve.  ? ?Orders:  ?No orders of the defined types were placed in this encounter. ? ?No orders of the defined types were placed in this encounter. ? ? ?Imaging: ?No results found. ? ?PMFS History: ?Patient Active Problem List  ? Diagnosis Date Noted  ? Synovitis of right shoulder   ? Complete tear of right rotator cuff   ? Family history of breast cancer 08/17/2021  ? Family history of breast cancer gene mutation in first degree relative 08/17/2021  ? Type 2 diabetes mellitus without complication ([place]) 56/38/9373  ? Osteochondritis dissecans of right talus   ? Essential  hypertension, benign 06/08/2016  ? Routine general medical examination at a health care facility 04/12/2016  ? Morbid obesity ([place]) 04/12/2016  ? Rash and nonspecific skin eruption 03/23/2015  ? Obstructive apnea 04/21/2014  ? ?Past Medical History:  ?Diagnosis Date  ? ADHD (attention deficit hyperactivity disorder)   ? as a child, per patient "out grown it as an adult"  ? Chicken pox   ? Diabetes mellitus without complication ([place])   ? type 2  ? Family history of breast cancer   ? Family history of breast cancer gene mutation in first degree relative   ? History of kidney stones   ? surgery to remove  ? Hyperlipidemia   ? Hx:  no meds, diet controlled  ? Hypertension   ? Sleep apnea   ? CiPaP at night, every night  ?  ?Family History  ?Problem Relation Age of Onset  ? Breast cancer Mother 29  ?     BARD1+  ? Prostate cancer Father   ? Diabetes Father   ? Cancer Sister   ?     bile duct d. 71  ? Breast cancer Maternal Aunt   ?     dx 62s  ? Multiple myeloma Maternal Grandmother   ?     d. 26s  ? Breast cancer Maternal Great-grandmother   ?  dx 78s  ? Anesthesia problems Neg Hx   ? Hypotension Neg Hx   ? Malignant hyperthermia Neg Hx   ? Pseudochol deficiency Neg Hx   ? Colon cancer Neg Hx   ? Colon polyps Neg Hx   ? Esophageal cancer Neg Hx   ? Rectal cancer Neg Hx   ? Stomach cancer Neg Hx   ?  ?Past Surgical History:  ?Procedure Laterality Date  ? ANKLE ARTHROSCOPY Right 06/21/2017  ? Procedure: RIGHT ANKLE ARTHROSCOPY WITH DEBRIDEMENT;  Surgeon: Newt Minion, MD;  Location: [facility];  Service: Orthopedics;  Laterality: Right;  ? CYSTOSCOPY W/ URETERAL STENT PLACEMENT  08/07/2011  ? Procedure: CYSTOSCOPY WITH RETROGRADE PYELOGRAM/URETERAL STENT PLACEMENT;  Surgeon: Marissa Nestle, MD;  [facility];  Service: Urology;  Laterality: Left;  Cystoscopy, Left Retrograde Pyelogram, Left Ureteral Ballon Dilation, Double J Stent Placement  ? GLUTEUS MINIMUS REPAIR Right 07/28/2019  ? Procedure: RIGHT HIP TENDON TEAR  REPAIR;  Surgeon: Meredith Pel, MD;  Location: [facility];  Service: Orthopedics;  Laterality: Right;  ? MENISCUS REPAIR    ? left knee 2018  ? SHOULDER OPEN ROTATOR CUFF REPAIR Right 08/07/2021  ? Procedure: right  shoulder arthroscopy, debridement, mini open rotator cuff tear repair;  Surgeon: Meredith Pel, MD;  Location: [facility];  Service: Orthopedics;  Laterality: Right;  ? SHOULDER SURGERY    ? bilateral  ? ?Social History  ? ?Occupational History  ? Occupation: Therapist, occupational  ?Tobacco Use  ? Smoking status: Never  ? Smokeless tobacco: Never  ?Vaping Use  ? Vaping Use: Never used  ?Substance and Sexual Activity  ? Alcohol use: No  ? Drug use: No  ? Sexual activity: Yes  ? ? ? ?

## 2022-07-03 LAB — HM DIABETES EYE EXAM

## 2022-09-19 ENCOUNTER — Encounter: Payer: Self-pay | Admitting: Orthopedic Surgery

## 2022-09-19 NOTE — Telephone Encounter (Signed)
Next week is crazy busy so I'd say he can come in to see me this week or see either of [place] at next available

## 2022-09-21 ENCOUNTER — Other Ambulatory Visit (INDEPENDENT_AMBULATORY_CARE_PROVIDER_SITE_OTHER): Payer: BC Managed Care – PPO

## 2022-09-21 ENCOUNTER — Ambulatory Visit: Payer: BC Managed Care – PPO | Admitting: Surgical

## 2022-09-21 DIAGNOSIS — M25561 Pain in right knee: Secondary | ICD-10-CM

## 2022-09-21 DIAGNOSIS — M1711 Unilateral primary osteoarthritis, right knee: Secondary | ICD-10-CM | POA: Diagnosis not present

## 2022-09-22 ENCOUNTER — Encounter: Payer: Self-pay | Admitting: Surgical

## 2022-09-22 MED ORDER — LIDOCAINE HCL 1 % IJ SOLN
5.0000 mL | INTRAMUSCULAR | Status: AC | PRN
  Administered 2022-09-21: 5 mL

## 2022-09-22 MED ORDER — BUPIVACAINE HCL 0.25 % IJ SOLN
4.0000 mL | INTRAMUSCULAR | Status: AC | PRN
  Administered 2022-09-21: 4 mL via INTRA_ARTICULAR

## 2022-09-22 MED ORDER — METHYLPREDNISOLONE ACETATE 40 MG/ML IJ SUSP
40.0000 mg | INTRAMUSCULAR | Status: AC | PRN
  Administered 2022-09-21: 40 mg via INTRA_ARTICULAR

## 2022-09-22 NOTE — Progress Notes (Signed)
Office Visit Note   [name]           Date of Birth: 1966/04/13           MRN: [id number] Visit Date: 09/21/2022 Requested by: Dorothy Puffer, [place] [address] PCP: Dorothy Puffer, FNP  Subjective: Chief Complaint  Patient presents with   Right Knee - Pain    HPI: James [name] is a 57 y.o. male who presents to the office reporting right knee pain.  Patient states that he has had intermittent pain for years but the last 4 months has been constant and worsening.  He describes anterior medial pain without any radiation.  He has no groin pain or any new low back pain.  No history of recent injury to the right knee.  States that symptoms are worst in the morning when he gets up from bed and after he gets up initially from long periods of sitting.  States that the knee feels like it wants to buckle on him if he tries to get started walking too quickly from that initial position when he for stands up.  He needs to give this about 1 to 2 minutes after standing up to get adjusted and then he is able to walk.  As he walks, his pain progressively improves but never completely goes away.  He denies any prior fracture.  No history of prior surgery to the right knee.  He does note some mechanical popping in the knee but this is not new for him.  Up and down stairs does not bother him particularly compared with regular walking.  He is waking up with pain more and more recently.  Treatment wise, he has tried ice and heat as well as a knee massager and a knee brace without much lasting relief.  His right hip is doing well which he had surgery on years ago.              ROS: All systems reviewed are negative as they relate to the chief complaint within the history of present illness.  Patient denies fevers or chills.  Assessment & Plan: Visit Diagnoses:  1. Acute pain of right knee     Plan: Patient is a 57 year old male who  presents for evaluation of right knee pain.  Has had worsening pain over the last several months with subjective weakness to the knee where he feels it wants to give out on him if he tries to do too much activity before letting himself get set after initially standing up.  He has tenderness over the medial joint line as well as joint space narrowing in the medial compartment.  There is no large effusion and he has no specific injury incident that would be concerning for meniscal pathology.  Plan is aspiration and injection today.  3 cc was aspirated from the right knee prior to injection.  Tolerated procedure well.  He will follow-up with the office in 4 weeks for clinical recheck with Dr. Marlou Sa but if he is feeling back to 100% at that point, he may call and cancel his appointment.  Follow-Up Instructions: No follow-ups on file.   Orders:  Orders Placed This Encounter  Procedures   XR KNEE 3 VIEW RIGHT   No orders of the defined types were placed in this encounter.     Procedures: Large Joint Inj: R knee on 09/21/2022 11:29 AM Indications: diagnostic evaluation,  joint swelling and pain Details: 18 G 1.5 in needle, superolateral approach  Arthrogram: No  Medications: 5 mL lidocaine 1 %; 40 mg methylPREDNISolone acetate 40 MG/ML; 4 mL bupivacaine 0.25 % Aspirate: 3 mL Outcome: tolerated well, no immediate complications Procedure, treatment alternatives, risks and benefits explained, specific risks discussed. Consent was given by the patient. Immediately prior to procedure a time out was called to verify the correct patient, procedure, equipment, support staff and site/side marked as required. Patient was prepped and draped in the usual sterile fashion.       Clinical Data: No additional findings.  Objective: Vital Signs: There were no vitals taken for this visit.  Physical Exam:  Constitutional: Patient appears well-developed HEENT:  Head: Normocephalic Eyes:EOM are normal Neck:  Normal range of motion Cardiovascular: Normal rate Pulmonary/chest: Effort normal Neurologic: Patient is alert Skin: Skin is warm Psychiatric: Patient has normal mood and affect  Ortho Exam: Ortho exam demonstrates right knee with trace effusion.  Tenderness over the anterior medial joint line.  Mild tenderness over the lateral joint line.  No calf tenderness.  Negative Homans' sign.  No tenderness throughout the tibial shaft.  No pain with hip range of motion.  Negative FADIR sign.  Able to perform straight leg raise without extensor lag.  No instability to varus or valgus stress at 0 and 30 degrees.  Stable to anterior posterior drawer sign.  No cellulitis or skin changes noted.  Intact ankle dorsiflexion and plantarflexion.  He has 0 degrees extension and 115 degrees of knee flexion.  He does not have any pain with passive flexion of the knee.  No tenderness along the posterior aspect of the knee  Specialty Comments:  No specialty comments available.  Imaging: No results found.   PMFS History: Patient Active Problem List   Diagnosis Date Noted   Synovitis of right shoulder    Complete tear of right rotator cuff    Family history of breast cancer 08/17/2021   Family history of breast cancer gene mutation in first degree relative 08/17/2021   Type 2 diabetes mellitus without complication ([place]) [id number]   Osteochondritis dissecans of right talus    Essential hypertension, benign 06/08/2016   Routine general medical examination at a health care facility 04/12/2016   Morbid obesity ([place]) 04/12/2016   Rash and nonspecific skin eruption 03/23/2015   Obstructive apnea 04/21/2014   Past Medical History:  Diagnosis Date   ADHD (attention deficit hyperactivity disorder)    as a child, per patient "out grown it as an adult"   Chicken pox    Diabetes mellitus without complication ([place])    type 2   Family history of breast cancer    Family history of breast cancer gene mutation in first  degree relative    History of kidney stones    surgery to remove   Hyperlipidemia    Hx:  no meds, diet controlled   Hypertension    Sleep apnea    CiPaP at night, every night    Family History  Problem Relation Age of Onset   Breast cancer Mother 29       BARD1+   Prostate cancer Father    Diabetes Father    Cancer Sister        bile duct d. 42   Breast cancer Maternal Aunt        dx 93s   Multiple myeloma Maternal Grandmother        d. 66s   Breast cancer  Maternal Great-grandmother        dx 83s   Anesthesia problems Neg Hx    Hypotension Neg Hx    Malignant hyperthermia Neg Hx    Pseudochol deficiency Neg Hx    Colon cancer Neg Hx    Colon polyps Neg Hx    Esophageal cancer Neg Hx    Rectal cancer Neg Hx    Stomach cancer Neg Hx     Past Surgical History:  Procedure Laterality Date   ANKLE ARTHROSCOPY Right 06/21/2017   Procedure: RIGHT ANKLE ARTHROSCOPY WITH DEBRIDEMENT;  Surgeon: Newt Minion, MD;  Location: [facility];  Service: Orthopedics;  Laterality: Right;   CYSTOSCOPY W/ URETERAL STENT PLACEMENT  08/07/2011   Procedure: CYSTOSCOPY WITH RETROGRADE PYELOGRAM/URETERAL STENT PLACEMENT;  Surgeon: Marissa Nestle, MD;  [facility];  Service: Urology;  Laterality: Left;  Cystoscopy, Left Retrograde Pyelogram, Left Ureteral Ballon Dilation, Double J Stent Placement   GLUTEUS MINIMUS REPAIR Right 07/28/2019   Procedure: RIGHT HIP TENDON TEAR REPAIR;  Surgeon: Meredith Pel, MD;  Location: [facility];  Service: Orthopedics;  Laterality: Right;   MENISCUS REPAIR     left knee 2018   SHOULDER OPEN ROTATOR CUFF REPAIR Right 08/07/2021   Procedure: right  shoulder arthroscopy, debridement, mini open rotator cuff tear repair;  Surgeon: Meredith Pel, MD;  Location: [facility];  Service: Orthopedics;  Laterality: Right;   SHOULDER SURGERY     bilateral   Social History   Occupational History   Occupation: Therapist, occupational  Tobacco Use    Smoking status: Never   Smokeless tobacco: Never  Vaping Use   Vaping Use: Never used  Substance and Sexual Activity   Alcohol use: No   Drug use: No   Sexual activity: Yes

## 2022-09-29 ENCOUNTER — Other Ambulatory Visit: Payer: Self-pay

## 2022-09-29 ENCOUNTER — Emergency Department (HOSPITAL_COMMUNITY): Payer: BC Managed Care – PPO

## 2022-09-29 ENCOUNTER — Emergency Department (HOSPITAL_COMMUNITY)
Admission: EM | Admit: 2022-09-29 | Discharge: 2022-09-29 | Disposition: A | Discharge status: Home / Self Care | Payer: BC Managed Care – PPO | Attending: Emergency Medicine | Admitting: Emergency Medicine

## 2022-09-29 DIAGNOSIS — M5412 Radiculopathy, cervical region: Secondary | ICD-10-CM | POA: Diagnosis not present

## 2022-09-29 DIAGNOSIS — M25511 Pain in right shoulder: Secondary | ICD-10-CM | POA: Diagnosis present

## 2022-09-29 DIAGNOSIS — M9971 Connective tissue and disc stenosis of intervertebral foramina of cervical region: Secondary | ICD-10-CM | POA: Insufficient documentation

## 2022-09-29 MED ORDER — GABAPENTIN 100 MG PO CAPS
100.0000 mg | ORAL_CAPSULE | Freq: Once | ORAL | Status: AC
  Administered 2022-09-29: 100 mg via ORAL
  Filled 2022-09-29: qty 1

## 2022-09-29 MED ORDER — GABAPENTIN 100 MG PO CAPS: 100.0000 mg | ORAL_CAPSULE | Freq: Three times a day (TID) | ORAL | 0 refills | Status: DC

## 2022-09-29 MED ORDER — IBUPROFEN 800 MG PO TABS
800.0000 mg | ORAL_TABLET | Freq: Once | ORAL | Status: AC
  Administered 2022-09-29: 800 mg via ORAL
  Filled 2022-09-29: qty 1

## 2022-09-29 MED ORDER — HYDROCODONE-ACETAMINOPHEN 5-325 MG PO TABS
2.0000 | ORAL_TABLET | Freq: Once | ORAL | Status: AC
  Administered 2022-09-29: 2 via ORAL
  Filled 2022-09-29: qty 2

## 2022-09-29 NOTE — ED Triage Notes (Signed)
Patient c/o right arm/shoulder pain, onset Monday.. Patient states he has tried Tylenol at home and was seen yesterday at [facility] and received a Cortisone shot, without symptom relief. Patient denies acute injury.

## 2022-09-29 NOTE — Discharge Instructions (Signed)
Evaluation today revealed that you likely have cervical radiculopathy which is compression of the nerve root from the cervical spine extending down the right arm.  This is likely causing her symptoms today.  Recommend you follow-up with neurosurgery.  I provided their contact information in your discharge summary.  If you have new facial droop, slurred speech, new weakness or numbness in your extremities, changes in your gait or any other concerning symptom please return emergency department further evaluation.  I also started you on gabapentin which is a medication that is used to treat pain and discomfort related to radiculopathy.  I have sent that to her pharmacy.

## 2022-09-29 NOTE — ED Provider Notes (Signed)
[facility] Provider Note   CSN: [id number] Arrival date & time: 09/29/22  [id number]     History  Chief Complaint  Patient presents with  . Shoulder Pain   HPI James [name] is a 57 y.o. male with history of hypertension, hyperlipidemia and diabetes presenting for right shoulder pain.  Started on Monday.  Pain begins in the top of the shoulder joint and extends all the way down to the fingers.  Patient also endorses numbness in the right thumb and right pointer finger.  Denies facial droop, slurred speech, weakness in the extremities.  States range of motion and sensation is otherwise "normal".  Patient states that he has had 2 rotator cuff surgeries in the right shoulder.  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  Denies midline cervical spine tenderness.  Denies recent trauma to his shoulder or back.   Shoulder Pain      Home Medications Prior to Admission medications   Medication Sig Start Date End Date Taking? Authorizing Provider  gabapentin (NEURONTIN) 100 MG capsule Take 1 capsule (100 mg total) by mouth 3 (three) times daily. 09/29/22 10/29/22 Yes Harriet Pho, PA-C  Dapagliflozin-metFORMIN HCl ER (XIGDUO XR) 10-998 MG TB24 Take 2 tablets by mouth in the morning.    [provider]  insulin aspart (NOVOLOG) 100 UNIT/ML injection Inject 10 Units into the skin 3 (three) times daily as needed for high blood sugar (if blood suger is above 250).    [provider]  methocarbamol (ROBAXIN) 500 MG tablet Take 1 tablet (500 mg total) by mouth every 8 (eight) hours as needed. 08/07/21   Magnant, Gerrianne Scale, PA-C  Multiple Vitamins-Minerals (MULTIVITAMIN WITH MINERALS) tablet Take 1 tablet by mouth daily.    [provider]  [facility] ULTRA test strip USE  STRIP TO CHECK GLUCOSE THREE TIMES DAILY 07/19/19   Marrian Salvage, FNP  OZEMPIC, 0.25 OR 0.5 MG/DOSE, 2  MG/1.5ML SOPN Inject 0.5 mg into the skin once a week. 07/13/21   [provider]  pioglitazone (ACTOS) 30 MG tablet Take 30 mg by mouth daily. 07/13/21   [provider]  tadalafil (CIALIS) 5 MG tablet Take 5 mg by mouth daily as needed for erectile dysfunction. 05/16/21   [provider]  testosterone cypionate (DEPOTESTOSTERONE CYPIONATE) 200 MG/ML injection SMARTSIG:Milliliter(s) IM 05/16/21   [provider]  valsartan (DIOVAN) 320 MG tablet Take 320 mg by mouth daily. 07/13/21   [provider]      Allergies    Ceftriaxone    Review of Systems   See HPI for pertinent positves  Physical Exam Updated Vital Signs BP (!) 169/114 (BP Location: Left Arm) Comment: pt states that his bp usally has to be took manual because when it is took automatic the number is high  Pulse 86   Temp 97.9 F (36.6 C) (Oral)   Resp 17   Wt 113.4 kg   SpO2 100%   BMI 39.16 kg/m  Physical Exam Constitutional:      Appearance: Normal appearance.  HENT:     Head: Normocephalic.     Nose: Nose normal.  Eyes:     Conjunctiva/sclera: Conjunctivae normal.  Pulmonary:     Effort: Pulmonary effort is normal.  Musculoskeletal:     Right shoulder: Tenderness present. No swelling or deformity. Normal range of motion. Normal strength. Normal pulse.     Cervical  back: Normal. No swelling, deformity, tenderness or crepitus. Normal range of motion.     Comments: Strength 5/5 in upper extremities.  Intact sensation to light touch.  Radial pulse 2+ bilaterally.  Brisk cap refill.  Neurological:     Mental Status: He is alert.  Psychiatric:        Mood and Affect: Mood normal.     ED Results / Procedures / Treatments   Labs (all labs ordered are listed, but only abnormal results are displayed) Labs Reviewed - No data to display  EKG None  Radiology CT Cervical Spine Wo Contrast  Result Date: 09/29/2022 CLINICAL DATA:  Cervical radiculopathy, right shoulder  and arm pain beginning on Monday. Acute stroke suspected. No red flag symptoms. EXAM: CT HEAD WITHOUT CONTRAST CT CERVICAL SPINE WITHOUT CONTRAST TECHNIQUE: Multidetector CT imaging of the head and cervical spine was performed following the standard protocol without intravenous contrast. Multiplanar CT image reconstructions of the cervical spine were also generated. RADIATION DOSE REDUCTION: This exam was performed according to the departmental dose-optimization program which includes automated exposure control, adjustment of the mA and/or kV according to patient size and/or use of iterative reconstruction technique. COMPARISON:  None Available. FINDINGS: CT HEAD FINDINGS Brain: No evidence of acute infarction, hemorrhage, hydrocephalus, extra-axial collection or mass lesion/mass effect. Vascular: No hyperdense vessel or unexpected calcification. Skull: Normal. Negative for fracture or focal lesion. Sinuses/Orbits: No acute finding. CT CERVICAL SPINE FINDINGS Alignment: Straightening of cervical lordosis. Skull base and vertebrae: No acute fracture. No primary bone lesion or focal pathologic process. Soft tissues and spinal canal: No prevertebral fluid or swelling. No visible canal hematoma. Disc levels: Degenerative facet spurring asymmetric to the left at C2-3. Spondylitic spurring especially at C4-5 and C5-6. At C2-3 on the right there is notable uncovertebral spurring and moderate foraminal narrowing. Upper chest: Negative IMPRESSION: 1. Negative head CT. 2. Ordinary cervical spine degeneration. On the symptomatic right side there is C3-4 foraminal stenosis. Electronically Signed   By: Jorje Guild M.D.   On: 09/29/2022 09:48   CT Head Wo Contrast  Result Date: 09/29/2022 CLINICAL DATA:  Cervical radiculopathy, right shoulder and arm pain beginning on Monday. Acute stroke suspected. No red flag symptoms. EXAM: CT HEAD WITHOUT CONTRAST CT CERVICAL SPINE WITHOUT CONTRAST TECHNIQUE: Multidetector CT imaging  of the head and cervical spine was performed following the standard protocol without intravenous contrast. Multiplanar CT image reconstructions of the cervical spine were also generated. RADIATION DOSE REDUCTION: This exam was performed according to the departmental dose-optimization program which includes automated exposure control, adjustment of the mA and/or kV according to patient size and/or use of iterative reconstruction technique. COMPARISON:  None Available. FINDINGS: CT HEAD FINDINGS Brain: No evidence of acute infarction, hemorrhage, hydrocephalus, extra-axial collection or mass lesion/mass effect. Vascular: No hyperdense vessel or unexpected calcification. Skull: Normal. Negative for fracture or focal lesion. Sinuses/Orbits: No acute finding. CT CERVICAL SPINE FINDINGS Alignment: Straightening of cervical lordosis. Skull base and vertebrae: No acute fracture. No primary bone lesion or focal pathologic process. Soft tissues and spinal canal: No prevertebral fluid or swelling. No visible canal hematoma. Disc levels: Degenerative facet spurring asymmetric to the left at C2-3. Spondylitic spurring especially at C4-5 and C5-6. At C2-3 on the right there is notable uncovertebral spurring and moderate foraminal narrowing. Upper chest: Negative IMPRESSION: 1. Negative head CT. 2. Ordinary cervical spine degeneration. On the symptomatic right side there is C3-4 foraminal stenosis. Electronically Signed   By: Gilford Silvius.D.  On: 09/29/2022 09:48    Procedures Procedures    Medications Ordered in ED Medications  gabapentin (NEURONTIN) capsule 100 mg (has no administration in time range)  ibuprofen (ADVIL) tablet 800 mg (800 mg Oral Given 09/29/22 [id number])    ED Course/ Medical Decision Making/ A&P Clinical Course as of 09/29/22 1028  Sat Sep 29, 2022  1027 Patient requested pain medication at discharge.  Treated with Norco. [JR]    Clinical Course User Index [JR] Harriet Pho, PA-C                              Medical Decision Making Amount and/or Complexity of Data Reviewed Radiology: ordered.  Risk Prescription drug management.   57 year old well-appearing male presenting for right arm pain and numbness.  Exam is unremarkable.  DDx includes cervical radiculopathy, stroke, traumatic injury to the shoulder or arm.  I personally reviewed and interpreted CT scan which did reveal foraminal stenosis at the level C3-C4 on the right side.  This could be contributing to his symptoms.  Treated with ibuprofen and gabapentin.  Sent gabapentin to his pharmacy.  Symptoms are consistent with a cervical radiculopathy.  Discharged stable vitals.  Discussed return precautions.  Advised to follow-up with neurosurgery.  Symptoms not consistent with stroke.  No evidence or concern for traumatic injury at this time.        Final Clinical Impression(s) / ED Diagnoses Final diagnoses:  Cervical radiculopathy    Rx / DC Orders ED Discharge Orders          Ordered    gabapentin (NEURONTIN) 100 MG capsule  3 times daily        09/29/22 0954              Harriet Pho, PA-C 09/29/22 1001    Cristie Hem, MD 09/29/22 1326

## 2022-10-01 ENCOUNTER — Telehealth: Payer: Self-pay | Admitting: Orthopedic Surgery

## 2022-10-01 ENCOUNTER — Other Ambulatory Visit: Payer: Self-pay

## 2022-10-01 ENCOUNTER — Encounter: Payer: Self-pay | Admitting: Orthopedic Surgery

## 2022-10-01 DIAGNOSIS — M5412 Radiculopathy, cervical region: Secondary | ICD-10-CM

## 2022-10-01 MED ORDER — PREDNISONE 5 MG (21) PO TBPK: ORAL_TABLET | ORAL | 0 refills | Status: DC

## 2022-10-01 MED ORDER — ACETAMINOPHEN-CODEINE 300-30 MG PO TABS
1.0000 | ORAL_TABLET | Freq: Three times a day (TID) | ORAL | 0 refills | Status: DC | PRN
Start: 2022-10-01 — End: 2022-10-26

## 2022-10-01 MED ORDER — METHOCARBAMOL 500 MG PO TABS: 500.0000 mg | ORAL_TABLET | Freq: Three times a day (TID) | ORAL | 0 refills | Status: DC | PRN

## 2022-10-01 NOTE — Telephone Encounter (Signed)
Patient's wife James [name] called asked when will the medication be sent in for the patient. James [name] said she would like to pick up the medication today. (Steroid pack,muscle relaxer and Tylenol 3) James [name] said their pharmacy closes at 6:00pm. James [name] said Ander Purpura said patient was suppose to be referred to Dr. Ernestina Patches but will wait until they see how the medication does before scheduling an appointment. The number to contact James [name] is [phone]

## 2022-10-01 NOTE — Telephone Encounter (Signed)
IC, no answer. VM is full, unable to LM.  waiting for further advice from Dr Marlou Sa regarding patients mychart message. Steroid and muscle relaxer sent. Dr Marlou Sa will need to submit T#3 and I am currently waiting for him on this one since we are in clinic

## 2022-10-01 NOTE — Telephone Encounter (Signed)
Pt's wife Freda Munro returned call to [employer]. Please call back at [phone].

## 2022-10-01 NOTE — Telephone Encounter (Signed)
IC advised per previous note.

## 2022-10-02 NOTE — Telephone Encounter (Signed)
Take pain med q 4  Needs mri cspine also because this could be operative Pls clala thx

## 2022-10-03 ENCOUNTER — Telehealth: Payer: Self-pay | Admitting: Physical Medicine and Rehabilitation

## 2022-10-03 ENCOUNTER — Other Ambulatory Visit: Payer: Self-pay

## 2022-10-03 ENCOUNTER — Ambulatory Visit: Payer: BC Managed Care – PPO | Admitting: Physical Medicine and Rehabilitation

## 2022-10-03 VITALS — BP 158/104 | HR 86

## 2022-10-03 DIAGNOSIS — M5416 Radiculopathy, lumbar region: Secondary | ICD-10-CM

## 2022-10-03 DIAGNOSIS — M5412 Radiculopathy, cervical region: Secondary | ICD-10-CM

## 2022-10-03 MED ORDER — METHYLPREDNISOLONE ACETATE 80 MG/ML IJ SUSP
80.0000 mg | Freq: Once | INTRAMUSCULAR | Status: AC
  Administered 2022-10-03: 80 mg

## 2022-10-03 NOTE — Telephone Encounter (Signed)
FYI.Marland KitchenMarland KitchenMarland KitchenMarland KitchenMarland KitchenPatient wife came in to ask about injection and if it was approved I advised her we have no update in the chart as of yet and she would be able to call her insurance company and see if it was approved and get them to get back with [place] if so. Stated she will be calling back later to check stated she has been in contact with Rogelia Boga

## 2022-10-03 NOTE — Telephone Encounter (Signed)
Patient is scheduled for 10/03/22

## 2022-10-03 NOTE — Patient Instructions (Signed)

## 2022-10-03 NOTE — Telephone Encounter (Signed)
See previous encounter

## 2022-10-03 NOTE — Telephone Encounter (Signed)
Patient's wife Freda Munro called asked if the injection has been approved? Freda Munro said patient I suppose to be scheduled for an injection in his neck. The number to contact Freda Munro is [phone]

## 2022-10-03 NOTE — Progress Notes (Signed)
Functional Pain Scale - descriptive words and definitions  Unmanageable (7)  Pain interferes with normal ADL's/nothing seems to help/sleep is very difficult/active distractions are very difficult to concentrate on. Severe range order  Average Pain 6-7   +Driver, -BT, -Dye Allergies.   Neck pain on the right side that radiates into right arm, numbness in index and middle fingers

## 2022-10-08 ENCOUNTER — Encounter: Payer: Self-pay | Admitting: Physical Medicine and Rehabilitation

## 2022-10-08 ENCOUNTER — Other Ambulatory Visit: Payer: Self-pay | Admitting: Physical Medicine and Rehabilitation

## 2022-10-08 MED ORDER — PREGABALIN 75 MG PO CAPS: 75.0000 mg | ORAL_CAPSULE | Freq: Two times a day (BID) | ORAL | 0 refills | Status: DC

## 2022-10-08 MED ORDER — DIAZEPAM 5 MG PO TABS: ORAL_TABLET | ORAL | 0 refills | Status: DC

## 2022-10-08 MED ORDER — TRAMADOL HCL 50 MG PO TABS
50.0000 mg | ORAL_TABLET | Freq: Three times a day (TID) | ORAL | 0 refills | Status: DC | PRN
Start: 2022-10-08 — End: 2022-10-23

## 2022-10-08 NOTE — Progress Notes (Signed)
Short course of Tramadol and pre-procedure Valium called in.

## 2022-10-13 ENCOUNTER — Ambulatory Visit
Admission: RE | Admit: 2022-10-13 | Discharge: 2022-10-13 | Disposition: A | Discharge status: Home / Self Care | Payer: BC Managed Care – PPO | Source: Ambulatory Visit | Attending: Orthopedic Surgery | Admitting: Orthopedic Surgery

## 2022-10-13 DIAGNOSIS — M5412 Radiculopathy, cervical region: Secondary | ICD-10-CM | POA: Insufficient documentation

## 2022-10-15 ENCOUNTER — Telehealth: Payer: Self-pay | Admitting: Physical Medicine and Rehabilitation

## 2022-10-15 ENCOUNTER — Telehealth: Payer: Self-pay

## 2022-10-15 NOTE — Telephone Encounter (Signed)
-----   Message from Cammy Copa, MD sent at 10/15/2022 10:19 AM EDT ----- I called and left message on machine about his options which would be injection with [facility] or surgery with Dr. Christell Constant

## 2022-10-15 NOTE — Telephone Encounter (Signed)
Patient states he had an MRI this weekend and want to know what to do next. Please advise

## 2022-10-15 NOTE — Telephone Encounter (Signed)
See note below from Dr August Saucer

## 2022-10-15 NOTE — Procedures (Signed)
Cervical Epidural Steroid Injection - Interlaminar Approach with Fluoroscopic Guidance  [name]      Date of Birth: 08-04-1965 MRN: [id number] PCP: Veryl Speak, FNP      Visit Date: 10/03/2022   Universal Protocol:    Date/Time: 04/15/247:20 PM  Consent Given By: the patient  Position: PRONE  Additional Comments: Vital signs were monitored before and after the procedure. Patient was prepped and draped in the usual sterile fashion. The correct patient, procedure, and site was verified.   Injection Procedure Details:   Procedure diagnoses: Lumbar radiculopathy [M54.16]    Meds Administered:  Meds ordered this encounter  Medications   methylPREDNISolone acetate (DEPO-MEDROL) injection 80 mg     Laterality: Right  Location/Site: C7-T1  Needle: 3.5 in., 20 ga. Tuohy  Needle Placement: Paramedian epidural space  Findings:  -Comments:  There was good flow of contrast and good biplanar imaging but patient was very difficult in the sense that he could not keep his right arm comfortable and had to continuously move during the injection and it was more pain radiating into the right forearm with position.  Procedure Details: Using a paramedian approach from the side mentioned above, the region overlying the inferior lamina was localized under fluoroscopic visualization and the soft tissues overlying this structure were infiltrated with 4 ml. of 1% Lidocaine without Epinephrine. A # 20 gauge, Tuohy needle was inserted into the epidural space using a paramedian approach.  The epidural space was localized using loss of resistance along with contralateral oblique bi-planar fluoroscopic views.  After negative aspirate for air, blood, and CSF, a 2 ml. volume of Isovue-250 was injected into the epidural space and the flow of contrast was observed. Radiographs were obtained for documentation purposes.   The injectate was administered into the level noted  above.  Additional Comments:  No complications occurred Dressing: 2 x 2 sterile gauze and Band-Aid    Post-procedure details: Patient was observed during the procedure. Post-procedure instructions were reviewed.  Patient left the clinic in stable condition.

## 2022-10-15 NOTE — Telephone Encounter (Signed)
Hi Mick.  Would you mind looking at this patient's cervical spine MRI.  Having a lot of shoulder and arm pain but no weakness.  To me that disc looks bigger than what would be expected to be helped by an injection.  I am going to get him set up to see Dr. Alvester Morin for sometime this week.  His pain is not intractable but it is not really livable either at this point.  What are your thoughts?  If you think injections are futile then I will just have them see you first anyway.  Thanks

## 2022-10-15 NOTE — Telephone Encounter (Signed)
Pending mychart message response from Dr August Saucer

## 2022-10-15 NOTE — Progress Notes (Signed)
I called and left message on machine about his options which would be injection with [facility] or surgery with Dr. Christell Constant

## 2022-10-15 NOTE — Telephone Encounter (Signed)
That sounds like the best plan MICk.  He cannot lay flat when I talk to him tonight because it is too painful.  I think a transforaminal injection in that neck will be difficult if not impossible to do.Leotis Shames can you have him set up to see Dr. Christell Constant.  Reason being is that another injection of the type that he needs is not really feasible based on the symptoms he is having.  Thanks

## 2022-10-15 NOTE — Progress Notes (Signed)
James [name] - 57 y.o. male MRN [id number]  Date of birth: [birth year]  Office Visit Note: Visit Date: 10/03/2022 PCP: Veryl Speak, FNP Referred by: Veryl Speak, FNP  Subjective: Chief Complaint  Patient presents with   Neck - Pain   HPI:  James [name] is a 57 y.o. male who comes in today at the request of Dr. Burnard Bunting for planned Right C7-T1 Cervical Interlaminar epidural steroid injection with fluoroscopic guidance.  The patient has failed conservative care including home exercise, medications, time and activity modification.  This injection will be diagnostic and hopefully therapeutic.  Please see requesting physician notes for further details and justification.   ROS Otherwise per HPI.  Assessment & Plan: Visit Diagnoses:    ICD-10-CM   1. Lumbar radiculopathy  M54.16 XR C-ARM NO REPORT    Epidural Steroid injection    methylPREDNISolone acetate (DEPO-MEDROL) injection 80 mg      Plan: No additional findings.   Meds & Orders:  Meds ordered this encounter  Medications   methylPREDNISolone acetate (DEPO-MEDROL) injection 80 mg    Orders Placed This Encounter  Procedures   XR C-ARM NO REPORT   Epidural Steroid injection    Follow-up: Return for visit to requesting provider as needed.   Procedures: No procedures performed  Cervical Epidural Steroid Injection - Interlaminar Approach with Fluoroscopic Guidance  [name]      Date of Birth: [birth year] MRN: [id number] PCP: Veryl Speak, FNP      Visit Date: 10/03/2022   Universal Protocol:    Date/Time: 04/15/247:20 PM  Consent Given By: the patient  Position: PRONE  Additional Comments: Vital signs were monitored before and after the procedure. Patient was prepped and draped in the usual sterile fashion. The correct patient, procedure, and site was verified.   Injection Procedure Details:   Procedure diagnoses: Lumbar radiculopathy [M54.16]     Meds Administered:  Meds ordered this encounter  Medications   methylPREDNISolone acetate (DEPO-MEDROL) injection 80 mg     Laterality: Right  Location/Site: C7-T1  Needle: 3.5 in., 20 ga. Tuohy  Needle Placement: Paramedian epidural space  Findings:  -Comments:  There was good flow of contrast and good biplanar imaging but patient was very difficult in the sense that he could not keep his right arm comfortable and had to continuously move during the injection and it was more pain radiating into the right forearm with position.  Procedure Details: Using a paramedian approach from the side mentioned above, the region overlying the inferior lamina was localized under fluoroscopic visualization and the soft tissues overlying this structure were infiltrated with 4 ml. of 1% Lidocaine without Epinephrine. A # 20 gauge, Tuohy needle was inserted into the epidural space using a paramedian approach.  The epidural space was localized using loss of resistance along with contralateral oblique bi-planar fluoroscopic views.  After negative aspirate for air, blood, and CSF, a 2 ml. volume of Isovue-250 was injected into the epidural space and the flow of contrast was observed. Radiographs were obtained for documentation purposes.   The injectate was administered into the level noted above.  Additional Comments:  No complications occurred Dressing: 2 x 2 sterile gauze and Band-Aid    Post-procedure details: Patient was observed during the procedure. Post-procedure instructions were reviewed.  Patient left the clinic in stable condition.   Clinical History: CT CERVICAL SPINE WITHOUT CONTRAST   TECHNIQUE: Multidetector CT imaging of the head and  cervical spine was performed following the standard protocol without intravenous contrast. Multiplanar CT image reconstructions of the cervical spine were also generated.   RADIATION DOSE REDUCTION: This exam was performed according to  the departmental dose-optimization program which includes automated exposure control, adjustment of the mA and/or kV according to patient size and/or use of iterative reconstruction technique.   COMPARISON:  None Available.   FINDINGS: CT HEAD FINDINGS   Brain: No evidence of acute infarction, hemorrhage, hydrocephalus, extra-axial collection or mass lesion/mass effect.   Vascular: No hyperdense vessel or unexpected calcification.   Skull: Normal. Negative for fracture or focal lesion.   Sinuses/Orbits: No acute finding.   CT CERVICAL SPINE FINDINGS   Alignment: Straightening of cervical lordosis.   Skull base and vertebrae: No acute fracture. No primary bone lesion or focal pathologic process.   Soft tissues and spinal canal: No prevertebral fluid or swelling. No visible canal hematoma.   Disc levels: Degenerative facet spurring asymmetric to the left at C2-3. Spondylitic spurring especially at C4-5 and C5-6. At C2-3 on the right there is notable uncovertebral spurring and moderate foraminal narrowing.   Upper chest: Negative   IMPRESSION: 1. Negative head CT. 2. Ordinary cervical spine degeneration. On the symptomatic right side there is C3-4 foraminal stenosis.     Electronically Signed   By: Tiburcio Pea M.D.   On: 09/29/2022 09:48     Objective:  VS:  HT:    WT:   BMI:     BP:(!) 158/104  HR:86bpm  TEMP: ( )  RESP:  Physical Exam Vitals and nursing note reviewed.  Constitutional:      General: He is not in acute distress.    Appearance: Normal appearance. He is not ill-appearing.  HENT:     Head: Normocephalic and atraumatic.     Right Ear: External ear normal.     Left Ear: External ear normal.  Eyes:     Extraocular Movements: Extraocular movements intact.  Cardiovascular:     Rate and Rhythm: Normal rate.     Pulses: Normal pulses.  Abdominal:     General: There is no distension.     Palpations: Abdomen is soft.  Musculoskeletal:         General: No signs of injury.     Cervical back: Neck supple. Tenderness present. No rigidity.     Right lower leg: No edema.     Left lower leg: No edema.     Comments: Patient has good strength in the upper extremities with 5 out of 5 strength in wrist extension long finger flexion APB.  No intrinsic hand muscle atrophy.  Negative Hoffmann's test.  Lymphadenopathy:     Cervical: No cervical adenopathy.  Skin:    Findings: No erythema or rash.  Neurological:     General: No focal deficit present.     Mental Status: He is alert and oriented to person, place, and time.     Sensory: No sensory deficit.     Motor: No weakness or abnormal muscle tone.     Coordination: Coordination normal.  Psychiatric:        Mood and Affect: Mood normal.        Behavior: Behavior normal.      Imaging: No results found.

## 2022-10-16 NOTE — Telephone Encounter (Signed)
Thank you :)

## 2022-10-22 ENCOUNTER — Ambulatory Visit: Payer: BC Managed Care – PPO | Admitting: Surgical

## 2022-10-22 ENCOUNTER — Other Ambulatory Visit: Payer: BC Managed Care – PPO

## 2022-10-22 DIAGNOSIS — M1711 Unilateral primary osteoarthritis, right knee: Secondary | ICD-10-CM | POA: Diagnosis not present

## 2022-10-23 ENCOUNTER — Other Ambulatory Visit: Payer: Self-pay | Admitting: Surgical

## 2022-10-23 MED ORDER — TRAMADOL HCL 50 MG PO TABS: 50.0000 mg | ORAL_TABLET | Freq: Three times a day (TID) | ORAL | 0 refills | Status: DC | PRN

## 2022-10-24 ENCOUNTER — Encounter: Payer: Self-pay | Admitting: Surgical

## 2022-10-24 NOTE — Progress Notes (Signed)
Follow-up Office Visit Note   [name]           Date of Birth: 1965/11/05           MRN: [id number] Visit Date: 10/22/2022 Requested by: Melburn Popper, FNP [address] [place],  [place] [address] PCP: Veryl Speak, FNP  Subjective: Chief Complaint  Patient presents with   Right Knee - Follow-up    HPI: James [name] is a 57 y.o. male who returns to the office for follow-up visit.    Plan at last visit was: Patient is a 57 year old male who presents for evaluation of right knee pain.  Has had worsening pain over the last several months with subjective weakness to the knee where he feels it wants to give out on him if he tries to do too much activity before letting himself get set after initially standing up.  He has tenderness over the medial joint line as well as joint space narrowing in the medial compartment.  There is no large effusion and he has no specific injury incident that would be concerning for meniscal pathology.  Plan is aspiration and injection today.  3 cc was aspirated from the right knee prior to injection.  Tolerated procedure well.  He will follow-up with the office in 4 weeks for clinical recheck with Dr. August Saucer but if he is feeling back to 100% at that point, he may call and cancel his appointment.    Since then, patient notes he had right knee injection that gave him about 3 weeks of near 100% relief.  Unfortunately, the pain has come back and he is pretty much back to where he was at his last appointment as far as continual knee pain and difficulty with mobilization especially when he stands up.  Continues to feel like the knee wants to buckle on him.              ROS: All systems reviewed are negative as they relate to the chief complaint within the history of present illness.  Patient denies fevers or chills.  Assessment & Plan: Visit Diagnoses:  1. Unilateral primary osteoarthritis, right knee     Plan: James [name] [name] is a 57 y.o. male who returns to the office for follow-up visit for right knee pain.  Plan from last visit was noted above in HPI.  They now return with excellent relief of right knee pain from injection that was short-lived for only 3 weeks.  He is now back to prematurely was at his last visit.  He has radiographs demonstrated a little bit of medial compartment joint space narrowing but otherwise radiographs are unremarkable.  Need to determine the exact reason for his knee pain with MRI of the right knee.  MRI to evaluate for meniscal pathology versus occult arthritis that is worse than suggested on radiographs.  Follow-up after MRI to review results.  He is planning on seeing Dr. Christell Constant for evaluation of his neck and radicular pain on Friday.  Follow-Up Instructions: No follow-ups on file.   Orders:  Orders Placed This Encounter  Procedures   MR Knee Right w/o contrast   No orders of the defined types were placed in this encounter.     Procedures: No procedures performed   Clinical Data: No additional findings.  Objective: Vital Signs: There were no vitals taken for this visit.  Physical Exam:  Constitutional: Patient appears well-developed HEENT:  Head: Normocephalic  Eyes:EOM are normal Neck: Normal range of motion Cardiovascular: Normal rate Pulmonary/chest: Effort normal Neurologic: Patient is alert Skin: Skin is warm Psychiatric: Patient has normal mood and affect  Ortho Exam: Ortho exam demonstrates right knee with slightly larger effusion than was noted at last visit.  He has moderate medial joint line tenderness with no lateral joint line tenderness.  Range of motion from 0 degrees extension to 120 degrees knee flexion.  No calf tenderness.  Negative Homans' sign.  Able to perform straight leg raise with excellent quad strength graded 5/5.  No pain with hip range of motion.  No cellulitis or skin changes noted.  Specialty Comments:  MRI CERVICAL SPINE  WITHOUT CONTRAST   TECHNIQUE: Multiplanar, multisequence MR imaging of the cervical spine was performed. No intravenous contrast was administered.   COMPARISON:  CT cervical spine dated September 29, 2022.   FINDINGS: Alignment: Straightening of the normal cervical lordosis. No listhesis.   Vertebrae: No fracture, evidence of discitis, or bone lesion.   Cord: Normal signal and morphology.   Posterior Fossa, vertebral arteries, paraspinal tissues: Negative.   Disc levels:   C2-C3: Negative disc. Severe left facet arthropathy mild bilateral uncovertebral hypertrophy. Mild-to-moderate left neuroforaminal stenosis. No spinal canal or right neuroforaminal stenosis.   C3-C4: Mild disc bulging with moderate right and mild left facet uncovertebral hypertrophy. Slight flattening of the ventral cord. Mild spinal canal stenosis. Severe right neuroforaminal stenosis. No left neuroforaminal stenosis.   C4-C5: Negative disc. Mild bilateral uncovertebral hypertrophy. Mild-to-moderate right and mild left neuroforaminal stenosis. No spinal canal stenosis.   C5-C6: Tiny shallow central disc protrusion and mild bilateral uncovertebral hypertrophy. Moderate right neuroforaminal stenosis. No spinal canal or left neuroforaminal stenosis.   C6-C7: Mild disc bulging with superimposed large right foraminal disc protrusion. Severe right neuroforaminal stenosis and impingement of the exiting right C7 nerve root. Slight flattening of the far right ventral cord. No spinal canal or left neuroforaminal stenosis.   C7-T1:  Negative.   IMPRESSION: 1. Large right foraminal disc protrusion at C6-C7 resulting in severe right neuroforaminal stenosis and impingement of the exiting right C7 nerve root. 2. Severe right neuroforaminal stenosis at C3-C4. 3. Moderate right neuroforaminal stenosis at C5-C6.     Electronically Signed   By: Obie Dredge M.D.   On: 10/13/2022 17:38  Imaging: No results  found.   PMFS History: Patient Active Problem List   Diagnosis Date Noted   Synovitis of right shoulder    Complete tear of right rotator cuff    Family history of breast cancer 08/17/2021   Family history of breast cancer gene mutation in first degree relative 08/17/2021   Type 2 diabetes mellitus without complication 10/03/2018   Osteochondritis dissecans of right talus    Essential hypertension, benign 06/08/2016   Routine general medical examination at a health care facility 04/12/2016   Morbid obesity 04/12/2016   Rash and nonspecific skin eruption 03/23/2015   Obstructive apnea 04/21/2014   Past Medical History:  Diagnosis Date   ADHD (attention deficit hyperactivity disorder)    as a child, per patient "out grown it as an adult"   Chicken pox    Diabetes mellitus without complication (HCC)    type 2   Family history of breast cancer    Family history of breast cancer gene mutation in first degree relative    History of kidney stones    surgery to remove   Hyperlipidemia    Hx:  no meds, diet controlled  Hypertension    Sleep apnea    CiPaP at night, every night    Family History  Problem Relation Age of Onset   Breast cancer Mother 44       BARD1+   Prostate cancer Father    Diabetes Father    Cancer Sister        bile duct d. 62   Breast cancer Maternal Aunt        dx 28s   Multiple myeloma Maternal Grandmother        d. 1s   Breast cancer Maternal Great-grandmother        dx 65s   Anesthesia problems Neg Hx    Hypotension Neg Hx    Malignant hyperthermia Neg Hx    Pseudochol deficiency Neg Hx    Colon cancer Neg Hx    Colon polyps Neg Hx    Esophageal cancer Neg Hx    Rectal cancer Neg Hx    Stomach cancer Neg Hx     Past Surgical History:  Procedure Laterality Date   ANKLE ARTHROSCOPY Right 06/21/2017   Procedure: RIGHT ANKLE ARTHROSCOPY WITH DEBRIDEMENT;  Surgeon: Nadara Mustard, MD;  Location: [facility] OR;  Service: Orthopedics;  Laterality:  Right;   CYSTOSCOPY W/ URETERAL STENT PLACEMENT  08/07/2011   Procedure: CYSTOSCOPY WITH RETROGRADE PYELOGRAM/URETERAL STENT PLACEMENT;  Surgeon: Ky Barban, MD;  Location: [facility];  Service: Urology;  Laterality: Left;  Cystoscopy, Left Retrograde Pyelogram, Left Ureteral Ballon Dilation, Double J Stent Placement   GLUTEUS MINIMUS REPAIR Right 07/28/2019   Procedure: RIGHT HIP TENDON TEAR REPAIR;  Surgeon: Cammy Copa, MD;  Location: [facility];  Service: Orthopedics;  Laterality: Right;   MENISCUS REPAIR     left knee 2018   SHOULDER OPEN ROTATOR CUFF REPAIR Right 08/07/2021   Procedure: right  shoulder arthroscopy, debridement, mini open rotator cuff tear repair;  Surgeon: Cammy Copa, MD;  Location: [facility] OR;  Service: Orthopedics;  Laterality: Right;   SHOULDER SURGERY     bilateral   Social History   Occupational History   Occupation: Data processing manager  Tobacco Use   Smoking status: Never   Smokeless tobacco: Never  Vaping Use   Vaping Use: Never used  Substance and Sexual Activity   Alcohol use: No   Drug use: No   Sexual activity: Yes

## 2022-10-26 ENCOUNTER — Other Ambulatory Visit (INDEPENDENT_AMBULATORY_CARE_PROVIDER_SITE_OTHER): Payer: BC Managed Care – PPO

## 2022-10-26 ENCOUNTER — Encounter: Payer: Self-pay | Admitting: Orthopedic Surgery

## 2022-10-26 ENCOUNTER — Ambulatory Visit (INDEPENDENT_AMBULATORY_CARE_PROVIDER_SITE_OTHER): Payer: BC Managed Care – PPO | Admitting: Orthopedic Surgery

## 2022-10-26 VITALS — BP 177/122 | HR 90 | Wt 250.0 lb

## 2022-10-26 DIAGNOSIS — M5412 Radiculopathy, cervical region: Secondary | ICD-10-CM

## 2022-10-26 DIAGNOSIS — M5416 Radiculopathy, lumbar region: Secondary | ICD-10-CM

## 2022-10-26 MED ORDER — TRAMADOL HCL 50 MG PO TABS
50.0000 mg | ORAL_TABLET | Freq: Four times a day (QID) | ORAL | 0 refills | Status: AC | PRN
Start: 2022-10-26 — End: 2022-10-31

## 2022-10-26 NOTE — Progress Notes (Signed)
Orthopedic Spine Surgery Office Note  Assessment: Patient is a 57 y.o. male with right arm pain. Radiates along the lateral aspect of the arm into the dorsal forearm and index/long fingers. Has C6/7 foraminal stenosis on the right    Plan: -Explained that initially conservative treatment is tried as a significant number of patients may experience relief with these treatment modalities. Discussed that the conservative treatments include:  -activity modification  -physical therapy  -over the counter pain medications  -medrol dosepak  -cervical steroid injections -Patient has tried steroid injection, lyrica, activity modification, tramadol -Recommended PT. Prescription provided for tramadol -Patient should return to office in 4 weeks, x-rays at next visit: none   Patient expressed understanding of the plan and all questions were answered to the patient's satisfaction.   ___________________________________________________________________________   History:  Patient is a 57 y.o. male who presents today for cervical spine.  Patient has had 1 month of right arm pain.  It starts in the shoulder, radiates along the lateral aspect of the arm into the dorsal forearm.  The pain then radiates into the index and long finger on the right hand.  He has no pain radiating down the left upper extremity.  There is no trauma or injury that preceded the onset of pain.  He feels that his pain has gotten slightly better with time.  It is still significant and he still has issues with sleeping at night.  He will wake up with right arm pain. Pain is felt on a daily basis. He describes the pain as sharp and stabbing.  Besides laying down at night, he has not noticed anything that makes the pain better or worse.   Weakness: denies Difficulty with fine motor skills (e.g., buttoning shirts, handwriting): denies Symptoms of imbalance: denies Paresthesias and numbness: yes, in index and long finger on the right  side Bowel or bladder incontinence: denies Saddle anesthesia: denies  Treatments tried: steroid injection, lyrica, activity modification, tramadol  Review of systems: Denies fevers and chills, night sweats, unexplained weight loss, history of cancer. Has had pain that wakes him at night  Past medical history: HLD HTN OSA  Allergies: ceftriaxone  Past surgical history:  Right ankle arthroscopy Left ureter stent placement Right gluteus medius repair Right shoulder rotator cuff repair Left knee meniscus repair  Social history: Denies use of nicotine product (smoking, vaping, patches, smokeless) Alcohol use: denies Denies recreational drug use  Physical Exam:  BMI of 39.2  General: no acute distress, appears stated age Neurologic: alert, answering questions appropriately, following commands Respiratory: unlabored breathing on room air, symmetric chest rise Psychiatric: appropriate affect, normal cadence to speech   MSK (spine):  -Strength exam      Left  Right Grip strength                5/5  5/5 Interosseus   5/5   5/5 Wrist extension  5/5  5/5 Wrist flexion   5/5  5/5 Elbow flexion   5/5  5/5 Deltoid    5/5  5/5  EHL    5/5  5/5 TA    5/5  5/5 GSC    5/5  5/5 Knee extension  5/5  5/5 Hip flexion   5/5  5/5  -Sensory exam    Sensation intact to light touch in L3-S1 nerve distributions of bilateral lower extremities  Sensation intact to light touch in C5-T1 nerve distributions of bilateral upper extremities  -Brachioradialis DTR: 2/4 on the left, 2/4 on the right -  Biceps DTR: 2/4 on the left, 2/4 on the right -Triceps DTR: 2/4 on the left, 2/4 on the right -Achilles DTR: 2/4 on the left, 2/4 on the right -Patellar tendon DTR: 2/4 on the left, 2/4 on the right  -Spurling: positive on the right, negative on the left -Hoffman sign: negative bilaterally -Clonus: no beats bilaterally -Interosseous wasting: none seen -Grip and release test:  negative -Romberg: negative -Gait: normal  Left shoulder exam: no pain through range of motion Right shoulder exam: no pain through range of motion, negative jobe, no weakness with external rotation with arm at side, negative belly press  Tinel's at wrist: negative bilaterally Phalen's at wrist: negative bilaterally Durkan's: negative bilaterally  Tinel's at elbow: negative bilaterally  Imaging: XR of the cervical spine from 10/26/2022 was independently reviewed and interpreted, showing disc height loss at C/7. Anterior osteophyte formation at C4/5 and C5/6. No evidence of instability on flexion/extension views. Neutral alignment. No fracture or dislocation seen.   MRI of the cervical spine from 10/13/2022 was independently reviewed and interpreted, showing disc bulge with foraminal stenosis on the right at C3/4. Foraminal stenosis on the right at C4/5. Right-sided posterolateral disc herniation at C6/7 causing foraminal stenosis. No significant central stenosis. No T2 cord signal change.    Patient name: James [name] Patient MRN: [id number] Date of visit: 10/26/22

## 2022-10-30 ENCOUNTER — Encounter: Payer: Self-pay | Admitting: Orthopedic Surgery

## 2022-10-30 NOTE — Telephone Encounter (Signed)
Referral Notes Number of Notes: 2 . Type Date User Summary Attachment  General 10/30/2022 10:30 AM Samuella Cota, CMA - -  Note: Referral faxed to [phone] to [facility].

## 2022-10-31 ENCOUNTER — Telehealth: Payer: Self-pay | Admitting: Orthopedic Surgery

## 2022-10-31 NOTE — Telephone Encounter (Signed)
Patient wife called in stating the [facility] has not received the order, I verified the Fax number with her she will call them back to see if it is correct and let us know if they have received it or if we need to send it again

## 2022-10-31 NOTE — Telephone Encounter (Signed)
noted 

## 2022-11-10 ENCOUNTER — Ambulatory Visit
Admission: RE | Admit: 2022-11-10 | Discharge: 2022-11-10 | Disposition: A | Discharge status: Home / Self Care | Payer: BC Managed Care – PPO | Source: Ambulatory Visit | Attending: Surgical | Admitting: Surgical

## 2022-11-10 DIAGNOSIS — M1711 Unilateral primary osteoarthritis, right knee: Secondary | ICD-10-CM

## 2022-11-12 NOTE — Telephone Encounter (Signed)
I called.  He has medial meniscal tear which looks a little bigger than the 1 he had on his left knee back in 2019.  He has a wedding for someone next week and a grandchild on the way.  He is going to call if he wants to get set up for outpatient knee arthroscopy and meniscal debridement.

## 2022-11-14 NOTE — Progress Notes (Signed)
Dr August Saucer called and discussed mri results with [facility]

## 2022-11-28 ENCOUNTER — Ambulatory Visit: Payer: BC Managed Care – PPO | Admitting: Orthopedic Surgery

## 2022-11-29 ENCOUNTER — Ambulatory Visit: Payer: BC Managed Care – PPO | Admitting: Orthopedic Surgery

## 2022-12-13 ENCOUNTER — Ambulatory Visit: Payer: BC Managed Care – PPO | Admitting: Orthopedic Surgery

## 2022-12-13 ENCOUNTER — Encounter: Payer: Self-pay | Admitting: Orthopedic Surgery

## 2022-12-13 VITALS — BP 175/108 | Ht 67.0 in | Wt 249.0 lb

## 2022-12-13 DIAGNOSIS — M5412 Radiculopathy, cervical region: Secondary | ICD-10-CM

## 2022-12-13 NOTE — Progress Notes (Signed)
Orthopedic Spine Surgery Office Note   Assessment: Patient is a 57 y.o. male with right arm pain. Radiates along the lateral aspect of the arm into the dorsal forearm and index/long fingers. Has C6/7 foraminal stenosis on the right      Plan: -Explained that initially conservative treatment is tried as a significant number of patients may experience relief with these treatment modalities. Discussed that the conservative treatments include:             -activity modification             -physical therapy             -over the counter pain medications             -medrol dosepak             -cervical steroid injections -Patient has tried steroid injection, lyrica, activity modification, narcotics -Pain has improved significantly, so no further intervention recommended at this time -Patient should return to office on an as-needed basis     Patient expressed understanding of the plan and all questions were answered to the patient's satisfaction.    ___________________________________________________________________________     History:   Patient is a 57 y.o. male who presents today for follow-up on his cervical spine.  Patient had been previously seen for pain that started in his shoulder and radiate down the lateral aspect of his arm into the dorsal forearm.  After our last visit, I had been prescribing him narcotics to help with the pain.  His pain has significantly decreased with time.  He has not been taking any medications for the last 3 weeks.  He said he will sometimes still feels sore when he sleeping at night but is not having any pain during the day.  He said his symptoms are 99% better.  He still has decreased sensation in his index and long finger on the right side.  No other numbness or paresthesias.    Treatments tried: steroid injection, lyrica, activity modification, narcotics   Physical Exam:  General: no acute distress, appears stated age Neurologic: alert, answering  questions appropriately, following commands Respiratory: unlabored breathing on room air, symmetric chest rise Psychiatric: appropriate affect, normal cadence to speech     MSK (spine):   -Strength exam                                                   Left                  Right Grip strength                5/5                  5/5 Interosseus                  5/5                  5/5 Wrist extension            5/5                  5/5 Wrist flexion                 5/5  5/5 Elbow flexion                5/5                  5/5 Deltoid                          5/5                  5/5    -Sensory exam                           Sensation intact to light touch in C5-T1 nerve distributions of bilateral upper extremities   -Brachioradialis DTR: 2/4 on the left, 2/4 on the right -Biceps DTR: 2/4 on the left, 2/4 on the right   -Spurling: negative bilaterally -Hoffman sign: negative bilaterally -Clonus: no beats bilaterally -Interosseous wasting: none seen -Grip and release test: negative -Gait: normal   Left shoulder exam: no pain through range of motion Right shoulder exam: no pain through range of motion, negative jobe, no weakness with external rotation with arm at side, negative belly press   Tinel's at wrist: negative bilaterally Phalen's at wrist: negative bilaterally Durkan's: negative bilaterally   Tinel's at elbow: negative bilaterally   Imaging: XR of the cervical spine from 10/26/2022 was previously independently reviewed and interpreted, showing disc height loss at C/7. Anterior osteophyte formation at C4/5 and C5/6. No evidence of instability on flexion/extension views. Neutral alignment. No fracture or dislocation seen.    MRI of the cervical spine from 10/13/2022 was previously independently reviewed and interpreted, showing disc bulge with foraminal stenosis on the right at C3/4. Foraminal stenosis on the right at C4/5. Right-sided posterolateral  disc herniation at C6/7 causing foraminal stenosis. No significant central stenosis. No T2 cord signal change.      Patient name: James [name] Patient MRN: [id number] Date of visit: 12/13/22

## 2023-01-31 ENCOUNTER — Ambulatory Visit: Payer: BC Managed Care – PPO | Admitting: Internal Medicine

## 2023-01-31 ENCOUNTER — Encounter: Payer: Self-pay | Admitting: Internal Medicine

## 2023-01-31 VITALS — BP 150/86 | HR 84 | Ht 67.0 in | Wt 252.8 lb

## 2023-01-31 DIAGNOSIS — Z1159 Encounter for screening for other viral diseases: Secondary | ICD-10-CM

## 2023-01-31 DIAGNOSIS — I10 Essential (primary) hypertension: Secondary | ICD-10-CM | POA: Diagnosis not present

## 2023-01-31 DIAGNOSIS — Z7984 Long term (current) use of oral hypoglycemic drugs: Secondary | ICD-10-CM

## 2023-01-31 DIAGNOSIS — Z1211 Encounter for screening for malignant neoplasm of colon: Secondary | ICD-10-CM | POA: Insufficient documentation

## 2023-01-31 DIAGNOSIS — G4733 Obstructive sleep apnea (adult) (pediatric): Secondary | ICD-10-CM

## 2023-01-31 DIAGNOSIS — E119 Type 2 diabetes mellitus without complications: Secondary | ICD-10-CM | POA: Diagnosis not present

## 2023-01-31 DIAGNOSIS — Z1321 Encounter for screening for nutritional disorder: Secondary | ICD-10-CM

## 2023-01-31 DIAGNOSIS — E669 Obesity, unspecified: Secondary | ICD-10-CM

## 2023-01-31 DIAGNOSIS — Z114 Encounter for screening for human immunodeficiency virus [HIV]: Secondary | ICD-10-CM

## 2023-01-31 DIAGNOSIS — Z8639 Personal history of other endocrine, nutritional and metabolic disease: Secondary | ICD-10-CM | POA: Insufficient documentation

## 2023-01-31 DIAGNOSIS — Z1329 Encounter for screening for other suspected endocrine disorder: Secondary | ICD-10-CM

## 2023-01-31 NOTE — Assessment & Plan Note (Signed)
He endorses a history of essential hypertension.  Currently prescribed valsartan 320 mg daily.  His blood pressure is elevated today, 166/96 initially and 150/80 on repeat.  He regularly checks his blood pressure at home and states that readings are typically 130 over 80s. -No medication changes today.  Follow-up in 4 weeks for HTN check.

## 2023-01-31 NOTE — Assessment & Plan Note (Signed)
He endorses nightly compliance with CPAP.  Followed by pulmonology in [place].

## 2023-01-31 NOTE — Assessment & Plan Note (Signed)
Previously on TRT but has been off of therapy for at least 6 months.  This was managed by his PCP.  He is not clear if he should resume therapy or not, but would like to have his testosterone level checked. -AM free and total testosterone levels ordered today

## 2023-01-31 NOTE — Progress Notes (Signed)
New Patient Office Visit  Subjective    Patient ID: James [name], male    DOB: 02-04-1966  Age: 57 y.o. MRN: [id number]  CC:  Chief Complaint  Patient presents with   Establish Care    HPI James [name] presents to establish care.  He is a 57 year old male who endorses a past medical history significant for T2DM, HTN, OSA, and hypogonadism.  Previously followed by primary care in [place], [place].  James [name] reports feeling fairly well today.  His acute concerns are wanting to improve his A1c and also improve his energy level.  He currently works as a Games developer at [employer].  He denies a history of tobacco, alcohol, and illicit drug use.  His feeling medical history is significant for breast cancer in his mother and liver cancer in his sister.  Acute concerns, chronic medical conditions, and outstanding preventative care items discussed today are individually addressed A/P below.   Outpatient Encounter Medications as of 01/31/2023  Medication Sig   Dapagliflozin-metFORMIN HCl ER (XIGDUO XR) 10-998 MG TB24 Take 2 tablets by mouth in the morning.   Multiple Vitamins-Minerals (MULTIVITAMIN WITH MINERALS) tablet Take 1 tablet by mouth daily.   ONETOUCH ULTRA test strip USE  STRIP TO CHECK GLUCOSE THREE TIMES DAILY   pioglitazone (ACTOS) 30 MG tablet Take 30 mg by mouth daily.   valsartan (DIOVAN) 320 MG tablet Take 320 mg by mouth daily.   insulin aspart (NOVOLOG) 100 UNIT/ML injection Inject 10 Units into the skin 3 (three) times daily as needed for high blood sugar (if blood suger is above 250). (Patient not taking: Reported on 01/31/2023)   OZEMPIC, 0.25 OR 0.5 MG/DOSE, 2 MG/1.5ML SOPN Inject 0.5 mg into the skin once a week. (Patient not taking: Reported on 01/31/2023)   testosterone cypionate (DEPOTESTOSTERONE CYPIONATE) 200 MG/ML injection SMARTSIG:Milliliter(s) IM (Patient not taking: Reported on 01/31/2023)   [DISCONTINUED] diazepam (VALIUM) 5 MG tablet Take one tablet  by mouth with food one hour prior to procedure. May repeat 30 minutes prior if needed.   [DISCONTINUED] gabapentin (NEURONTIN) 100 MG capsule Take 1 capsule (100 mg total) by mouth 3 (three) times daily.   [DISCONTINUED] methocarbamol (ROBAXIN) 500 MG tablet Take 1 tablet (500 mg total) by mouth every 8 (eight) hours as needed.   [DISCONTINUED] methocarbamol (ROBAXIN) 500 MG tablet Take 1 tablet (500 mg total) by mouth every 8 (eight) hours as needed for muscle spasms.   [DISCONTINUED] predniSONE (STERAPRED UNI-PAK 21 TAB) 5 MG (21) TBPK tablet Take dosepak as directed   [DISCONTINUED] pregabalin (LYRICA) 75 MG capsule Take 1 capsule (75 mg total) by mouth 2 (two) times daily.   [DISCONTINUED] tadalafil (CIALIS) 5 MG tablet Take 5 mg by mouth daily as needed for erectile dysfunction.   No facility-administered encounter medications on file as of 01/31/2023.    Past Medical History:  Diagnosis Date   ADHD (attention deficit hyperactivity disorder)    as a child, per patient "out grown it as an adult"   Chicken pox    Diabetes mellitus without complication (HCC)    type 2   Family history of breast cancer    Family history of breast cancer gene mutation in first degree relative    History of kidney stones    surgery to remove   Hyperlipidemia    Hx:  no meds, diet controlled   Hypertension    Sleep apnea    CiPaP at night, every night  Past Surgical History:  Procedure Laterality Date   ANKLE ARTHROSCOPY Right 06/21/2017   Procedure: RIGHT ANKLE ARTHROSCOPY WITH DEBRIDEMENT;  Surgeon: Nadara Mustard, MD;  Location: [facility] OR;  Service: Orthopedics;  Laterality: Right;   CYSTOSCOPY W/ URETERAL STENT PLACEMENT  08/07/2011   Procedure: CYSTOSCOPY WITH RETROGRADE PYELOGRAM/URETERAL STENT PLACEMENT;  Surgeon: Ky Barban, MD;  Location: [facility];  Service: Urology;  Laterality: Left;  Cystoscopy, Left Retrograde Pyelogram, Left Ureteral Ballon Dilation, Double J Stent Placement   GLUTEUS  MINIMUS REPAIR Right 07/28/2019   Procedure: RIGHT HIP TENDON TEAR REPAIR;  Surgeon: Cammy Copa, MD;  Location: [facility];  Service: Orthopedics;  Laterality: Right;   MENISCUS REPAIR     left knee 2018   SHOULDER OPEN ROTATOR CUFF REPAIR Right 08/07/2021   Procedure: right  shoulder arthroscopy, debridement, mini open rotator cuff tear repair;  Surgeon: Cammy Copa, MD;  Location: [facility] OR;  Service: Orthopedics;  Laterality: Right;   SHOULDER SURGERY     bilateral    Family History  Problem Relation Age of Onset   Breast cancer Mother 63       BARD1+   Prostate cancer Father    Diabetes Father    Cancer Sister        bile duct d. 68   Breast cancer Maternal Aunt        dx 65s   Multiple myeloma Maternal Grandmother        d. 45s   Breast cancer Maternal Great-grandmother        dx 37s   Anesthesia problems Neg Hx    Hypotension Neg Hx    Malignant hyperthermia Neg Hx    Pseudochol deficiency Neg Hx    Colon cancer Neg Hx    Colon polyps Neg Hx    Esophageal cancer Neg Hx    Rectal cancer Neg Hx    Stomach cancer Neg Hx     Social History   Socioeconomic History   Marital status: Married    Spouse name: Not on file   Number of children: 2   Years of education: 14   Highest education level: Not on file  Occupational History   Occupation: Data processing manager  Tobacco Use   Smoking status: Never   Smokeless tobacco: Never  Vaping Use   Vaping status: Never Used  Substance and Sexual Activity   Alcohol use: No   Drug use: No   Sexual activity: Yes  Other Topics Concern   Not on file  Social History Narrative   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    Social Determinants of Health   Financial Resource Strain: Not on file  Food Insecurity: Not on file  Transportation Needs: Not on file  Physical Activity: Not on file  Stress: Not on file  Social Connections: Not on  file  Intimate Partner Violence: Not on file   Review of Systems  Constitutional:  Positive for malaise/fatigue. Negative for chills and fever.  HENT:  Negative for sore throat.   Respiratory:  Negative for cough and shortness of breath.   Cardiovascular:  Negative for chest pain, palpitations and leg swelling.  Gastrointestinal:  Negative for abdominal pain, blood in stool, constipation, diarrhea, nausea and vomiting.  Genitourinary:  Negative for dysuria and hematuria.  Musculoskeletal:  Negative for myalgias.  Skin:  Negative for itching and rash.  Neurological:  Negative for  dizziness and headaches.  Psychiatric/Behavioral:  Negative for depression and suicidal ideas.    Objective    BP (!) 150/86   Pulse 84   Ht 5\' 7"  (1.702 m)   Wt 252 lb 12.8 oz (114.7 kg)   SpO2 96%   BMI 39.59 kg/m   Physical Exam Vitals reviewed.  Constitutional:      General: He is not in acute distress.    Appearance: Normal appearance. He is obese. He is not ill-appearing.  HENT:     Head: Normocephalic and atraumatic.     Right Ear: External ear normal.     Left Ear: External ear normal.     Nose: Nose normal. No congestion or rhinorrhea.     Mouth/Throat:     Mouth: Mucous membranes are moist.     Pharynx: Oropharynx is clear.  Eyes:     General: No scleral icterus.    Extraocular Movements: Extraocular movements intact.     Conjunctiva/sclera: Conjunctivae normal.     Pupils: Pupils are equal, round, and reactive to light.  Cardiovascular:     Rate and Rhythm: Normal rate and regular rhythm.     Pulses: Normal pulses.     Heart sounds: Normal heart sounds. No murmur heard. Pulmonary:     Effort: Pulmonary effort is normal.     Breath sounds: Normal breath sounds. No wheezing, rhonchi or rales.  Abdominal:     General: Abdomen is flat. Bowel sounds are normal. There is no distension.     Palpations: Abdomen is soft.     Tenderness: There is no abdominal tenderness.   Musculoskeletal:        General: No swelling or deformity. Normal range of motion.     Cervical back: Normal range of motion.  Skin:    General: Skin is warm and dry.     Capillary Refill: Capillary refill takes less than 2 seconds.  Neurological:     General: No focal deficit present.     Mental Status: He is alert and oriented to person, place, and time.     Motor: No weakness.  Psychiatric:        Mood and Affect: Mood normal.        Behavior: Behavior normal.        Thought Content: Thought content normal.    Assessment & Plan:   Problem List Items Addressed This Visit       Essential hypertension, benign    He endorses a history of essential hypertension.  Currently prescribed valsartan 320 mg daily.  His blood pressure is elevated today, 166/96 initially and 150/80 on repeat.  He regularly checks his blood pressure at home and states that readings are typically 130 over 80s. -No medication changes today.  Follow-up in 4 weeks for HTN check.      Obstructive apnea    He endorses nightly compliance with CPAP.  Followed by pulmonology in [place].      Type 2 diabetes mellitus without complication (HCC)    His acute concern today is wanting to improve his A1c.  His home blood sugar readings have been high recently.  He is currently prescribed Xigduo and Actos.  States that he was previously taking NovoLog as well, but this was discontinued once his A1c improved.  He has also tried Ozempic, but did not appreciate any significant improvement.  He stopped taking the medication as result. -Repeat A1c and urine microalbumin/creatinine ratio ordered today -Follow-up in 1 month for diabetes  management      History of hypogonadism    Previously on TRT but has been off of therapy for at least 6 months.  This was managed by his PCP.  He is not clear if he should resume therapy or not, but would like to have his testosterone level checked. -AM free and total testosterone levels  ordered today      Colon cancer screening - Primary    Gastroenterology referral placed today      Return in about 4 weeks (around 02/28/2023) for lab review, HTN, DM.   Billie Lade, MD

## 2023-01-31 NOTE — Assessment & Plan Note (Signed)
Gastroenterology referral placed today 

## 2023-01-31 NOTE — Assessment & Plan Note (Signed)
His acute concern today is wanting to improve his A1c.  His home blood sugar readings have been high recently.  He is currently prescribed Xigduo and Actos.  States that he was previously taking NovoLog as well, but this was discontinued once his A1c improved.  He has also tried Ozempic, but did not appreciate any significant improvement.  He stopped taking the medication as result. -Repeat A1c and urine microalbumin/creatinine ratio ordered today -Follow-up in 1 month for diabetes management

## 2023-01-31 NOTE — Patient Instructions (Signed)
It was a pleasure to see you today.  Thank you for giving [place] the opportunity to be involved in your care.  Below is a brief recap of your visit and next steps.  We will plan to see you again in 1 month.  Summary You have established care today We will check basic labs Gastroenterology referral placed Follow up in 4 weeks for lab review and BP check

## 2023-02-04 ENCOUNTER — Encounter: Payer: Self-pay | Admitting: *Deleted

## 2023-02-28 ENCOUNTER — Ambulatory Visit: Payer: BC Managed Care – PPO | Admitting: Internal Medicine

## 2023-03-18 ENCOUNTER — Other Ambulatory Visit (INDEPENDENT_AMBULATORY_CARE_PROVIDER_SITE_OTHER): Payer: BC Managed Care – PPO

## 2023-03-18 ENCOUNTER — Ambulatory Visit: Payer: BC Managed Care – PPO | Admitting: Surgical

## 2023-03-18 ENCOUNTER — Encounter: Payer: Self-pay | Admitting: Surgical

## 2023-03-18 DIAGNOSIS — M545 Low back pain, unspecified: Secondary | ICD-10-CM

## 2023-03-18 MED ORDER — PREDNISONE 10 MG (21) PO TBPK: ORAL_TABLET | ORAL | 0 refills | Status: DC

## 2023-03-18 NOTE — Progress Notes (Signed)
Office Visit Note   [name]           Date of Birth: 11-24-1965           MRN: [id number] Visit Date: 03/18/2023 Requested by: Billie Lade, MD [address] [place],  [place] [address] PCP: Billie Lade, MD  Subjective: Chief Complaint  Patient presents with   Lower Back - Pain    HPI: James [name] is a 57 y.o. male who presents to the office reporting he low back pain.  Patient states that he has had steady increase in left-sided low back pain over the last 2 weeks with now severe pain that is interfering with his ability to sleep.  He feels like he may have tweaked it when he was walking on sand while staying at the beach 2 weeks ago.  He localizes pain to the superior aspect of the left buttock without radiation of pain.  No right-sided symptoms.  No incontinence.  No history of prior spine surgery or ESI's.  He had a similar episode several years ago that was successfully improved with sounds like a intramuscular steroid injection.  Currently steroids are not a great option for him due to his A1c of 11.7.  Hard to put pressure on his left buttock when he is in a sitting position.  Pain is getting worse and worse steadily.  No groin pain or numbness/tingling.  He has tried ibuprofen and stretching which helps temporarily with pain for about 30 minutes to an hour but no lasting relief..                ROS: All systems reviewed are negative as they relate to the chief complaint within the history of present illness.  Patient denies fevers or chills.  Assessment & Plan: Visit Diagnoses:  1. Acute left-sided low back pain, unspecified whether sciatica present     Plan: Patient is a 57 year old male who presents for evaluation of low back pain.  Has worsening pain over the last 2 weeks with possibility of injury while he was walking on the sand while staying at the beach.  Things have gotten worse and worse to the point where it is interfering with  his sleep and ability to live his life.  Discussed the option of a steroid Dosepak but with his A1c of greater than 11, do not think this is a great option for him as he tries to regain better control of his blood sugar.  Patient would like to proceed with MRI of the lumbar spine for further evaluation of possibility of herniated disc.  Follow-up after MRI to review results with Dr. August Saucer.  Follow-Up Instructions: No follow-ups on file.   Orders:  Orders Placed This Encounter  Procedures   XR Lumbar Spine 2-3 Views   MR Lumbar Spine w/o contrast   Meds ordered this encounter  Medications   predniSONE (STERAPRED UNI-PAK 21 TAB) 10 MG (21) TBPK tablet    Sig: Take as directed on package    Dispense:  21 tablet    Refill:  0      Procedures: No procedures performed   Clinical Data: No additional findings.  Objective: Vital Signs: There were no vitals taken for this visit.  Physical Exam:  Constitutional: Patient appears well-developed HEENT:  Head: Normocephalic Eyes:EOM are normal Neck: Normal range of motion Cardiovascular: Normal rate Pulmonary/chest: Effort normal Neurologic: Patient is alert Skin: Skin is warm Psychiatric:  Patient has normal mood and affect  Ortho Exam: Ortho exam demonstrates negative straight leg raise bilaterally.  No pain with hip range of motion bilaterally.  No tenderness along the right side of the low back.  Mild tenderness along the axial lumbar spine and moderate tenderness over the left SI joint.  No tenderness over the greater trochanter.  He has excellent strength of hip flexion, quadricep, hamstring, dorsiflexion, plantarflexion, EHL rated 5/5 bilaterally.  No clonus noted bilaterally.  Specialty Comments:  MRI CERVICAL SPINE WITHOUT CONTRAST   TECHNIQUE: Multiplanar, multisequence MR imaging of the cervical spine was performed. No intravenous contrast was administered.   COMPARISON:  CT cervical spine dated September 29, 2022.    FINDINGS: Alignment: Straightening of the normal cervical lordosis. No listhesis.   Vertebrae: No fracture, evidence of discitis, or bone lesion.   Cord: Normal signal and morphology.   Posterior Fossa, vertebral arteries, paraspinal tissues: Negative.   Disc levels:   C2-C3: Negative disc. Severe left facet arthropathy mild bilateral uncovertebral hypertrophy. Mild-to-moderate left neuroforaminal stenosis. No spinal canal or right neuroforaminal stenosis.   C3-C4: Mild disc bulging with moderate right and mild left facet uncovertebral hypertrophy. Slight flattening of the ventral cord. Mild spinal canal stenosis. Severe right neuroforaminal stenosis. No left neuroforaminal stenosis.   C4-C5: Negative disc. Mild bilateral uncovertebral hypertrophy. Mild-to-moderate right and mild left neuroforaminal stenosis. No spinal canal stenosis.   C5-C6: Tiny shallow central disc protrusion and mild bilateral uncovertebral hypertrophy. Moderate right neuroforaminal stenosis. No spinal canal or left neuroforaminal stenosis.   C6-C7: Mild disc bulging with superimposed large right foraminal disc protrusion. Severe right neuroforaminal stenosis and impingement of the exiting right C7 nerve root. Slight flattening of the far right ventral cord. No spinal canal or left neuroforaminal stenosis.   C7-T1:  Negative.   IMPRESSION: 1. Large right foraminal disc protrusion at C6-C7 resulting in severe right neuroforaminal stenosis and impingement of the exiting right C7 nerve root. 2. Severe right neuroforaminal stenosis at C3-C4. 3. Moderate right neuroforaminal stenosis at C5-C6.     Electronically Signed   By: Obie Dredge M.D.   On: 10/13/2022 17:38  Imaging: No results found.   PMFS History: Patient Active Problem List   Diagnosis Date Noted   History of hypogonadism 01/31/2023   Colon cancer screening 01/31/2023   Synovitis of right shoulder    Complete tear of right  rotator cuff    Family history of breast cancer 08/17/2021   Family history of breast cancer gene mutation in first degree relative 08/17/2021   Type 2 diabetes mellitus without complication (HCC) 10/03/2018   Osteochondritis dissecans of right talus    Essential hypertension, benign 06/08/2016   Routine general medical examination at a health care facility 04/12/2016   Rash and nonspecific skin eruption 03/23/2015   Obstructive apnea 04/21/2014   Past Medical History:  Diagnosis Date   ADHD (attention deficit hyperactivity disorder)    as a child, per patient "out grown it as an adult"   Chicken pox    Diabetes mellitus without complication (HCC)    type 2   Family history of breast cancer    Family history of breast cancer gene mutation in first degree relative    History of kidney stones    surgery to remove   Hyperlipidemia    Hx:  no meds, diet controlled   Hypertension    Sleep apnea    CiPaP at night, every night    Family History  Problem Relation Age of Onset   Breast cancer Mother 56       BARD1+   Prostate cancer Father    Diabetes Father    Cancer Sister        bile duct d. 36   Breast cancer Maternal Aunt        dx 8s   Multiple myeloma Maternal Grandmother        d. 70s   Breast cancer Maternal Great-grandmother        dx 23s   Anesthesia problems Neg Hx    Hypotension Neg Hx    Malignant hyperthermia Neg Hx    Pseudochol deficiency Neg Hx    Colon cancer Neg Hx    Colon polyps Neg Hx    Esophageal cancer Neg Hx    Rectal cancer Neg Hx    Stomach cancer Neg Hx     Past Surgical History:  Procedure Laterality Date   ANKLE ARTHROSCOPY Right 06/21/2017   Procedure: RIGHT ANKLE ARTHROSCOPY WITH DEBRIDEMENT;  Surgeon: Nadara Mustard, MD;  Location: [facility] OR;  Service: Orthopedics;  Laterality: Right;   CYSTOSCOPY W/ URETERAL STENT PLACEMENT  08/07/2011   Procedure: CYSTOSCOPY WITH RETROGRADE PYELOGRAM/URETERAL STENT PLACEMENT;  Surgeon: Ky Barban,  MD;  Location: [facility];  Service: Urology;  Laterality: Left;  Cystoscopy, Left Retrograde Pyelogram, Left Ureteral Ballon Dilation, Double J Stent Placement   GLUTEUS MINIMUS REPAIR Right 07/28/2019   Procedure: RIGHT HIP TENDON TEAR REPAIR;  Surgeon: Cammy Copa, MD;  Location: [facility];  Service: Orthopedics;  Laterality: Right;   MENISCUS REPAIR     left knee 2018   SHOULDER OPEN ROTATOR CUFF REPAIR Right 08/07/2021   Procedure: right  shoulder arthroscopy, debridement, mini open rotator cuff tear repair;  Surgeon: Cammy Copa, MD;  Location: [facility] OR;  Service: Orthopedics;  Laterality: Right;   SHOULDER SURGERY     bilateral   Social History   Occupational History   Occupation: Data processing manager  Tobacco Use   Smoking status: Never   Smokeless tobacco: Never  Vaping Use   Vaping status: Never Used  Substance and Sexual Activity   Alcohol use: No   Drug use: No   Sexual activity: Yes

## 2023-03-18 NOTE — Telephone Encounter (Signed)
Okay to see me today if he wants

## 2023-03-22 ENCOUNTER — Ambulatory Visit: Payer: BC Managed Care – PPO | Admitting: Surgical

## 2023-04-05 ENCOUNTER — Ambulatory Visit
Admission: RE | Admit: 2023-04-05 | Discharge: 2023-04-05 | Disposition: A | Discharge status: Home / Self Care | Payer: BC Managed Care – PPO | Source: Ambulatory Visit | Attending: Orthopedic Surgery | Admitting: Orthopedic Surgery

## 2023-04-05 DIAGNOSIS — M545 Low back pain, unspecified: Secondary | ICD-10-CM

## 2023-04-11 ENCOUNTER — Ambulatory Visit: Payer: BC Managed Care – PPO | Admitting: Internal Medicine

## 2023-04-11 ENCOUNTER — Encounter: Payer: Self-pay | Admitting: Internal Medicine

## 2023-04-11 VITALS — BP 168/112 | HR 78 | Ht 67.0 in | Wt 257.4 lb

## 2023-04-11 DIAGNOSIS — E785 Hyperlipidemia, unspecified: Secondary | ICD-10-CM | POA: Diagnosis not present

## 2023-04-11 DIAGNOSIS — E1129 Type 2 diabetes mellitus with other diabetic kidney complication: Secondary | ICD-10-CM

## 2023-04-11 DIAGNOSIS — I10 Essential (primary) hypertension: Secondary | ICD-10-CM

## 2023-04-11 DIAGNOSIS — E1169 Type 2 diabetes mellitus with other specified complication: Secondary | ICD-10-CM

## 2023-04-11 DIAGNOSIS — R809 Proteinuria, unspecified: Secondary | ICD-10-CM

## 2023-04-11 DIAGNOSIS — E119 Type 2 diabetes mellitus without complications: Secondary | ICD-10-CM

## 2023-04-11 MED ORDER — METFORMIN HCL ER 500 MG PO TB24
ORAL_TABLET | ORAL | 0 refills | Status: DC
Start: 2023-04-11 — End: 2023-04-30

## 2023-04-11 MED ORDER — ONETOUCH ULTRA VI STRP
ORAL_STRIP | 5 refills | Status: DC
Start: 2023-04-11 — End: 2024-05-04

## 2023-04-11 MED ORDER — TIRZEPATIDE 2.5 MG/0.5ML ~~LOC~~ SOAJ: 2.5000 mg | SUBCUTANEOUS | 0 refills | Status: DC

## 2023-04-11 MED ORDER — ATORVASTATIN CALCIUM 40 MG PO TABS: 40.0000 mg | ORAL_TABLET | Freq: Every day | ORAL | 3 refills | Status: DC

## 2023-04-11 MED ORDER — OLMESARTAN MEDOXOMIL 20 MG PO TABS
20.0000 mg | ORAL_TABLET | Freq: Every day | ORAL | 2 refills | Status: DC
Start: 2023-04-11 — End: 2023-07-24

## 2023-04-11 NOTE — Progress Notes (Signed)
Established Patient Office Visit  Subjective   Patient ID: James [name], male    DOB: 1966/05/21  Age: 57 y.o. MRN: [id number]  Chief Complaint  Patient presents with   Follow-up   Mr. Carpio returns to care today for follow-up.  He was last evaluated by me on 8/1 as a new patient presenting to establish care.  No medication changes were made at that time, baseline labs were ordered, and he was referred to gastroenterology for screening colonoscopy.  4-week follow-up was arranged for lab review and further management of hypertension and diabetes.  In the interim, he was evaluated by orthopedic surgery on 9/16 for acute left-sided low back pain.  There have otherwise been no acute interval events.  Mr. Laber endorses lumbar back pain today but is otherwise asymptomatic and has no acute concerns to discuss.  Past Medical History:  Diagnosis Date   ADHD (attention deficit hyperactivity disorder)    as a child, per patient "out grown it as an adult"   Chicken pox    Diabetes mellitus without complication (HCC)    type 2   Family history of breast cancer    Family history of breast cancer gene mutation in first degree relative    History of kidney stones    surgery to remove   Hyperlipidemia    Hx:  no meds, diet controlled   Hypertension    Sleep apnea    CiPaP at night, every night   Past Surgical History:  Procedure Laterality Date   ANKLE ARTHROSCOPY Right 06/21/2017   Procedure: RIGHT ANKLE ARTHROSCOPY WITH DEBRIDEMENT;  Surgeon: Nadara Mustard, MD;  Location: [facility] OR;  Service: Orthopedics;  Laterality: Right;   CYSTOSCOPY W/ URETERAL STENT PLACEMENT  08/07/2011   Procedure: CYSTOSCOPY WITH RETROGRADE PYELOGRAM/URETERAL STENT PLACEMENT;  Surgeon: Ky Barban, MD;  Location: [facility];  Service: Urology;  Laterality: Left;  Cystoscopy, Left Retrograde Pyelogram, Left Ureteral Ballon Dilation, Double J Stent Placement   GLUTEUS MINIMUS REPAIR Right 07/28/2019   Procedure:  RIGHT HIP TENDON TEAR REPAIR;  Surgeon: Cammy Copa, MD;  Location: [facility];  Service: Orthopedics;  Laterality: Right;   MENISCUS REPAIR     left knee 2018   SHOULDER OPEN ROTATOR CUFF REPAIR Right 08/07/2021   Procedure: right  shoulder arthroscopy, debridement, mini open rotator cuff tear repair;  Surgeon: Cammy Copa, MD;  Location: [facility] OR;  Service: Orthopedics;  Laterality: Right;   SHOULDER SURGERY     bilateral   Social History   Tobacco Use   Smoking status: Never   Smokeless tobacco: Never  Vaping Use   Vaping status: Never Used  Substance Use Topics   Alcohol use: No   Drug use: No   Family History  Problem Relation Age of Onset   Breast cancer Mother 32       BARD1+   Prostate cancer Father    Diabetes Father    Cancer Sister        bile duct d. 64   Breast cancer Maternal Aunt        dx 58s   Multiple myeloma Maternal Grandmother        d. 80s   Breast cancer Maternal Great-grandmother        dx 15s   Anesthesia problems Neg Hx    Hypotension Neg Hx    Malignant hyperthermia Neg Hx    Pseudochol deficiency Neg Hx    Colon cancer Neg  Hx    Colon polyps Neg Hx    Esophageal cancer Neg Hx    Rectal cancer Neg Hx    Stomach cancer Neg Hx    Allergies  Allergen Reactions   Ceftriaxone Other (See Comments)    leads to increased photosensitivity, per hospital ([facility]) 02/08/2009   Review of Systems  Musculoskeletal:  Positive for back pain.  All other systems reviewed and are negative.    Objective:     BP (!) 168/112 (BP Location: Right Arm, Patient Position: Sitting, Cuff Size: Normal)   Pulse 78   Ht 5\' 7"  (1.702 m)   Wt 257 lb 6.4 oz (116.8 kg)   SpO2 97%   BMI 40.31 kg/m  BP Readings from Last 3 Encounters:  04/11/23 (!) 168/112  01/31/23 (!) 150/86  12/13/22 (!) 175/108   Physical Exam Vitals reviewed.  Constitutional:      General: He is not in acute distress.    Appearance: Normal appearance. He is obese.  He is not ill-appearing.  HENT:     Head: Normocephalic and atraumatic.     Right Ear: External ear normal.     Left Ear: External ear normal.     Nose: Nose normal. No congestion or rhinorrhea.     Mouth/Throat:     Mouth: Mucous membranes are moist.     Pharynx: Oropharynx is clear.  Eyes:     General: No scleral icterus.    Extraocular Movements: Extraocular movements intact.     Conjunctiva/sclera: Conjunctivae normal.     Pupils: Pupils are equal, round, and reactive to light.  Cardiovascular:     Rate and Rhythm: Normal rate and regular rhythm.     Pulses: Normal pulses.     Heart sounds: Normal heart sounds. No murmur heard. Pulmonary:     Effort: Pulmonary effort is normal.     Breath sounds: Normal breath sounds. No wheezing, rhonchi or rales.  Abdominal:     General: Abdomen is flat. Bowel sounds are normal. There is no distension.     Palpations: Abdomen is soft.     Tenderness: There is no abdominal tenderness.  Musculoskeletal:        General: No swelling or deformity. Normal range of motion.     Cervical back: Normal range of motion.  Skin:    General: Skin is warm and dry.     Capillary Refill: Capillary refill takes less than 2 seconds.  Neurological:     General: No focal deficit present.     Mental Status: He is alert and oriented to person, place, and time.     Motor: No weakness.  Psychiatric:        Mood and Affect: Mood normal.        Behavior: Behavior normal.        Thought Content: Thought content normal.   Last CBC Lab Results  Component Value Date   WBC 6.9 02/01/2023   HGB 15.6 02/01/2023   HCT 45.4 02/01/2023   MCV 88 02/01/2023   MCH 30.1 02/01/2023   RDW 13.0 02/01/2023   PLT 228 02/01/2023   Last metabolic panel Lab Results  Component Value Date   GLUCOSE 253 (H) 02/01/2023   NA 138 02/01/2023   K 5.2 02/01/2023   CL 99 02/01/2023   CO2 24 02/01/2023   BUN 15 02/01/2023   CREATININE 1.06 02/01/2023   EGFR 83 02/01/2023    CALCIUM 9.9 02/01/2023   PROT 6.3 02/01/2023  ALBUMIN 4.2 02/01/2023   LABGLOB 2.1 02/01/2023   BILITOT 1.0 02/01/2023   ALKPHOS 104 02/01/2023   AST 16 02/01/2023   ALT 20 02/01/2023   ANIONGAP 8 08/02/2021   Last lipids Lab Results  Component Value Date   CHOL 196 02/01/2023   HDL 47 02/01/2023   LDLCALC 134 (H) 02/01/2023   LDLDIRECT 234.0 06/08/2016   TRIG 85 02/01/2023   CHOLHDL 4.2 02/01/2023   Last hemoglobin A1c Lab Results  Component Value Date   HGBA1C 11.9 (H) 02/01/2023   Last thyroid functions Lab Results  Component Value Date   TSH 1.420 02/01/2023   Last vitamin D Lab Results  Component Value Date   VD25OH 25.8 (L) 02/01/2023   Last vitamin B12 and Folate Lab Results  Component Value Date   VITAMINB12 587 02/01/2023   FOLATE 10.8 02/01/2023   The 10-year ASCVD risk score (Arnett DK, et al., 2019) is: 19.7%    Assessment & Plan:   Problem List Items Addressed This Visit       Essential hypertension, benign - Primary    BP remains significantly elevated today, 185/119 initially and 168/112 on repeat.  He was previously prescribed valsartan 320 mg daily but states that he has been out for close to a month.  He periodically checks his blood pressure at home and states that readings have been 140/90s. 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  A1c 11.9 when updated in August.  Moderately increased microalbuminuria noted on urine study as well.  He was previously prescribed Xigduo and Actos but has not been taking medication for the past month.  He was also previously prescribed NovoLog and Ozempic.  Today he is quite motivated to make lifestyle modifications aimed at improving his blood sugar and is open to resuming appropriate medications. -Start Mounjaro 2.5 mg  weekly.  We will plan to up titrate the dose to 10 mg over the next 3 months. -Start metformin XR with ramp-up dosing -Adding ARB and statin therapy today      Hyperlipidemia associated with type 2 diabetes mellitus (HCC)    Lipid panel updated in August.  Total cholesterol 196 and LDL 134.  His 10-year ASCVD risk is 19.7%.  He was not on any cholesterol-lowering therapy previously. -Start atorvastatin 40 mg daily -Repeat CMP in 2 weeks      Return in about 3 months (around 07/12/2023).   Billie Lade, MD

## 2023-04-11 NOTE — Patient Instructions (Signed)
It was a pleasure to see you today.  Thank you for giving [place] the opportunity to be involved in your care.  Below is a brief recap of your visit and next steps.  We will plan to see you again in 3 months.  Summary Start Monjaro and Metformin XR for diabetes Start Olmesartan for hypertension Start atorvastatn for your cholesterol Please see the number for the GI office below Let me know after you complete your third injection of [facility] Chemistry panel in 2 weeks. Please drop off BP readings at that time Follow up with me in 3 months   GI Office [phone]

## 2023-04-11 NOTE — Assessment & Plan Note (Signed)
BP remains significantly elevated today, 185/119 initially and 168/112 on repeat.  He was previously prescribed valsartan 320 mg daily but states that he has been out for close to a month.  He periodically checks his blood pressure at home and states that readings have been 140/90s. -Start olmesartan 20 mg daily -He will continue to check his blood pressure at home and provide a BP log in 2 weeks -Check CMP in 2 weeks

## 2023-04-11 NOTE — Assessment & Plan Note (Signed)
Lipid panel updated in August.  Total cholesterol 196 and LDL 134.  His 10-year ASCVD risk is 19.7%.  He was not on any cholesterol-lowering therapy previously. -Start atorvastatin 40 mg daily -Repeat CMP in 2 weeks

## 2023-04-11 NOTE — Assessment & Plan Note (Signed)
Poorly controlled.  A1c 11.9 when updated in August.  Moderately increased microalbuminuria noted on urine study as well.  He was previously prescribed Xigduo and Actos but has not been taking medication for the past month.  He was also previously prescribed NovoLog and Ozempic.  Today he is quite motivated to make lifestyle modifications aimed at improving his blood sugar and is open to resuming appropriate medications. -Start Mounjaro 2.5 mg weekly.  We will plan to up titrate the dose to 10 mg over the next 3 months. -Start metformin XR with ramp-up dosing -Adding ARB and statin therapy today

## 2023-04-15 ENCOUNTER — Encounter: Payer: Self-pay | Admitting: Surgical

## 2023-04-15 ENCOUNTER — Ambulatory Visit: Payer: BC Managed Care – PPO | Admitting: Surgical

## 2023-04-15 DIAGNOSIS — M545 Low back pain, unspecified: Secondary | ICD-10-CM

## 2023-04-15 DIAGNOSIS — M546 Pain in thoracic spine: Secondary | ICD-10-CM

## 2023-04-17 NOTE — Progress Notes (Signed)
I called.  He is doing little bit better.  Needs MRI thoracic spine to evaluate T11-T12 disc.  Want to have that done so epidural steroid injections would be an option if his symptoms recur.  Thanks

## 2023-04-18 ENCOUNTER — Other Ambulatory Visit (HOSPITAL_BASED_OUTPATIENT_CLINIC_OR_DEPARTMENT_OTHER): Payer: Self-pay

## 2023-04-29 ENCOUNTER — Encounter: Payer: Self-pay | Admitting: Internal Medicine

## 2023-04-30 ENCOUNTER — Other Ambulatory Visit: Payer: Self-pay | Admitting: Internal Medicine

## 2023-04-30 DIAGNOSIS — E119 Type 2 diabetes mellitus without complications: Secondary | ICD-10-CM

## 2023-05-01 ENCOUNTER — Other Ambulatory Visit: Payer: Self-pay | Admitting: Internal Medicine

## 2023-05-01 DIAGNOSIS — E1129 Type 2 diabetes mellitus with other diabetic kidney complication: Secondary | ICD-10-CM

## 2023-05-01 MED ORDER — TIRZEPATIDE 5 MG/0.5ML ~~LOC~~ SOAJ
5.0000 mg | SUBCUTANEOUS | 0 refills | Status: DC
Start: 2023-05-01 — End: 2023-06-03

## 2023-05-03 LAB — CMP14+EGFR
ALT: 15 [IU]/L (ref 0–44)
AST: 17 [IU]/L (ref 0–40)
Albumin: 4.4 g/dL (ref 3.8–4.9)
Alkaline Phosphatase: 112 [IU]/L (ref 44–121)
BUN/Creatinine Ratio: 17 (ref 9–20)
BUN: 14 mg/dL (ref 6–24)
Bilirubin Total: 1 mg/dL (ref 0.0–1.2)
CO2: 23 mmol/L (ref 20–29)
Calcium: 9.8 mg/dL (ref 8.7–10.2)
Chloride: 101 mmol/L (ref 96–106)
Creatinine, Ser: 0.83 mg/dL (ref 0.76–1.27)
Globulin, Total: 1.9 g/dL (ref 1.5–4.5)
Glucose: 251 mg/dL — ABNORMAL HIGH (ref 70–99)
Potassium: 4.1 mmol/L (ref 3.5–5.2)
Sodium: 139 mmol/L (ref 134–144)
Total Protein: 6.3 g/dL (ref 6.0–8.5)
eGFR: 103 mL/min/{1.73_m2} (ref >59)

## 2023-05-06 ENCOUNTER — Other Ambulatory Visit: Payer: Self-pay

## 2023-05-06 ENCOUNTER — Ambulatory Visit: Payer: BC Managed Care – PPO | Admitting: Physical Medicine and Rehabilitation

## 2023-05-06 DIAGNOSIS — M47816 Spondylosis without myelopathy or radiculopathy, lumbar region: Secondary | ICD-10-CM | POA: Diagnosis not present

## 2023-05-06 MED ORDER — METHYLPREDNISOLONE ACETATE 40 MG/ML IJ SUSP
40.0000 mg | Freq: Once | INTRAMUSCULAR | Status: AC
Start: 2023-05-06 — End: 2023-05-06
  Administered 2023-05-06: 40 mg

## 2023-05-06 NOTE — Progress Notes (Signed)
Functional Pain Scale - descriptive words and definitions  Distressing (6)    Pain is present/unable to complete most ADLs limited by pain/sleep is difficult and active distraction is only marginal. Moderate range order  Average Pain 6  166/108 +Driver, -BT, -Dye Allergies.

## 2023-05-06 NOTE — Progress Notes (Signed)
James REVOLORIO [name] - 57 y.o. male MRN [id number]  Date of birth: 03-28-1966  Office Visit Note: Visit Date: 05/06/2023 PCP: Billie Lade, MD Referred by: Billie Lade, MD  Subjective: Chief Complaint  Patient presents with   Lower Back - Pain   HPI:  James [name] is a 57 y.o. male who comes in today at the request of Karenann Cai, PA-C for planned Left  L4-5 Lumbar facet/medial branch block with fluoroscopic guidance.  The patient has failed conservative care including home exercise, medications, time and activity modification.  This injection will be diagnostic and hopefully therapeutic.  Please see requesting physician notes for further details and justification.  Exam has shown concordant pain with facet joint loading.   ROS Otherwise per HPI.  Assessment & Plan: Visit Diagnoses:    ICD-10-CM   1. Spondylosis without myelopathy or radiculopathy, lumbar region  M47.816 XR C-ARM NO REPORT    Facet Injection    methylPREDNISolone acetate (DEPO-MEDROL) injection 40 mg      Plan: No additional findings.   Meds & Orders:  Meds ordered this encounter  Medications   methylPREDNISolone acetate (DEPO-MEDROL) injection 40 mg    Orders Placed This Encounter  Procedures   Facet Injection   XR C-ARM NO REPORT    Follow-up: Return for visit to requesting provider as needed.   Procedures: No procedures performed  Lumbar Facet Joint Intra-Articular Injection(s) with Fluoroscopic Guidance  [name]      Date of Birth: 15-May-1966 MRN: [id number] PCP: Billie Lade, MD      Visit Date: 05/06/2023   Universal Protocol:    Date/Time: 05/06/2023  Consent Given By: the patient  Position: PRONE   Additional Comments: Vital signs were monitored before and after the procedure. Patient was prepped and draped in the usual sterile fashion. The correct patient, procedure, and site was verified.   Injection Procedure Details:  Procedure Site  One Meds Administered:  Meds ordered this encounter  Medications   methylPREDNISolone acetate (DEPO-MEDROL) injection 40 mg     Laterality: Left  Location/Site:  L4-L5  Needle size: 22 guage  Needle type: Spinal  Needle Placement: Articular  Findings:  -Comments: Excellent flow of contrast producing a partial arthrogram.  Procedure Details: The fluoroscope beam is vertically oriented in AP, and the inferior recess is visualized beneath the lower pole of the inferior apophyseal process, which represents the target point for needle insertion. When direct visualization is difficult the target point is located at the medial projection of the vertebral pedicle. The region overlying each aforementioned target is locally anesthetized with a 1 to 2 ml. volume of 1% Lidocaine without Epinephrine.   The spinal needle was inserted into each of the above mentioned facet joints using biplanar fluoroscopic guidance. A 0.25 to 0.5 ml. volume of Isovue-250 was injected and a partial facet joint arthrogram was obtained. A single spot film was obtained of the resulting arthrogram.    One to 1.25 ml of the steroid/anesthetic solution was then injected into each of the facet joints noted above.   Additional Comments:  No complications occurred Dressing: 2 x 2 sterile gauze and Band-Aid    Post-procedure details: Patient was observed during the procedure. Post-procedure instructions were reviewed.  Patient left the clinic in stable condition.    Clinical History: MRI CERVICAL SPINE WITHOUT CONTRAST   TECHNIQUE: Multiplanar, multisequence MR imaging of the cervical spine was performed. No intravenous contrast was administered.  COMPARISON:  CT cervical spine dated September 29, 2022.   FINDINGS: Alignment: Straightening of the normal cervical lordosis. No listhesis.   Vertebrae: No fracture, evidence of discitis, or bone lesion.   Cord: Normal signal and morphology.   Posterior Fossa,  vertebral arteries, paraspinal tissues: Negative.   Disc levels:   C2-C3: Negative disc. Severe left facet arthropathy mild bilateral uncovertebral hypertrophy. Mild-to-moderate left neuroforaminal stenosis. No spinal canal or right neuroforaminal stenosis.   C3-C4: Mild disc bulging with moderate right and mild left facet uncovertebral hypertrophy. Slight flattening of the ventral cord. Mild spinal canal stenosis. Severe right neuroforaminal stenosis. No left neuroforaminal stenosis.   C4-C5: Negative disc. Mild bilateral uncovertebral hypertrophy. Mild-to-moderate right and mild left neuroforaminal stenosis. No spinal canal stenosis.   C5-C6: Tiny shallow central disc protrusion and mild bilateral uncovertebral hypertrophy. Moderate right neuroforaminal stenosis. No spinal canal or left neuroforaminal stenosis.   C6-C7: Mild disc bulging with superimposed large right foraminal disc protrusion. Severe right neuroforaminal stenosis and impingement of the exiting right C7 nerve root. Slight flattening of the far right ventral cord. No spinal canal or left neuroforaminal stenosis.   C7-T1:  Negative.   IMPRESSION: 1. Large right foraminal disc protrusion at C6-C7 resulting in severe right neuroforaminal stenosis and impingement of the exiting right C7 nerve root. 2. Severe right neuroforaminal stenosis at C3-C4. 3. Moderate right neuroforaminal stenosis at C5-C6.     Electronically Signed   By: Obie Dredge M.D.   On: 10/13/2022 17:38     Objective:  VS:  HT:    WT:   BMI:     BP:   HR: bpm  TEMP: ( )  RESP:  Physical Exam Vitals and nursing note reviewed.  Constitutional:      General: He is not in acute distress.    Appearance: Normal appearance. He is not ill-appearing.  HENT:     Head: Normocephalic and atraumatic.     Right Ear: External ear normal.     Left Ear: External ear normal.     Nose: No congestion.  Eyes:     Extraocular Movements:  Extraocular movements intact.  Cardiovascular:     Rate and Rhythm: Normal rate.     Pulses: Normal pulses.  Pulmonary:     Effort: Pulmonary effort is normal. No respiratory distress.  Abdominal:     General: There is no distension.     Palpations: Abdomen is soft.  Musculoskeletal:        General: No tenderness or signs of injury.     Cervical back: Neck supple.     Right lower leg: No edema.     Left lower leg: No edema.     Comments: Patient has good distal strength without clonus.  Skin:    Findings: No erythema or rash.  Neurological:     General: No focal deficit present.     Mental Status: He is alert and oriented to person, place, and time.     Sensory: No sensory deficit.     Motor: No weakness or abnormal muscle tone.     Coordination: Coordination normal.  Psychiatric:        Mood and Affect: Mood normal.        Behavior: Behavior normal.      Imaging: No results found.

## 2023-05-06 NOTE — Procedures (Signed)
Lumbar Facet Joint Intra-Articular Injection(s) with Fluoroscopic Guidance  [name]      Date of Birth: 04/04/1966 MRN: [id number] PCP: Billie Lade, MD      Visit Date: 05/06/2023   Universal Protocol:    Date/Time: 05/06/2023  Consent Given By: the patient  Position: PRONE   Additional Comments: Vital signs were monitored before and after the procedure. Patient was prepped and draped in the usual sterile fashion. The correct patient, procedure, and site was verified.   Injection Procedure Details:  Procedure Site One Meds Administered:  Meds ordered this encounter  Medications   methylPREDNISolone acetate (DEPO-MEDROL) injection 40 mg     Laterality: Left  Location/Site:  L4-L5  Needle size: 22 guage  Needle type: Spinal  Needle Placement: Articular  Findings:  -Comments: Excellent flow of contrast producing a partial arthrogram.  Procedure Details: The fluoroscope beam is vertically oriented in AP, and the inferior recess is visualized beneath the lower pole of the inferior apophyseal process, which represents the target point for needle insertion. When direct visualization is difficult the target point is located at the medial projection of the vertebral pedicle. The region overlying each aforementioned target is locally anesthetized with a 1 to 2 ml. volume of 1% Lidocaine without Epinephrine.   The spinal needle was inserted into each of the above mentioned facet joints using biplanar fluoroscopic guidance. A 0.25 to 0.5 ml. volume of Isovue-250 was injected and a partial facet joint arthrogram was obtained. A single spot film was obtained of the resulting arthrogram.    One to 1.25 ml of the steroid/anesthetic solution was then injected into each of the facet joints noted above.   Additional Comments:  No complications occurred Dressing: 2 x 2 sterile gauze and Band-Aid    Post-procedure details: Patient was observed during the  procedure. Post-procedure instructions were reviewed.  Patient left the clinic in stable condition.

## 2023-05-06 NOTE — Patient Instructions (Signed)

## 2023-05-12 ENCOUNTER — Ambulatory Visit
Admission: RE | Admit: 2023-05-12 | Discharge: 2023-05-12 | Disposition: A | Discharge status: Home / Self Care | Payer: BC Managed Care – PPO | Source: Ambulatory Visit | Attending: Surgical | Admitting: Surgical

## 2023-05-12 DIAGNOSIS — M546 Pain in thoracic spine: Secondary | ICD-10-CM

## 2023-05-27 NOTE — Telephone Encounter (Signed)
Yes please

## 2023-05-27 NOTE — Telephone Encounter (Signed)
Called and requested report.

## 2023-05-27 NOTE — Telephone Encounter (Signed)
No results in chart yet. Do you want me to call for a stat report?

## 2023-05-28 ENCOUNTER — Encounter: Payer: Self-pay | Admitting: Physical Medicine and Rehabilitation

## 2023-06-03 ENCOUNTER — Other Ambulatory Visit: Payer: Self-pay | Admitting: Internal Medicine

## 2023-06-03 DIAGNOSIS — R809 Proteinuria, unspecified: Secondary | ICD-10-CM

## 2023-06-03 NOTE — Progress Notes (Signed)
Called and discussed MRI results with patient.  He is feeling a lot better after recent ESI with Dr. Alvester Morin.  Although constant pain he was having has significant calm down.  He would like to just do home exercise program and he will reach out to [place] in the future if anything changes or if he would like to repeat L-spine ESI.  Follow-up as needed.

## 2023-06-18 ENCOUNTER — Encounter: Payer: Self-pay | Admitting: Internal Medicine

## 2023-06-18 NOTE — Telephone Encounter (Signed)
 Care team updated and letter sent for eye exam notes.

## 2023-07-04 ENCOUNTER — Other Ambulatory Visit: Payer: Self-pay | Admitting: Internal Medicine

## 2023-07-04 DIAGNOSIS — E119 Type 2 diabetes mellitus without complications: Secondary | ICD-10-CM

## 2023-07-04 DIAGNOSIS — R809 Proteinuria, unspecified: Secondary | ICD-10-CM

## 2023-07-19 NOTE — Progress Notes (Unsigned)
ERRONEOUS ENCOUNTER

## 2023-07-24 ENCOUNTER — Other Ambulatory Visit: Payer: Self-pay | Admitting: Internal Medicine

## 2023-07-24 DIAGNOSIS — I10 Essential (primary) hypertension: Secondary | ICD-10-CM

## 2023-08-07 ENCOUNTER — Encounter (INDEPENDENT_AMBULATORY_CARE_PROVIDER_SITE_OTHER): Payer: Self-pay | Admitting: *Deleted

## 2023-08-09 ENCOUNTER — Other Ambulatory Visit: Payer: Self-pay | Admitting: Internal Medicine

## 2023-08-09 DIAGNOSIS — R809 Proteinuria, unspecified: Secondary | ICD-10-CM

## 2023-08-21 ENCOUNTER — Other Ambulatory Visit: Payer: Self-pay

## 2023-08-21 ENCOUNTER — Encounter: Payer: Self-pay | Admitting: Internal Medicine

## 2023-08-21 DIAGNOSIS — Z1211 Encounter for screening for malignant neoplasm of colon: Secondary | ICD-10-CM

## 2023-08-27 ENCOUNTER — Encounter (INDEPENDENT_AMBULATORY_CARE_PROVIDER_SITE_OTHER): Payer: Self-pay | Admitting: *Deleted

## 2023-09-19 ENCOUNTER — Encounter: Payer: Self-pay | Admitting: Family Medicine

## 2023-09-19 ENCOUNTER — Ambulatory Visit: Payer: Self-pay | Admitting: Family Medicine

## 2023-09-19 VITALS — BP 179/121 | HR 92 | Ht 67.0 in | Wt 249.0 lb

## 2023-09-19 DIAGNOSIS — B9689 Other specified bacterial agents as the cause of diseases classified elsewhere: Secondary | ICD-10-CM | POA: Diagnosis not present

## 2023-09-19 DIAGNOSIS — J329 Chronic sinusitis, unspecified: Secondary | ICD-10-CM

## 2023-09-19 DIAGNOSIS — B370 Candidal stomatitis: Secondary | ICD-10-CM

## 2023-09-19 MED ORDER — PROMETHAZINE-DM 6.25-15 MG/5ML PO SYRP: 5.0000 mL | ORAL_SOLUTION | Freq: Four times a day (QID) | ORAL | 0 refills | Status: DC | PRN

## 2023-09-19 MED ORDER — DOXYCYCLINE HYCLATE 100 MG PO TABS: 100.0000 mg | ORAL_TABLET | Freq: Two times a day (BID) | ORAL | 0 refills | Status: AC

## 2023-09-19 MED ORDER — NYSTATIN 100000 UNIT/ML MT SUSP: 5.0000 mL | Freq: Four times a day (QID) | OROMUCOSAL | 0 refills | Status: DC

## 2023-09-19 NOTE — Assessment & Plan Note (Addendum)
 Encouraged to start using Nystatin 5 mL by mouth four times daily. Swish in the mouth for at least 30 seconds before swallowing for best effectiveness. Additional Recommendations: Avoid sugary foods and drinks, which can promote fungal growth. Brush your teeth regularly and consider using a soft toothbrush to avoid irritation. Consider probiotics or yogurt to help maintain healthy oral flora. The patent reports having order to assess his hga1c in a week

## 2023-09-19 NOTE — Patient Instructions (Addendum)
 I appreciate the opportunity to provide care to you today!    Bacterial Sinusitis Complete the full course of the prescribed antibiotic to fully treat the infection. Promethazine DM 5 mL by mouth every 4 hours as needed for cough and cold symptoms. Symptom Management & Home Care: Increase fluid intake and get plenty of rest to support recovery. Take Tylenol as needed for pain, fever, or general discomfort. Throat care: Perform warm saltwater gargles ( teaspoon of salt in warm water, 3-4 times daily). Ginger tea may help reduce throat irritation and nausea. Use sugar-free or honey-based lozenges for throat relief. Cough & Congestion Relief: Use a humidifier at bedtime to ease cough and nasal congestion. For nasal congestion, try heated humidified air and saline nasal sprays. Honey may help reduce cough frequency and severity. Follow-Up: If symptoms do not improve within 10 days or worsen (high fever, facial pain, swelling, or worsening congestion), seek further medical evaluation.  Oral Candidiasis (Thrush) Start using Nystatin 5 mL by mouth four times daily. Swish in the mouth for at least 30 seconds before swallowing for best effectiveness. Additional Recommendations: Avoid sugary foods and drinks, which can promote fungal growth. Brush your teeth regularly and consider using a soft toothbrush to avoid irritation. If using inhaled steroids (e.g., for asthma or COPD), rinse your mouth after each use to prevent recurrence. Consider probiotics or yogurt to help maintain healthy oral flora.     Please continue to a heart-healthy diet and increase your physical activities. Try to exercise for at least five days a week.    It was a pleasure to see you and I look forward to continuing to work together on your health and well-being. Please do not hesitate to call the office if you need care or have questions about your care.  In case of emergency, please visit the Emergency  Department for urgent care, or contact our clinic at [phone] to schedule an appointment. We're here to help you!   Have a wonderful day and week. With Gratitude, Gilmore Laroche MSN, FNP-BC

## 2023-09-19 NOTE — Progress Notes (Signed)
 Acute Office Visit  Subjective:    Patient ID: James [name], male    DOB: 05-01-1966, 58 y.o.   MRN: [id number]  Chief Complaint  Patient presents with   URI    Pt here for congestion, runny nose, and cough since last Friday, pt states using over the counter nasal spray helps but as soon as he lays down he gets congested, coughing. Pt states his tongue and mouth is white     HPI Patient is in today for the complaints listed the chief complaints. Onset of symptoms for a week.   Past Medical History:  Diagnosis Date   ADHD (attention deficit hyperactivity disorder)    as a child, per patient "out grown it as an adult"   Chicken pox    Diabetes mellitus without complication (HCC)    type 2   Family history of breast cancer    Family history of breast cancer gene mutation in first degree relative    History of kidney stones    surgery to remove   Hyperlipidemia    Hx:  no meds, diet controlled   Hypertension    Sleep apnea    CiPaP at night, every night    Past Surgical History:  Procedure Laterality Date   ANKLE ARTHROSCOPY Right 06/21/2017   Procedure: RIGHT ANKLE ARTHROSCOPY WITH DEBRIDEMENT;  Surgeon: Nadara Mustard, MD;  Location: [facility] OR;  Service: Orthopedics;  Laterality: Right;   CYSTOSCOPY W/ URETERAL STENT PLACEMENT  08/07/2011   Procedure: CYSTOSCOPY WITH RETROGRADE PYELOGRAM/URETERAL STENT PLACEMENT;  Surgeon: Ky Barban, MD;  Location: [facility];  Service: Urology;  Laterality: Left;  Cystoscopy, Left Retrograde Pyelogram, Left Ureteral Ballon Dilation, Double J Stent Placement   GLUTEUS MINIMUS REPAIR Right 07/28/2019   Procedure: RIGHT HIP TENDON TEAR REPAIR;  Surgeon: Cammy Copa, MD;  Location: [facility];  Service: Orthopedics;  Laterality: Right;   MENISCUS REPAIR     left knee 2018   SHOULDER OPEN ROTATOR CUFF REPAIR Right 08/07/2021   Procedure: right  shoulder arthroscopy, debridement, mini open rotator cuff tear repair;   Surgeon: Cammy Copa, MD;  Location: [facility] OR;  Service: Orthopedics;  Laterality: Right;   SHOULDER SURGERY     bilateral    Family History  Problem Relation Age of Onset   Breast cancer Mother 17       BARD1+   Prostate cancer Father    Diabetes Father    Cancer Sister        bile duct d. 42   Breast cancer Maternal Aunt        dx 37s   Multiple myeloma Maternal Grandmother        d. 13s   Breast cancer Maternal Great-grandmother        dx 43s   Anesthesia problems Neg Hx    Hypotension Neg Hx    Malignant hyperthermia Neg Hx    Pseudochol deficiency Neg Hx    Colon cancer Neg Hx    Colon polyps Neg Hx    Esophageal cancer Neg Hx    Rectal cancer Neg Hx    Stomach cancer Neg Hx     Social History   Socioeconomic History   Marital status: Married    Spouse name: Not on file   Number of children: 2   Years of education: 14   Highest education level: Not on file  Occupational History   Occupation: Data processing manager  Tobacco Use  Smoking status: Never   Smokeless tobacco: Never  Vaping Use   Vaping status: Never Used  Substance and Sexual Activity   Alcohol use: No   Drug use: No   Sexual activity: Yes  Other Topics Concern   Not on file  Social History Narrative   Fun: Works in his shop and work on [employer], farm   Denies religious beliefs effecting health care.    Social Drivers of Corporate investment banker Strain: Not on file  Food Insecurity: Not on file  Transportation Needs: Not on file  Physical Activity: Not on file  Stress: Not on file  Social Connections: Not on file  Intimate Partner Violence: Not on file    Outpatient Medications Prior to Visit  Medication Sig Dispense Refill   atorvastatin (LIPITOR) 40 MG tablet Take 1 tablet (40 mg total) by mouth daily. 90 tablet 3   glucose blood (ONETOUCH ULTRA) test strip Use as instructed 100 each 5   metFORMIN (GLUCOPHAGE-XR) 500 MG 24 hr tablet TAKE ONE TABLET BY MOUTH ONCE DAILY  FOR 7 DAYS, THEN ONE TABLET TWICE DAILY FOR 7 DAYS, THEN TWO IN THE MORNING AND ONE IN THE EVENING FOR 7 DAYS, AND FINALLY TWO TWICE DAILY. TAKE WITH FOOD. 84 tablet 0   MOUNJARO 10 MG/0.5ML Pen INJECT 10MG  INTO THE SKIN ONCE A WEEK 2 mL 2   Multiple Vitamins-Minerals (MULTIVITAMIN WITH MINERALS) tablet Take 1 tablet by mouth daily.     olmesartan (BENICAR) 20 MG tablet TAKE ONE TABLET BY MOUTH ONCE DAILY 30 tablet 2   No facility-administered medications prior to visit.    Allergies  Allergen Reactions   Ceftriaxone Other (See Comments)    leads to increased photosensitivity, per hospital ([facility]) 02/08/2009    Review of Systems  HENT:  Positive for congestion, rhinorrhea, sinus pressure and sinus pain.   Respiratory:  Positive for cough.        Objective:    Physical Exam HENT:     Head: Normocephalic.     Right Ear: External ear normal.     Left Ear: External ear normal.     Nose: No congestion or rhinorrhea.     Right Sinus: Maxillary sinus tenderness present.     Left Sinus: Maxillary sinus tenderness present.     Mouth/Throat:     Mouth: Mucous membranes are moist.  Cardiovascular:     Rate and Rhythm: Regular rhythm.     Heart sounds: No murmur heard. Pulmonary:     Effort: No respiratory distress.     Breath sounds: Normal breath sounds.  Neurological:     Mental Status: He is alert.     BP (!) 179/121 (BP Location: Right Arm, Patient Position: Sitting, Cuff Size: Large)   Pulse 92   Ht 5\' 7"  (1.702 m)   Wt 249 lb (112.9 kg)   SpO2 97%   BMI 39.00 kg/m  Wt Readings from Last 3 Encounters:  09/19/23 249 lb (112.9 kg)  04/11/23 257 lb 6.4 oz (116.8 kg)  01/31/23 252 lb 12.8 oz (114.7 kg)       Assessment & Plan:  Bacterial sinusitis Assessment & Plan: Encouraged to complete the full course of the prescribed antibiotic t Promethazine DM 5 mL by mouth every 4 hours as needed for cough and cold symptoms. Symptom Management & Home Care: Increase fluid  intake and get plenty of rest to support recovery. Take Tylenol as needed for pain, fever, or general discomfort. Throat  care: Perform warm saltwater gargles ( teaspoon of salt in warm water, 3-4 times daily). Ginger tea may help reduce throat irritation and nausea. Use sugar-free or honey-based lozenges for throat relief. Cough & Congestion Relief: Use a humidifier at bedtime to ease cough and nasal congestion. For nasal congestion, try heated humidified air and saline nasal sprays. Honey may help reduce cough frequency and severity. Follow-Up: If symptoms do not improve within 10 days or worsen (high fever, facial pain, swelling, or worsening congestion), seek further medical evaluation.   Orders: -     Promethazine-DM; Take 5 mLs by mouth 4 (four) times daily as needed.  Dispense: 118 mL; Refill: 0 -     Doxycycline Hyclate; Take 1 tablet (100 mg total) by mouth 2 (two) times daily for 5 days.  Dispense: 10 tablet; Refill: 0  Oral candidiasis Assessment & Plan: Encouraged to start using Nystatin 5 mL by mouth four times daily. Swish in the mouth for at least 30 seconds before swallowing for best effectiveness. Additional Recommendations: Avoid sugary foods and drinks, which can promote fungal growth. Brush your teeth regularly and consider using a soft toothbrush to avoid irritation. Consider probiotics or yogurt to help maintain healthy oral flora. The patent reports having order to assess his hga1c in a week  Orders: -     Nystatin; Take 5 mLs (500,000 Units total) by mouth 4 (four) times daily.  Dispense: 60 mL; Refill: 0  Note: This chart has been completed using Engineer, civil (consulting) software, and while attempts have been made to ensure accuracy, certain words and phrases may not be transcribed as intended.    Gilmore Laroche, FNP

## 2023-09-19 NOTE — Assessment & Plan Note (Signed)
 Encouraged to complete the full course of the prescribed antibiotic t Promethazine DM 5 mL by mouth every 4 hours as needed for cough and cold symptoms. Symptom Management & Home Care: Increase fluid intake and get plenty of rest to support recovery. Take Tylenol as needed for pain, fever, or general discomfort. Throat care: Perform warm saltwater gargles ( teaspoon of salt in warm water, 3-4 times daily). Ginger tea may help reduce throat irritation and nausea. Use sugar-free or honey-based lozenges for throat relief. Cough & Congestion Relief: Use a humidifier at bedtime to ease cough and nasal congestion. For nasal congestion, try heated humidified air and saline nasal sprays. Honey may help reduce cough frequency and severity. Follow-Up: If symptoms do not improve within 10 days or worsen (high fever, facial pain, swelling, or worsening congestion), seek further medical evaluation.

## 2023-09-24 ENCOUNTER — Telehealth: Payer: Self-pay | Admitting: *Deleted

## 2023-09-24 ENCOUNTER — Encounter: Payer: Self-pay | Admitting: *Deleted

## 2023-09-24 NOTE — Telephone Encounter (Signed)
  Procedure: colonoscopy   Height: 5'7" Weight: 240 lb      Have you had a colonoscopy before?  no  Do you have family history of colon cancer?  no  Do you have a family history of polyps? no  Previous colonoscopy with polyps removed? no  Do you have a history colorectal cancer?   no  Are you diabetic?  Type 2  Do you have a prosthetic or mechanical heart valve? no  Do you have a pacemaker/defibrillator?   no  Have you had endocarditis/atrial fibrillation?  no  Do you use supplemental oxygen/CPAP? yes  Have you had joint replacement within the last 12 months?  no  Do you tend to be constipated or have to use laxatives?  no   Do you have history of alcohol use? If yes, how much and how often.  no  Do you have history or are you using drugs? If yes, what do are you  using?  no  Have you ever had a stroke/heart attack?  no  Have you ever had a heart or other vascular stent placed,?no  Do you take weight loss medication? no  Do you take any blood-thinning medications such as: (Plavix, aspirin, Coumadin, Aggrenox, Brilinta, Xarelto, Eliquis, Pradaxa, Savaysa or Effient)? no  If yes we need the name, milligram, dosage and who is prescribing doctor:  n/a             Current Outpatient Medications  Medication Sig Dispense Refill   atorvastatin (LIPITOR) 40 MG tablet Take 1 tablet (40 mg total) by mouth daily. 90 tablet 3   glucose blood (ONETOUCH ULTRA) test strip Use as instructed 100 each 5   metFORMIN (GLUCOPHAGE-XR) 500 MG 24 hr tablet TAKE ONE TABLET BY MOUTH ONCE DAILY FOR 7 DAYS, THEN ONE TABLET TWICE DAILY FOR 7 DAYS, THEN TWO IN THE MORNING AND ONE IN THE EVENING FOR 7 DAYS, AND FINALLY TWO TWICE DAILY. TAKE WITH FOOD. 84 tablet 0   MOUNJARO 10 MG/0.5ML Pen INJECT 10MG  INTO THE SKIN ONCE A WEEK 2 mL 2   olmesartan (BENICAR) 20 MG tablet TAKE ONE TABLET BY MOUTH ONCE DAILY 30 tablet 2   doxycycline (VIBRA-TABS) 100 MG tablet Take 1 tablet (100 mg total) by mouth 2  (two) times daily for 5 days. (Patient not taking: Reported on 09/24/2023) 10 tablet 0   Multiple Vitamins-Minerals (MULTIVITAMIN WITH MINERALS) tablet Take 1 tablet by mouth daily. (Patient not taking: Reported on 09/24/2023)     nystatin (MYCOSTATIN) 100000 UNIT/ML suspension Take 5 mLs (500,000 Units total) by mouth 4 (four) times daily. (Patient not taking: Reported on 09/24/2023) 60 mL 0   promethazine-dextromethorphan (PROMETHAZINE-DM) 6.25-15 MG/5ML syrup Take 5 mLs by mouth 4 (four) times daily as needed. (Patient not taking: Reported on 09/24/2023) 118 mL 0   No current facility-administered medications for this visit.    Allergies  Allergen Reactions   Ceftriaxone Other (See Comments)    leads to increased photosensitivity, per hospital ([facility]) 02/08/2009

## 2023-09-26 ENCOUNTER — Ambulatory Visit: Admitting: Orthopedic Surgery

## 2023-10-21 ENCOUNTER — Encounter: Payer: Self-pay | Admitting: Orthopedic Surgery

## 2023-10-21 ENCOUNTER — Other Ambulatory Visit (INDEPENDENT_AMBULATORY_CARE_PROVIDER_SITE_OTHER)

## 2023-10-21 ENCOUNTER — Ambulatory Visit: Admitting: Orthopedic Surgery

## 2023-10-21 DIAGNOSIS — M542 Cervicalgia: Secondary | ICD-10-CM

## 2023-10-21 DIAGNOSIS — M5412 Radiculopathy, cervical region: Secondary | ICD-10-CM

## 2023-10-21 MED ORDER — PREGABALIN 75 MG PO CAPS: 75.0000 mg | ORAL_CAPSULE | Freq: Two times a day (BID) | ORAL | 1 refills | Status: DC

## 2023-10-21 MED ORDER — METHYLPREDNISOLONE 4 MG PO TBPK
ORAL_TABLET | ORAL | 0 refills | Status: DC
Start: 2023-10-21 — End: 2023-12-09

## 2023-10-21 NOTE — Progress Notes (Signed)
 Orthopedic Spine Surgery Office Note  Assessment: Patient is a 58 y.o. male with neck pain that radiates into the left clavicle, suspect radiculopathy   Plan: -Explained that initially conservative treatment is tried as a significant number of patients may experience relief with these treatment modalities. Discussed that the conservative treatments include:  -activity modification  -physical therapy  -over the counter pain medications  -medrol  dosepak  -cervical steroid injections -Prescribed a medrol  dose pak and lyrica  since this has worked previously -Also recommended a diagnostic/therapeutic cervical injection. Referral provided to him today -If he is not doing any better at our next visit, will order an MRI to evaluate for radiculopathy -Patient should return to office in 6 weeks, x-rays at next visit: none   Patient expressed understanding of the plan and all questions were answered to the patient's satisfaction.   __________________________________________________________________________  History: Patient is a 58 y.o. male who has been previously seen in the office for symptoms consistent with cervical radiculopathy. His pain has improved significantly. Still has some numbness and paresthesias and numbness in his left index and long finger. He said that these symptoms are tolerable. He has now developed pain in his neck that goes into the left clavicle area. He said it has been present for 2 days. There was no trauma or injury that preceded the onset of this pain. He feels with with activity and with rest. He has had trouble sleeping at night as a result of the pain. He notes the pain is worse if he looks to the left. Has no pain radiating past the clavicle. Has not noticed any weakness. No bowel or bladder incontinence. No saddle anesthesia. Rates the pain as a 9/10 at its worst.     Physical Exam:  General: no acute distress, appears stated age Neurologic: alert, answering  questions appropriately, following commands Respiratory: unlabored breathing on room air, symmetric chest rise Psychiatric: appropriate affect, normal cadence to speech   MSK (spine):  -Strength exam      Left  Right Grip strength                5/5  5/5 Interosseus   5/5   5/5 Wrist extension  5/5  5/5 Wrist flexion   5/5  5/5 Elbow flexion   5/5  5/5 Deltoid    5/5  5/5  -Sensory exam    Sensation intact to light touch in C5-T1 nerve distributions of bilateral upper extremities  -Brachioradialis DTR: 2/4 on the left, 2/4 on the right -Biceps DTR: 2/4 on the left, 2/4 on the right  -Spurling: negative bilaterally -Hoffman sign: negative bilaterally -Clonus: no beats bilaterally -Interosseous wasting: none seen -Grip and release test: negative  -Romberg: negative -Gait: normal  Left shoulder exam: no pain through range of motion Right shoulder exam: no pain through range of motion  Tinel's at wrist: negative bilaterally Phalen's at wrist: negative bilaterally Durkan's: negative bilaterally  Tinel's at elbow: negative bilaterally  Imaging: XRs of the cervical spine from 10/21/2023 were independently reviewed and interpreted, showing disc height loss at C6/7 with anterior osteophyte formation. Neutral alignment. No evidence of instability on flexion/extension views. No fracture or dislocation seen.    Patient name: James [name] Patient MRN: [id number] Date of visit: 10/21/23

## 2023-11-01 ENCOUNTER — Telehealth: Payer: Self-pay | Admitting: Physical Medicine and Rehabilitation

## 2023-11-01 ENCOUNTER — Encounter: Payer: Self-pay | Admitting: *Deleted

## 2023-11-01 ENCOUNTER — Other Ambulatory Visit: Payer: Self-pay | Admitting: *Deleted

## 2023-11-01 MED ORDER — PEG 3350-KCL-NA BICARB-NACL 420 G PO SOLR: 4000.0000 mL | Freq: Once | ORAL | 0 refills | Status: AC

## 2023-11-01 NOTE — Telephone Encounter (Signed)
 Patient called. LM on the PT VM, he needs to [facility] his appointment. Would like the week of 5/19

## 2023-11-01 NOTE — Telephone Encounter (Signed)
 Day of prep: 1/2 dose metformin  AM of TCS: hold metformin  Hold Mounjaro  for 7 days before. ASA 2

## 2023-11-01 NOTE — Telephone Encounter (Signed)
 Pt has been scheduled for 11/29/23 with Dr.Carver. instructions mailed and prep sent to the pharmacy.

## 2023-11-04 ENCOUNTER — Telehealth: Payer: Self-pay | Admitting: Internal Medicine

## 2023-11-05 ENCOUNTER — Encounter: Admitting: Physical Medicine and Rehabilitation

## 2023-11-05 ENCOUNTER — Encounter (INDEPENDENT_AMBULATORY_CARE_PROVIDER_SITE_OTHER): Payer: Self-pay | Admitting: *Deleted

## 2023-11-05 NOTE — Telephone Encounter (Signed)
procedure note and pathology result faxed to PCP

## 2023-11-06 ENCOUNTER — Other Ambulatory Visit: Payer: Self-pay | Admitting: Internal Medicine

## 2023-11-06 DIAGNOSIS — E119 Type 2 diabetes mellitus without complications: Secondary | ICD-10-CM

## 2023-11-18 ENCOUNTER — Encounter: Admitting: Physical Medicine and Rehabilitation

## 2023-11-18 ENCOUNTER — Encounter: Payer: Self-pay | Admitting: Internal Medicine

## 2023-11-18 ENCOUNTER — Other Ambulatory Visit: Payer: Self-pay

## 2023-11-18 ENCOUNTER — Telehealth: Payer: Self-pay | Admitting: *Deleted

## 2023-11-18 DIAGNOSIS — Z1211 Encounter for screening for malignant neoplasm of colon: Secondary | ICD-10-CM

## 2023-11-18 NOTE — Telephone Encounter (Signed)
Cologuard placed

## 2023-11-18 NOTE — Telephone Encounter (Signed)
 Pt is cancelling his procedure tomorrow due to being sick. He will call back to reschedule once he is feeling better or he said he may just do the cologuard. FYI

## 2023-11-19 ENCOUNTER — Encounter (HOSPITAL_COMMUNITY): Payer: Self-pay

## 2023-11-19 ENCOUNTER — Ambulatory Visit (HOSPITAL_COMMUNITY): Admit: 2023-11-19 | Admitting: Internal Medicine

## 2023-11-19 SURGERY — COLONOSCOPY
Anesthesia: Choice

## 2023-11-21 ENCOUNTER — Encounter: Admitting: Physical Medicine and Rehabilitation

## 2023-11-21 ENCOUNTER — Telehealth: Payer: Self-pay | Admitting: Physical Medicine and Rehabilitation

## 2023-11-21 NOTE — Telephone Encounter (Signed)
 Pt request to r/s his appointment

## 2023-12-02 ENCOUNTER — Encounter: Payer: Self-pay | Admitting: Internal Medicine

## 2023-12-02 ENCOUNTER — Encounter: Payer: Self-pay | Admitting: Orthopedic Surgery

## 2023-12-02 ENCOUNTER — Ambulatory Visit: Admitting: Orthopedic Surgery

## 2023-12-04 LAB — COLOGUARD: COLOGUARD: NEGATIVE

## 2023-12-05 ENCOUNTER — Ambulatory Visit: Payer: Self-pay | Admitting: Internal Medicine

## 2023-12-09 ENCOUNTER — Encounter: Payer: Self-pay | Admitting: Physical Medicine and Rehabilitation

## 2023-12-09 ENCOUNTER — Ambulatory Visit: Admitting: Physical Medicine and Rehabilitation

## 2023-12-09 ENCOUNTER — Other Ambulatory Visit: Payer: Self-pay

## 2023-12-09 VITALS — BP 184/118

## 2023-12-09 DIAGNOSIS — M5412 Radiculopathy, cervical region: Secondary | ICD-10-CM | POA: Diagnosis not present

## 2023-12-09 MED ORDER — METHYLPREDNISOLONE ACETATE 40 MG/ML IJ SUSP
40.0000 mg | Freq: Once | INTRAMUSCULAR | Status: AC
  Administered 2023-12-09: 40 mg

## 2023-12-09 NOTE — Patient Instructions (Signed)

## 2023-12-09 NOTE — Progress Notes (Signed)
 James [name] - 58 y.o. male MRN [id number]  Date of birth: May 25, 1966  Office Visit Note: Visit Date: 12/09/2023 PCP: Tobi Fortes, MD Referred by: Diedra Fowler, MD  Subjective: Chief Complaint  Patient presents with   Neck - Pain   HPI:  James [name] is a 58 y.o. male who comes in today at the request of Dr. Colette Davies for planned Left C7-T1 Lumbar Interlaminar epidural steroid injection with fluoroscopic guidance.  The patient has failed conservative care including home exercise, medications, time and activity modification.  This injection will be diagnostic and hopefully therapeutic.  Please see requesting physician notes for further details and justification.   ROS Otherwise per HPI.  Assessment & Plan: Visit Diagnoses:    ICD-10-CM   1. Cervical radiculopathy  M54.12 XR C-ARM NO REPORT    Epidural Steroid injection    methylPREDNISolone  acetate (DEPO-MEDROL ) injection 40 mg      Plan: No additional findings.   Meds & Orders:  Meds ordered this encounter  Medications   methylPREDNISolone  acetate (DEPO-MEDROL ) injection 40 mg    Orders Placed This Encounter  Procedures   XR C-ARM NO REPORT   Epidural Steroid injection    Follow-up: Return if symptoms worsen or fail to improve.   Procedures: No procedures performed  Cervical Epidural Steroid Injection - Interlaminar Approach with Fluoroscopic Guidance  [name]      Date of Birth: April 28, 1966 MRN: [id number] PCP: Tobi Fortes, MD      Visit Date: 12/09/2023   Universal Protocol:    Date/Time: 06/09/253:25 PM  Consent Given By: the patient  Position: PRONE  Additional Comments: Vital signs were monitored before and after the procedure. Patient was prepped and draped in the usual sterile fashion. The correct patient, procedure, and site was verified.   Injection Procedure Details:   Procedure diagnoses: Cervical radiculopathy [M54.12]    Meds  Administered:  Meds ordered this encounter  Medications   methylPREDNISolone  acetate (DEPO-MEDROL ) injection 40 mg     Laterality: Left  Location/Site: C7-T1  Needle: 3.5 in., 20 ga. Tuohy  Needle Placement: Paramedian epidural space  Findings:  -Comments: Excellent flow of contrast into the epidural space.  Procedure Details: Using a paramedian approach from the side mentioned above, the region overlying the inferior lamina was localized under fluoroscopic visualization and the soft tissues overlying this structure were infiltrated with 4 ml. of 1% Lidocaine  without Epinephrine. A # 20 gauge, Tuohy needle was inserted into the epidural space using a paramedian approach.  The epidural space was localized using loss of resistance along with contralateral oblique bi-planar fluoroscopic views.  After negative aspirate for air, blood, and CSF, a 2 ml. volume of Isovue -250 was injected into the epidural space and the flow of contrast was observed. Radiographs were obtained for documentation purposes.   The injectate was administered into the level noted above.  Additional Comments:  The patient tolerated the procedure well Dressing: 2 x 2 sterile gauze and Band-Aid    Post-procedure details: Patient was observed during the procedure. Post-procedure instructions were reviewed.  Patient left the clinic in stable condition.   Clinical History: MRI CERVICAL SPINE WITHOUT CONTRAST   TECHNIQUE: Multiplanar, multisequence MR imaging of the cervical spine was performed. No intravenous contrast was administered.   COMPARISON:  CT cervical spine dated September 29, 2022.   FINDINGS: Alignment: Straightening of the normal cervical lordosis. No listhesis.   Vertebrae: No fracture, evidence of  discitis, or bone lesion.   Cord: Normal signal and morphology.   Posterior Fossa, vertebral arteries, paraspinal tissues: Negative.   Disc levels:   C2-C3: Negative disc. Severe left facet  arthropathy mild bilateral uncovertebral hypertrophy. Mild-to-moderate left neuroforaminal stenosis. No spinal canal or right neuroforaminal stenosis.   C3-C4: Mild disc bulging with moderate right and mild left facet uncovertebral hypertrophy. Slight flattening of the ventral cord. Mild spinal canal stenosis. Severe right neuroforaminal stenosis. No left neuroforaminal stenosis.   C4-C5: Negative disc. Mild bilateral uncovertebral hypertrophy. Mild-to-moderate right and mild left neuroforaminal stenosis. No spinal canal stenosis.   C5-C6: Tiny shallow central disc protrusion and mild bilateral uncovertebral hypertrophy. Moderate right neuroforaminal stenosis. No spinal canal or left neuroforaminal stenosis.   C6-C7: Mild disc bulging with superimposed large right foraminal disc protrusion. Severe right neuroforaminal stenosis and impingement of the exiting right C7 nerve root. Slight flattening of the far right ventral cord. No spinal canal or left neuroforaminal stenosis.   C7-T1:  Negative.   IMPRESSION: 1. Large right foraminal disc protrusion at C6-C7 resulting in severe right neuroforaminal stenosis and impingement of the exiting right C7 nerve root. 2. Severe right neuroforaminal stenosis at C3-C4. 3. Moderate right neuroforaminal stenosis at C5-C6.     Electronically Signed   By: Aleta Anda M.D.   On: 10/13/2022 17:38     Objective:  VS:  HT:    WT:   BMI:     BP:(!) 184/118  HR: bpm  TEMP: ( )  RESP:  Physical Exam Vitals and nursing note reviewed.  Constitutional:      General: He is not in acute distress.    Appearance: Normal appearance. He is not ill-appearing.  HENT:     Head: Normocephalic and atraumatic.     Right Ear: External ear normal.     Left Ear: External ear normal.     Nose: No congestion.  Eyes:     Extraocular Movements: Extraocular movements intact.  Cardiovascular:     Rate and Rhythm: Normal rate.     Pulses: Normal  pulses.  Pulmonary:     Effort: Pulmonary effort is normal. No respiratory distress.  Abdominal:     General: There is no distension.     Palpations: Abdomen is soft.  Musculoskeletal:        General: No tenderness or signs of injury.     Cervical back: Neck supple.     Right lower leg: No edema.     Left lower leg: No edema.     Comments: Patient has good distal strength without clonus.  Skin:    Findings: No erythema or rash.  Neurological:     General: No focal deficit present.     Mental Status: He is alert and oriented to person, place, and time.     Sensory: No sensory deficit.     Motor: No weakness or abnormal muscle tone.     Coordination: Coordination normal.  Psychiatric:        Mood and Affect: Mood normal.        Behavior: Behavior normal.      Imaging: XR C-ARM NO REPORT Result Date: 12/09/2023 Please see Notes tab for imaging impression.

## 2023-12-09 NOTE — Procedures (Signed)
 Cervical Epidural Steroid Injection - Interlaminar Approach with Fluoroscopic Guidance  [name]      Date of Birth: [birth year] MRN: [id number] PCP: Tobi Fortes, MD      Visit Date: 12/09/2023   Universal Protocol:    Date/Time: 06/09/253:25 PM  Consent Given By: the patient  Position: PRONE  Additional Comments: Vital signs were monitored before and after the procedure. Patient was prepped and draped in the usual sterile fashion. The correct patient, procedure, and site was verified.   Injection Procedure Details:   Procedure diagnoses: Cervical radiculopathy [M54.12]    Meds Administered:  Meds ordered this encounter  Medications   methylPREDNISolone  acetate (DEPO-MEDROL ) injection 40 mg     Laterality: Left  Location/Site: C7-T1  Needle: 3.5 in., 20 ga. Tuohy  Needle Placement: Paramedian epidural space  Findings:  -Comments: Excellent flow of contrast into the epidural space.  Procedure Details: Using a paramedian approach from the side mentioned above, the region overlying the inferior lamina was localized under fluoroscopic visualization and the soft tissues overlying this structure were infiltrated with 4 ml. of 1% Lidocaine  without Epinephrine. A # 20 gauge, Tuohy needle was inserted into the epidural space using a paramedian approach.  The epidural space was localized using loss of resistance along with contralateral oblique bi-planar fluoroscopic views.  After negative aspirate for air, blood, and CSF, a 2 ml. volume of Isovue -250 was injected into the epidural space and the flow of contrast was observed. Radiographs were obtained for documentation purposes.   The injectate was administered into the level noted above.  Additional Comments:  The patient tolerated the procedure well Dressing: 2 x 2 sterile gauze and Band-Aid    Post-procedure details: Patient was observed during the procedure. Post-procedure instructions were  reviewed.  Patient left the clinic in stable condition.

## 2023-12-09 NOTE — Progress Notes (Signed)
 Patient is here with posterior neck pain.  He states that the neck feels really stiff, to the point, on occasion, that he has difficulty turning his head to the left.  Pain does not radiate into arms.  He does not have headaches associated with it. "It feels like I have jammed my neck and it needs to pop".  He was taking gabapentin  but is not now unless he absolutely has to.   Pain Scale   Average Pain 5        +Driver, -BT, -Dye Allergies.

## 2024-02-18 ENCOUNTER — Ambulatory Visit: Payer: Self-pay | Admitting: Nurse Practitioner

## 2024-02-18 ENCOUNTER — Encounter: Payer: Self-pay | Admitting: Nurse Practitioner

## 2024-02-18 VITALS — BP 172/109 | HR 62 | Ht 67.0 in | Wt 237.0 lb

## 2024-02-18 DIAGNOSIS — E1169 Type 2 diabetes mellitus with other specified complication: Secondary | ICD-10-CM | POA: Diagnosis not present

## 2024-02-18 DIAGNOSIS — R5383 Other fatigue: Secondary | ICD-10-CM

## 2024-02-18 DIAGNOSIS — N529 Male erectile dysfunction, unspecified: Secondary | ICD-10-CM | POA: Diagnosis not present

## 2024-02-18 DIAGNOSIS — E663 Overweight: Secondary | ICD-10-CM

## 2024-02-18 MED ORDER — SILDENAFIL CITRATE 100 MG PO TABS
50.0000 mg | ORAL_TABLET | Freq: Every day | ORAL | 11 refills | Status: AC | PRN
Start: 2024-02-18 — End: ?

## 2024-02-18 NOTE — Patient Instructions (Signed)
 1) Fatigue - CBC, CMP, TSH 2) Hx of DMII - A1c and lipid 3) ED - testosterone , and viagra  4) Follow up appt in 4 months, fasting labs at that appt

## 2024-02-18 NOTE — Progress Notes (Addendum)
 Established Patient Office Visit  Subjective:  Patient ID: James [name], male    DOB: 1965-07-29  Age: 58 y.o. MRN: [id number]  Chief Complaint  Patient presents with   Transitions Of Care    Transferring from Dr Melvenia to [facility]    Patient here today for a followup and is fasting for labs.  Patient needs some refills and also would like ED medication guidance.  Stopped Mounjaro  1 and 1/2 months ago due to nausea, fatigue, eye sight blurriness.      No other concerns at this time.   Past Medical History:  Diagnosis Date   ADHD (attention deficit hyperactivity disorder)    as a child, per patient out grown it as an adult   Chicken pox    Diabetes mellitus without complication (HCC)    type 2   Family history of breast cancer    Family history of breast cancer gene mutation in first degree relative    History of kidney stones    surgery to remove   Hyperlipidemia    Hx:  no meds, diet controlled   Hypertension    Sleep apnea    CiPaP at night, every night    Past Surgical History:  Procedure Laterality Date   ANKLE ARTHROSCOPY Right 06/21/2017   Procedure: RIGHT ANKLE ARTHROSCOPY WITH DEBRIDEMENT;  Surgeon: Harden Jerona GAILS, MD;  Location: [facility];  Service: Orthopedics;  Laterality: Right;   CYSTOSCOPY W/ URETERAL STENT PLACEMENT  08/07/2011   Procedure: CYSTOSCOPY WITH RETROGRADE PYELOGRAM/URETERAL STENT PLACEMENT;  Surgeon: Emery LILLETTE Blaze, MD;  Location: [facility];  Service: Urology;  Laterality: Left;  Cystoscopy, Left Retrograde Pyelogram, Left Ureteral Ballon Dilation, Double J Stent Placement   GLUTEUS MINIMUS REPAIR Right 07/28/2019   Procedure: RIGHT HIP TENDON TEAR REPAIR;  Surgeon: Addie Cordella Hamilton, MD;  Location: [facility];  Service: Orthopedics;  Laterality: Right;   MENISCUS REPAIR     left knee 2018   SHOULDER OPEN ROTATOR CUFF REPAIR Right 08/07/2021   Procedure: right  shoulder arthroscopy, debridement, mini open rotator cuff tear  repair;  Surgeon: Addie Cordella Hamilton, MD;  Location: [facility] OR;  Service: Orthopedics;  Laterality: Right;   SHOULDER SURGERY     bilateral    Social History   Socioeconomic History   Marital status: Married    Spouse name: Not on file   Number of children: 2   Years of education: 14   Highest education level: Associate degree: occupational, Scientist, product/process development, or vocational program  Occupational History   Occupation: Data processing manager  Tobacco Use   Smoking status: Never   Smokeless tobacco: Never  Vaping Use   Vaping status: Never Used  Substance and Sexual Activity   Alcohol use: No   Drug use: No   Sexual activity: Yes  Other Topics Concern   Not on file  Social History Narrative   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    Social Drivers of Corporate investment banker Strain: Low Risk  (02/14/2024)   Overall Financial Resource Strain (CARDIA)    Difficulty of Paying Living Expenses: Not hard at all  Food Insecurity: No Food Insecurity (02/14/2024)   Hunger Vital Sign    Worried About Running Out of Food in the Last Year: Never true    Ran Out of Food in the Last Year: Never true  Transportation Needs: No Transportation Needs (02/14/2024)  PRAPARE - Administrator, Civil Service (Medical): No    Lack of Transportation (Non-Medical): No  Physical Activity: Insufficiently Active (02/14/2024)   Exercise Vital Sign    Days of Exercise per Week: 2 days    Minutes of Exercise per Session: 30 min  Stress: Stress Concern Present (02/14/2024)   [employer] of Occupational Health - Occupational Stress Questionnaire    Feeling of Stress: To some extent  Social Connections: Unknown (02/14/2024)   Social Connection and Isolation Panel    Frequency of Communication with Friends and Family: More than three times a week    Frequency of Social Gatherings with Friends and Family: Not on file    Attends Religious  Services: More than 4 times per year    Active Member of [employer] or Organizations: Not on file    Attends Banker Meetings: Not on file    Marital Status: Married  Catering manager Violence: Not on file    Family History  Problem Relation Age of Onset   Breast cancer Mother 29       BARD1+   Prostate cancer Father    Diabetes Father    Cancer Sister        bile duct d. 27   Breast cancer Maternal Aunt        dx 60s   Multiple myeloma Maternal Grandmother        d. 79s   Breast cancer Maternal Great-grandmother        dx 47s   Anesthesia problems Neg Hx    Hypotension Neg Hx    Malignant hyperthermia Neg Hx    Pseudochol deficiency Neg Hx    Colon cancer Neg Hx    Colon polyps Neg Hx    Esophageal cancer Neg Hx    Rectal cancer Neg Hx    Stomach cancer Neg Hx     Allergies  Allergen Reactions   Ceftriaxone Other (See Comments)    leads to increased photosensitivity, per hospital ([facility]) 02/08/2009    Outpatient Medications Prior to Visit  Medication Sig   atorvastatin  (LIPITOR) 40 MG tablet Take 1 tablet (40 mg total) by mouth daily.   glucose blood (ONETOUCH ULTRA) test strip Use as instructed   metFORMIN  (GLUCOPHAGE -XR) 500 MG 24 hr tablet TAKE ONE TABLET BY MOUTH ONCE DAILY FOR 7 DAYS, THEN ONE TABLET TWICE DAILY FOR 7 DAYS, THEN TWO IN THE MORNING AND ONE IN THE EVENING FOR 7 DAYS, AND FINALLY TWO TWICE DAILY. TAKE WITH FOOD.   olmesartan  (BENICAR ) 20 MG tablet TAKE ONE TABLET BY MOUTH ONCE DAILY   MOUNJARO  10 MG/0.5ML Pen INJECT 10MG  INTO THE SKIN ONCE A WEEK (Patient not taking: Reported on 02/18/2024)   Multiple Vitamins-Minerals (MULTIVITAMIN WITH MINERALS) tablet Take 1 tablet by mouth daily. (Patient not taking: Reported on 02/18/2024)   nystatin  (MYCOSTATIN ) 100000 UNIT/ML suspension Take 5 mLs (500,000 Units total) by mouth 4 (four) times daily. (Patient not taking: Reported on 02/18/2024)   pregabalin  (LYRICA ) 75 MG capsule Take 1 capsule (75 mg total)  by mouth 2 (two) times daily. (Patient not taking: Reported on 02/18/2024)   promethazine -dextromethorphan (PROMETHAZINE -DM) 6.25-15 MG/5ML syrup Take 5 mLs by mouth 4 (four) times daily as needed. (Patient not taking: Reported on 02/18/2024)   No facility-administered medications prior to visit.    ROS     Objective:   BP (!) 172/109   Pulse 62   Ht 5' 7 (1.702 m)   Wt 237  lb (107.5 kg)   SpO2 98%   BMI 37.12 kg/m   Vitals:   02/18/24 1302  BP: (!) 172/109  Pulse: 62  Height: 5' 7 (1.702 m)  Weight: 237 lb (107.5 kg)  SpO2: 98%  BMI (Calculated): 37.11    Physical Exam Vitals and nursing note reviewed.  Constitutional:      Appearance: Normal appearance.  HENT:     Head: Normocephalic.     Nose: Nose normal.     Mouth/Throat:     Mouth: Mucous membranes are moist.  Cardiovascular:     Rate and Rhythm: Normal rate and regular rhythm.     Pulses: Normal pulses.     Heart sounds: Normal heart sounds.  Pulmonary:     Effort: Pulmonary effort is normal.     Breath sounds: Normal breath sounds.  Musculoskeletal:        General: Normal range of motion.     Cervical back: Normal range of motion and neck supple.  Skin:    General: Skin is warm and dry.  Neurological:     Mental Status: He is alert and oriented to person, place, and time.  Psychiatric:        Mood and Affect: Mood normal.        Behavior: Behavior normal.      No results found for any visits on 02/18/24.  Recent Results (from the past 2160 hours)  Cologuard     Status: None   Collection Time: 11/27/23  4:00 PM  Result Value Ref Range   COLOGUARD Negative Negative    Comment: The Cologuard (TM) test was performed on this specimen.  NEGATIVE TEST RESULT. A negative Cologuard result indicates a low likelihood that a colorectal cancer (CRC) or advanced adenoma (adenomatous polyps with more advanced pre-malignant features) is present. The chance that a person with a negative Cologuard test has a  colorectal cancer is less than 1 in 1500 (negative predictive value >99.9%) or has an advanced adenoma is less than 5.3% (negative predictive value 94.7%). These data are based on a prospective cross-sectional study of 10,000 individuals at average risk for colorectal cancer who were screened with both Cologuard and colonoscopy. (Imperiale T. et al, N Engl J Med 2014;370(14):1286-1297) The normal value (reference range) for this assay is negative.  COLOGUARD RE-SCREENING RECOMMENDATION: Periodic colorectal cancer screening is an important part of preventive healthcare for asymptomatic individuals at average risk for colorectal cancer. Following a negative Cologuard  result, the American Cancer Society and U.S. Multi-Society Task Force screening guidelines recommend a Cologuard re-screening interval of 3 years.  References: American Cancer Society Guideline for Colorectal Cancer Screening: https://www.cancer.org/cancer/colon-rectal-cancer/detection-diagnosis-staging/acs-recommendations.html.; Rex DK, Boland CR, Dominitz JK, Colorectal Cancer Screening: Recommendations for Physicians and Patients from the U.S. Multi-Society Task Force on Colorectal Cancer Screening , Am J Gastroenterology 2017; 112:1016-1030.  TEST DESCRIPTION: Composite algorithmic analysis of stool DNA-biomarkers with hemoglobin immunoassay.   Quantitative values of individual biomarkers are not reportable and are not associated with individual biomarker result reference ranges. Cologuard is intended for colorectal cancer screening of adults of either sex, 45 years or older, who are at average-risk for colorectal cancer (CRC). Cologuard has been approved for use by the U.S.  FDA. The performance of Cologuard was established in a cross sectional study of average-risk adults aged 66-84. Cologuard performance in patients ages 33 to 5 years was estimated by sub-group analysis of near-age groups. Colonoscopies performed for a positive result may  find as the most clinically significant  lesion: colorectal cancer [4.0%], advanced adenoma (including sessile serrated polyps greater than or equal to 1cm diameter) [20%] or non- advanced adenoma [31%]; or no colorectal neoplasia [45%]. These estimates are derived from a prospective cross-sectional screening study of 10,000 individuals at average risk for colorectal cancer who were screened with both Cologuard and colonoscopy. (Imperiale T. et al, LOISE [place] J Med 2014;370(14):1286-1297.) Cologuard may produce a false negative or false positive result (no colorectal cancer or precancerous polyp present at colonoscopy follow up). A negative Cologuard test result does not guarantee the absence of CRC or advanced adenoma  (pre-cancer). The current Cologuard screening interval is every 3 years. Science writer and U.S. Therapist, music). Cologuard performance data in a 10,000 patient pivotal study using colonoscopy as the reference method can be accessed at the following location: www.exactlabs.com/results. Additional description of the Cologuard test process, warnings and precautions can be found at www.cologuard.com.       Assessment & Plan:  1) Fatigue - CBC, CMP, TSH 2) Hx of DMII - A1c and lipid 3) ED - testosterone , and viagra  4) Follow up appt in 4 months, fasting labs at that appt   Problem List Items Addressed This Visit   None   No follow-ups on file.   Total time spent: 20 minutes  Neale Carpen, NP  02/18/2024   This document may have been prepared by [facility] Voice Recognition software and as such may include unintentional dictation errors.

## 2024-02-19 ENCOUNTER — Encounter: Payer: Self-pay | Admitting: Nurse Practitioner

## 2024-02-19 LAB — CBC WITH DIFFERENTIAL/PLATELET
Basophils Absolute: 0 x10E3/uL (ref 0.0–0.2)
Basos: 0 %
EOS (ABSOLUTE): 0.2 x10E3/uL (ref 0.0–0.4)
Eos: 3 %
Hematocrit: 47.5 % (ref 37.5–51.0)
Hemoglobin: 16 g/dL (ref 13.0–17.7)
Immature Grans (Abs): 0 x10E3/uL (ref 0.0–0.1)
Immature Granulocytes: 0 %
Lymphocytes Absolute: 1.9 x10E3/uL (ref 0.7–3.1)
Lymphs: 28 %
MCH: 29.2 pg (ref 26.6–33.0)
MCHC: 33.7 g/dL (ref 31.5–35.7)
MCV: 87 fL (ref 79–97)
Monocytes Absolute: 0.5 x10E3/uL (ref 0.1–0.9)
Monocytes: 7 %
Neutrophils Absolute: 4.1 x10E3/uL (ref 1.4–7.0)
Neutrophils: 61 %
Platelets: 279 x10E3/uL (ref 150–450)
RBC: 5.48 x10E6/uL (ref 4.14–5.80)
RDW: 13.7 % (ref 11.6–15.4)
WBC: 6.8 x10E3/uL (ref 3.4–10.8)

## 2024-02-20 ENCOUNTER — Other Ambulatory Visit: Payer: Self-pay | Admitting: Nurse Practitioner

## 2024-02-20 ENCOUNTER — Ambulatory Visit: Payer: Self-pay

## 2024-02-20 DIAGNOSIS — E785 Hyperlipidemia, unspecified: Secondary | ICD-10-CM

## 2024-02-20 DIAGNOSIS — E1169 Type 2 diabetes mellitus with other specified complication: Secondary | ICD-10-CM

## 2024-02-20 DIAGNOSIS — E119 Type 2 diabetes mellitus without complications: Secondary | ICD-10-CM

## 2024-02-20 MED ORDER — ROSUVASTATIN CALCIUM 20 MG PO TABS: 20.0000 mg | ORAL_TABLET | Freq: Every day | ORAL | 3 refills | Status: AC

## 2024-02-20 MED ORDER — EMPAGLIFLOZIN 25 MG PO TABS: 25.0000 mg | ORAL_TABLET | Freq: Every day | ORAL | 11 refills | Status: AC

## 2024-02-23 LAB — CMP14+EGFR
ALT: 15 IU/L (ref 0–44)
AST: 16 IU/L (ref 0–40)
Albumin: 4.6 g/dL (ref 3.8–4.9)
Alkaline Phosphatase: 100 IU/L (ref 44–121)
BUN/Creatinine Ratio: 17 (ref 9–20)
BUN: 15 mg/dL (ref 6–24)
Bilirubin Total: 1 mg/dL (ref 0.0–1.2)
CO2: 21 mmol/L (ref 20–29)
Calcium: 10.2 mg/dL (ref 8.7–10.2)
Chloride: 98 mmol/L (ref 96–106)
Creatinine, Ser: 0.88 mg/dL (ref 0.76–1.27)
Globulin, Total: 2.2 g/dL (ref 1.5–4.5)
Glucose: 307 mg/dL — ABNORMAL HIGH (ref 70–99)
Potassium: 4.3 mmol/L (ref 3.5–5.2)
Sodium: 134 mmol/L (ref 134–144)
Total Protein: 6.8 g/dL (ref 6.0–8.5)
eGFR: 101 mL/min/1.73 (ref >59)

## 2024-02-23 LAB — TESTOSTERONE,FREE AND TOTAL
Testosterone, Free: 3.7 pg/mL — ABNORMAL LOW (ref 7.2–24.0)
Testosterone: 397 ng/dL (ref 264–916)

## 2024-02-23 LAB — HEMOGLOBIN A1C
Est. average glucose Bld gHb Est-mCnc: 346 mg/dL
Hgb A1c MFr Bld: 13.7 % — ABNORMAL HIGH (ref 4.8–5.6)

## 2024-02-23 LAB — TSH+FREE T4
Free T4: 1.57 ng/dL (ref 0.82–1.77)
TSH: 1.16 u[IU]/mL (ref 0.450–4.500)

## 2024-02-23 LAB — LIPID PANEL
Chol/HDL Ratio: 4.1 ratio (ref 0.0–5.0)
Cholesterol, Total: 191 mg/dL (ref 100–199)
HDL: 47 mg/dL (ref >39)
LDL Chol Calc (NIH): 115 mg/dL — ABNORMAL HIGH (ref 0–99)
Triglycerides: 163 mg/dL — ABNORMAL HIGH (ref 0–149)
VLDL Cholesterol Cal: 29 mg/dL (ref 5–40)

## 2024-03-03 ENCOUNTER — Telehealth: Admitting: Physician Assistant

## 2024-03-03 DIAGNOSIS — J069 Acute upper respiratory infection, unspecified: Secondary | ICD-10-CM

## 2024-03-03 MED ORDER — AZITHROMYCIN 250 MG PO TABS
ORAL_TABLET | ORAL | 0 refills | Status: AC
Start: 2024-03-03 — End: 2024-03-08

## 2024-03-03 NOTE — Progress Notes (Signed)
 Message sent to patient requesting further input regarding current symptoms. Awaiting patient response.

## 2024-03-03 NOTE — Progress Notes (Signed)
 E-Visit for Upper Respiratory Infection   We are sorry you are not feeling well.  Here is how we plan to help!  Based on what you have shared with me, it looks like you may have a viral upper respiratory infection.  Upper respiratory infections are caused by a large number of viruses; however, rhinovirus is the most common cause.   Symptoms vary from person to person, with common symptoms including sore throat, cough, fatigue or lack of energy and feeling of general discomfort.  A low-grade fever of up to 100.4 may present, but is often uncommon.  Symptoms vary however, and are closely related to a person's age or underlying illnesses.  The most common symptoms associated with an upper respiratory infection are nasal discharge or congestion, cough, sneezing, headache and pressure in the ears and face.  These symptoms usually persist for about 3 to 10 days, but can last up to 2 weeks.  It is important to know that upper respiratory infections do not cause serious illness or complications in most cases.    Upper respiratory infections can be transmitted from person to person, with the most common method of transmission being a person's hands.  The virus is able to live on the skin and can infect other persons for up to 2 hours after direct contact.  Also, these can be transmitted when someone coughs or sneezes; thus, it is important to cover the mouth to reduce this risk.  To keep the spread of the illness at bay, good hand hygiene is very important.  This is an infection that is most likely caused by a virus. There are no specific treatments other than to help you with the symptoms until the infection runs its course.  We are sorry you are not feeling well.  Here is how we plan to help!  If you have a sore or scratchy throat, use a saltwater gargle-  to  teaspoon of salt dissolved in a 4-ounce to 8-ounce glass of warm water .  Gargle the solution for approximately 15-30 seconds and then spit.  It is  important not to swallow the solution.  You can also use throat lozenges/cough drops and Chloraseptic spray to help with throat pain or discomfort.  Warm or cold liquids can also be helpful in relieving throat pain.  For headache, pain or general discomfort, you can use Ibuprofen  or Tylenol  as directed.   Some authorities believe that zinc sprays or the use of Echinacea may shorten the course of your symptoms.  Giving new onset of higher fever along with continued body aches and sore throat for >6 days, I am adding on an antibiotic as a precaution, for you to take as directed.  HOME CARE Only take medications as instructed by your medical team. Be sure to drink plenty of fluids. Water  is fine as well as fruit juices, sodas and electrolyte beverages. You may want to stay away from caffeine or alcohol. If you are nauseated, try taking small sips of liquids. How do you know if you are getting enough fluid? Your urine should be a pale yellow or almost colorless. Get rest. Taking a steamy shower or using a humidifier may help nasal congestion and ease sore throat pain. You can place a towel over your head and breathe in the steam from hot water  coming from a faucet. Using a saline nasal spray works much the same way. Cough drops, hard candies and sore throat lozenges may ease your cough. Avoid close contacts especially  the very young and the elderly Cover your mouth if you cough or sneeze Always remember to wash your hands.   GET HELP RIGHT AWAY IF: You develop worsening fever. If your symptoms do not improve within 10 days You develop yellow or green discharge from your nose over 3 days. You have coughing fits You develop a severe head ache or visual changes. You develop shortness of breath, difficulty breathing or start having chest pain Your symptoms persist after you have completed your treatment plan  MAKE SURE YOU  Understand these instructions. Will watch your condition. Will get help  right away if you are not doing well or get worse.  Thank you for choosing an e-visit.  Your e-visit answers were reviewed by a board certified advanced clinical practitioner to complete your personal care plan. Depending upon the condition, your plan could have included both over the counter or prescription medications.  Please review your pharmacy choice. Make sure the pharmacy is open so you can pick up prescription now. If there is a problem, you may contact your provider through [employer] and have the prescription routed to another pharmacy.  Your safety is important to us . If you have drug allergies check your prescription carefully.   For the next 24 hours you can use MyChart to ask questions about today's visit, request a non-urgent call back, or ask for a work or school excuse. You will get an email in the next two days asking about your experience. I hope that your e-visit has been valuable and will speed your recovery.

## 2024-03-03 NOTE — Progress Notes (Signed)
 I have spent 5 minutes in review of e-visit questionnaire, review and updating patient chart, medical decision making and response to patient.   Elsie Velma Lunger, PA-C

## 2024-05-04 ENCOUNTER — Encounter: Payer: Self-pay | Admitting: Physical Medicine and Rehabilitation

## 2024-05-04 ENCOUNTER — Encounter: Payer: Self-pay | Admitting: Radiology

## 2024-05-04 ENCOUNTER — Other Ambulatory Visit: Payer: Self-pay | Admitting: Internal Medicine

## 2024-05-04 ENCOUNTER — Other Ambulatory Visit: Payer: Self-pay | Admitting: Physical Medicine and Rehabilitation

## 2024-05-04 DIAGNOSIS — M5412 Radiculopathy, cervical region: Secondary | ICD-10-CM

## 2024-05-04 DIAGNOSIS — E119 Type 2 diabetes mellitus without complications: Secondary | ICD-10-CM

## 2024-05-04 NOTE — Progress Notes (Signed)
 Last cervical injection 6/25 % 70 relief/function ability Duration of relief/improvement--3 months Current pain score---6 Recent falls or injuries---None Same location and same pain in cervical as last time.

## 2024-05-07 ENCOUNTER — Telehealth: Admitting: Physician Assistant

## 2024-05-07 DIAGNOSIS — J069 Acute upper respiratory infection, unspecified: Secondary | ICD-10-CM | POA: Diagnosis not present

## 2024-05-07 MED ORDER — FLUTICASONE PROPIONATE 50 MCG/ACT NA SUSP: 2.0000 | Freq: Every day | NASAL | 0 refills | Status: AC

## 2024-05-07 MED ORDER — BENZONATATE 100 MG PO CAPS: 100.0000 mg | ORAL_CAPSULE | Freq: Three times a day (TID) | ORAL | 0 refills | Status: AC | PRN

## 2024-05-07 NOTE — Progress Notes (Signed)

## 2024-06-01 ENCOUNTER — Other Ambulatory Visit: Payer: Self-pay

## 2024-06-01 ENCOUNTER — Ambulatory Visit: Admitting: Physical Medicine and Rehabilitation

## 2024-06-01 VITALS — BP 197/107 | HR 80

## 2024-06-01 DIAGNOSIS — M5412 Radiculopathy, cervical region: Secondary | ICD-10-CM

## 2024-06-01 MED ORDER — METHYLPREDNISOLONE ACETATE 40 MG/ML IJ SUSP
40.0000 mg | Freq: Once | INTRAMUSCULAR | Status: AC
  Administered 2024-06-01: 40 mg

## 2024-06-01 NOTE — Progress Notes (Signed)
 James [name] - 58 y.o. male MRN [id number]  Date of birth: 1965-07-05  Office Visit Note: Visit Date: 06/01/2024 PCP: Glennon Sand, NP (Inactive) Referred by: Trudy Duwaine BRAVO, NP  Subjective: Chief Complaint  Patient presents with   Neck - Pain   HPI:  James [name] is a 58 y.o. male who comes in today for planned repeat Left C7-T1  Cervical Interlaminar epidural steroid injection with fluoroscopic guidance.  The patient has failed conservative care including home exercise, medications, time and activity modification.  This injection will be diagnostic and hopefully therapeutic.  Please see requesting physician notes for further details and justification. Patient received more than 50% pain relief from prior injection. High BP today. He is aware and monitoring. He has very large biceps which also may be hindering measurement.   Referring: Dr. Ozell Ada   ROS Otherwise per HPI.  Assessment & Plan: Visit Diagnoses:    ICD-10-CM   1. Cervical radiculopathy  M54.12 XR C-ARM NO REPORT    Epidural Steroid injection    methylPREDNISolone  acetate (DEPO-MEDROL ) injection 40 mg      Plan: No additional findings.   Meds & Orders:  Meds ordered this encounter  Medications   methylPREDNISolone  acetate (DEPO-MEDROL ) injection 40 mg    Orders Placed This Encounter  Procedures   XR C-ARM NO REPORT   Epidural Steroid injection    Follow-up: Return for visit to requesting provider as needed.   Procedures: No procedures performed  Cervical Epidural Steroid Injection - Interlaminar Approach with Fluoroscopic Guidance  [name]      Date of Birth: 1965-09-23 MRN: [id number] PCP: Glennon Sand, NP (Inactive)      Visit Date: 06/01/2024   Universal Protocol:    Date/Time: 12/01/251:17 PM  Consent Given By: the patient  Position: PRONE  Additional Comments: Vital signs were monitored before and after the procedure. Patient was prepped  and draped in the usual sterile fashion. The correct patient, procedure, and site was verified.   Injection Procedure Details:   Procedure diagnoses: Cervical radiculopathy [M54.12]    Meds Administered:  Meds ordered this encounter  Medications   methylPREDNISolone  acetate (DEPO-MEDROL ) injection 40 mg     Laterality: Left  Location/Site: C7-T1  Needle: 3.5 in., 20 ga. Tuohy  Needle Placement: Paramedian epidural space  Findings:  -Comments: Excellent flow of contrast into the epidural space.  Procedure Details: Using a paramedian approach from the side mentioned above, the region overlying the inferior lamina was localized under fluoroscopic visualization and the soft tissues overlying this structure were infiltrated with 4 ml. of 1% Lidocaine  without Epinephrine. A # 20 gauge, Tuohy needle was inserted into the epidural space using a paramedian approach.  The epidural space was localized using loss of resistance along with contralateral oblique bi-planar fluoroscopic views.  After negative aspirate for air, blood, and CSF, a 2 ml. volume of Isovue -250 was injected into the epidural space and the flow of contrast was observed. Radiographs were obtained for documentation purposes.   The injectate was administered into the level noted above.  Additional Comments:  The patient tolerated the procedure well Dressing: 2 x 2 sterile gauze and Band-Aid    Post-procedure details: Patient was observed during the procedure. Post-procedure instructions were reviewed.  Patient left the clinic in stable condition.   Clinical History: MRI CERVICAL SPINE WITHOUT CONTRAST   TECHNIQUE: Multiplanar, multisequence MR imaging of the cervical spine was performed. No intravenous contrast was administered.  COMPARISON:  CT cervical spine dated September 29, 2022.   FINDINGS: Alignment: Straightening of the normal cervical lordosis. No listhesis.   Vertebrae: No fracture, evidence of  discitis, or bone lesion.   Cord: Normal signal and morphology.   Posterior Fossa, vertebral arteries, paraspinal tissues: Negative.   Disc levels:   C2-C3: Negative disc. Severe left facet arthropathy mild bilateral uncovertebral hypertrophy. Mild-to-moderate left neuroforaminal stenosis. No spinal canal or right neuroforaminal stenosis.   C3-C4: Mild disc bulging with moderate right and mild left facet uncovertebral hypertrophy. Slight flattening of the ventral cord. Mild spinal canal stenosis. Severe right neuroforaminal stenosis. No left neuroforaminal stenosis.   C4-C5: Negative disc. Mild bilateral uncovertebral hypertrophy. Mild-to-moderate right and mild left neuroforaminal stenosis. No spinal canal stenosis.   C5-C6: Tiny shallow central disc protrusion and mild bilateral uncovertebral hypertrophy. Moderate right neuroforaminal stenosis. No spinal canal or left neuroforaminal stenosis.   C6-C7: Mild disc bulging with superimposed large right foraminal disc protrusion. Severe right neuroforaminal stenosis and impingement of the exiting right C7 nerve root. Slight flattening of the far right ventral cord. No spinal canal or left neuroforaminal stenosis.   C7-T1:  Negative.   IMPRESSION: 1. Large right foraminal disc protrusion at C6-C7 resulting in severe right neuroforaminal stenosis and impingement of the exiting right C7 nerve root. 2. Severe right neuroforaminal stenosis at C3-C4. 3. Moderate right neuroforaminal stenosis at C5-C6.     Electronically Signed   By: Elsie ONEIDA Shoulder M.D.   On: 10/13/2022 17:38     Objective:  VS:  HT:    WT:   BMI:     BP:(!) 197/107  HR:80bpm  TEMP: ( )  RESP:  Physical Exam Vitals and nursing note reviewed.  Constitutional:      General: He is not in acute distress.    Appearance: Normal appearance. He is obese. He is not ill-appearing.  HENT:     Head: Normocephalic and atraumatic.     Right Ear: External ear  normal.     Left Ear: External ear normal.  Eyes:     Extraocular Movements: Extraocular movements intact.  Cardiovascular:     Rate and Rhythm: Normal rate.     Pulses: Normal pulses.  Abdominal:     General: There is no distension.     Palpations: Abdomen is soft.  Musculoskeletal:        General: No signs of injury.     Cervical back: Neck supple. Tenderness present. No rigidity.     Right lower leg: No edema.     Left lower leg: No edema.     Comments: Patient has good strength in the upper extremities with 5 out of 5 strength in wrist extension long finger flexion APB.  No intrinsic hand muscle atrophy.  Negative Hoffmann's test.  Lymphadenopathy:     Cervical: No cervical adenopathy.  Skin:    Findings: No erythema or rash.  Neurological:     General: No focal deficit present.     Mental Status: He is alert and oriented to person, place, and time.     Sensory: No sensory deficit.     Motor: No weakness or abnormal muscle tone.     Coordination: Coordination normal.  Psychiatric:        Mood and Affect: Mood normal.        Behavior: Behavior normal.      Imaging: No results found.

## 2024-06-01 NOTE — Progress Notes (Signed)
 Pain Scale   Average Pain 4 Patient advising he has chronic neck pain that radiates to right shoulder area at times. Patient advising his Blood pressure is high due to medication change, Blood pressure taken Manually 180/110, patient advised to follow up with PCP.        +Driver, -BT, -Dye Allergies.

## 2024-06-01 NOTE — Procedures (Signed)
 Cervical Epidural Steroid Injection - Interlaminar Approach with Fluoroscopic Guidance  [name]      Date of Birth: Dec 06, 1965 MRN: [id number] PCP: Glennon Sand, NP (Inactive)      Visit Date: 06/01/2024   Universal Protocol:    Date/Time: 12/01/251:17 PM  Consent Given By: the patient  Position: PRONE  Additional Comments: Vital signs were monitored before and after the procedure. Patient was prepped and draped in the usual sterile fashion. The correct patient, procedure, and site was verified.   Injection Procedure Details:   Procedure diagnoses: Cervical radiculopathy [M54.12]    Meds Administered:  Meds ordered this encounter  Medications   methylPREDNISolone  acetate (DEPO-MEDROL ) injection 40 mg     Laterality: Left  Location/Site: C7-T1  Needle: 3.5 in., 20 ga. Tuohy  Needle Placement: Paramedian epidural space  Findings:  -Comments: Excellent flow of contrast into the epidural space.  Procedure Details: Using a paramedian approach from the side mentioned above, the region overlying the inferior lamina was localized under fluoroscopic visualization and the soft tissues overlying this structure were infiltrated with 4 ml. of 1% Lidocaine  without Epinephrine. A # 20 gauge, Tuohy needle was inserted into the epidural space using a paramedian approach.  The epidural space was localized using loss of resistance along with contralateral oblique bi-planar fluoroscopic views.  After negative aspirate for air, blood, and CSF, a 2 ml. volume of Isovue -250 was injected into the epidural space and the flow of contrast was observed. Radiographs were obtained for documentation purposes.   The injectate was administered into the level noted above.  Additional Comments:  The patient tolerated the procedure well Dressing: 2 x 2 sterile gauze and Band-Aid    Post-procedure details: Patient was observed during the procedure. Post-procedure  instructions were reviewed.  Patient left the clinic in stable condition.

## 2024-06-12 ENCOUNTER — Ambulatory Visit: Admitting: Surgical

## 2024-06-12 ENCOUNTER — Other Ambulatory Visit: Payer: Self-pay

## 2024-06-12 DIAGNOSIS — M5416 Radiculopathy, lumbar region: Secondary | ICD-10-CM | POA: Diagnosis not present

## 2024-06-12 DIAGNOSIS — M25551 Pain in right hip: Secondary | ICD-10-CM

## 2024-06-12 DIAGNOSIS — M47816 Spondylosis without myelopathy or radiculopathy, lumbar region: Secondary | ICD-10-CM

## 2024-06-21 ENCOUNTER — Inpatient Hospital Stay: Admission: RE | Admit: 2024-06-21 | Discharge: 2024-06-21 | Attending: Surgical

## 2024-06-21 ENCOUNTER — Encounter: Payer: Self-pay | Admitting: Surgical

## 2024-06-21 DIAGNOSIS — M25551 Pain in right hip: Secondary | ICD-10-CM

## 2024-06-21 NOTE — Progress Notes (Signed)
 "  Office Visit Note   [name]           Date of Birth: Nov 18, 1965           MRN: [id number] Visit Date: 06/12/2024 Requested by: No referring provider defined for this encounter. PCP: Glennon Sand, NP (Inactive)  Subjective: Chief Complaint  Patient presents with   Right Hip - Pain    HPI: James [name] is a 58 y.o. male who presents to the office reporting right hip pain.  Patient states that he feels like his hip feels like it did before surgery.  He has history of prior gluteal tendon repair in 2021 by Dr. Addie.  He describes lateral hip pain and burning sensation.  He has a burning sensation that is present nearly all the time.  It will radiate down the IT band but not quite down to the knee.  He cannot lay on his right side.  He does have occasional groin pain.  Pain is worse with walking.  He notes a few months of symptoms has been steadily worsening.  No recent injury.  He does want to give out on him.  It will cause him to walk with an abnormal gait.  No changes chronic back pain.  Does note consistent pain in the right buttock region as well.  No numbness or tingling.              ROS: All systems reviewed are negative as they relate to the chief complaint within the history of present illness.  Patient denies fevers or chills.  Assessment & Plan: Visit Diagnoses:  1. Pain in right hip   2. Spondylosis without myelopathy or radiculopathy, lumbar region   3. Lumbar radiculopathy     Plan: Impression is 58 year old male who presents with lateral right hip pain and buttock pain.  He has elements that are suspicious for referred pain from the lumbar spine especially with the near constant burning sensation and the associated back pain/buttock pain.  However he does have pain reproduced with palpation over the trochanter and asymmetric weakness of active hip internal rotation relative to his other hip.  After discussion of options, plan for lumbar spine  ESI diagnostically with Dr. Eldonna and MRI of the right hip to further evaluate gluteal tendinopathy with his history of prior gluteal tendon repair in 2021.  Follow-up after MRI to review results and discuss how he is doing following ESI.  Follow-Up Instructions: No follow-ups on file.   Orders:  Orders Placed This Encounter  Procedures   XR HIP UNILAT W OR W/O PELVIS 2-3 VIEWS RIGHT   MR Hip Right w/o contrast   Ambulatory referral to Physical Medicine Rehab   No orders of the defined types were placed in this encounter.     Procedures: No procedures performed   Clinical Data: No additional findings.  Objective: Vital Signs: There were no vitals taken for this visit.  Physical Exam:  Constitutional: Patient appears well-developed HEENT:  Head: Normocephalic Eyes:EOM are normal Neck: Normal range of motion Cardiovascular: Normal rate Pulmonary/chest: Effort normal Neurologic: Patient is alert Skin: Skin is warm Psychiatric: Patient has normal mood and affect  Ortho Exam: Ortho exam demonstrates well-healed incision over the right greater trochanter.  There is tenderness to palpation over the greater trochanter particularly in the lateral and posterior aspects of the trochanter.  He ambulates without Trendelenburg gait.  He does have difficulty with active hip internal rotation of the  right hip without such difficulty in the left hip.  He is able to perform hip abduction in the lateral position though this does reproduce his pain.  Intact hip flexion, quadricep, hamstring, dorsiflexion, plantarflexion, EHL strength rated 5/5 bilaterally.  No tenderness over the ASIS or AIIS.  Negative straight leg raise on the left but positive on the right.  Specialty Comments:  MRI CERVICAL SPINE WITHOUT CONTRAST   TECHNIQUE: Multiplanar, multisequence MR imaging of the cervical spine was performed. No intravenous contrast was administered.   COMPARISON:  CT cervical spine dated September 29, 2022.   FINDINGS: Alignment: Straightening of the normal cervical lordosis. No listhesis.   Vertebrae: No fracture, evidence of discitis, or bone lesion.   Cord: Normal signal and morphology.   Posterior Fossa, vertebral arteries, paraspinal tissues: Negative.   Disc levels:   C2-C3: Negative disc. Severe left facet arthropathy mild bilateral uncovertebral hypertrophy. Mild-to-moderate left neuroforaminal stenosis. No spinal canal or right neuroforaminal stenosis.   C3-C4: Mild disc bulging with moderate right and mild left facet uncovertebral hypertrophy. Slight flattening of the ventral cord. Mild spinal canal stenosis. Severe right neuroforaminal stenosis. No left neuroforaminal stenosis.   C4-C5: Negative disc. Mild bilateral uncovertebral hypertrophy. Mild-to-moderate right and mild left neuroforaminal stenosis. No spinal canal stenosis.   C5-C6: Tiny shallow central disc protrusion and mild bilateral uncovertebral hypertrophy. Moderate right neuroforaminal stenosis. No spinal canal or left neuroforaminal stenosis.   C6-C7: Mild disc bulging with superimposed large right foraminal disc protrusion. Severe right neuroforaminal stenosis and impingement of the exiting right C7 nerve root. Slight flattening of the far right ventral cord. No spinal canal or left neuroforaminal stenosis.   C7-T1:  Negative.   IMPRESSION: 1. Large right foraminal disc protrusion at C6-C7 resulting in severe right neuroforaminal stenosis and impingement of the exiting right C7 nerve root. 2. Severe right neuroforaminal stenosis at C3-C4. 3. Moderate right neuroforaminal stenosis at C5-C6.     Electronically Signed   By: Elsie ONEIDA Shoulder M.D.   On: 10/13/2022 17:38  Imaging: No results found.   PMFS History: Patient Active Problem List   Diagnosis Date Noted   Other fatigue 02/18/2024   Overweight 02/18/2024   Erectile disorder 02/18/2024   Bacterial sinusitis 09/19/2023    Oral candidiasis 09/19/2023   Hyperlipidemia associated with type 2 diabetes mellitus (HCC) 04/11/2023   History of hypogonadism 01/31/2023   Colon cancer screening 01/31/2023   Synovitis of right shoulder    Complete tear of right rotator cuff    Family history of breast cancer 08/17/2021   Family history of breast cancer gene mutation in first degree relative 08/17/2021   Type 2 diabetes mellitus with other specified complication (HCC) 10/03/2018   Osteochondritis dissecans of right talus    Essential hypertension, benign 06/08/2016   Routine general medical examination at a health care facility 04/12/2016   Rash and nonspecific skin eruption 03/23/2015   Obstructive apnea 04/21/2014   Past Medical History:  Diagnosis Date   ADHD (attention deficit hyperactivity disorder)    as a child, per patient out grown it as an adult   Chicken pox    Diabetes mellitus without complication (HCC)    type 2   Family history of breast cancer    Family history of breast cancer gene mutation in first degree relative    History of kidney stones    surgery to remove   Hyperlipidemia    Hx:  no meds, diet controlled   Hypertension  Sleep apnea    CiPaP at night, every night    Family History  Problem Relation Age of Onset   Breast cancer Mother 75       BARD1+   Prostate cancer Father    Diabetes Father    Cancer Sister        bile duct d. 69   Breast cancer Maternal Aunt        dx 12s   Multiple myeloma Maternal Grandmother        d. 16s   Breast cancer Maternal Great-grandmother        dx 17s   Anesthesia problems Neg Hx    Hypotension Neg Hx    Malignant hyperthermia Neg Hx    Pseudochol deficiency Neg Hx    Colon cancer Neg Hx    Colon polyps Neg Hx    Esophageal cancer Neg Hx    Rectal cancer Neg Hx    Stomach cancer Neg Hx     Past Surgical History:  Procedure Laterality Date   ANKLE ARTHROSCOPY Right 06/21/2017   Procedure: RIGHT ANKLE ARTHROSCOPY WITH DEBRIDEMENT;   Surgeon: Harden Jerona GAILS, MD;  Location: [facility] OR;  Service: Orthopedics;  Laterality: Right;   CYSTOSCOPY W/ URETERAL STENT PLACEMENT  08/07/2011   Procedure: CYSTOSCOPY WITH RETROGRADE PYELOGRAM/URETERAL STENT PLACEMENT;  Surgeon: Emery LILLETTE Blaze, MD;  Location: [facility];  Service: Urology;  Laterality: Left;  Cystoscopy, Left Retrograde Pyelogram, Left Ureteral Ballon Dilation, Double J Stent Placement   GLUTEUS MINIMUS REPAIR Right 07/28/2019   Procedure: RIGHT HIP TENDON TEAR REPAIR;  Surgeon: Addie Cordella Hamilton, MD;  Location: [facility];  Service: Orthopedics;  Laterality: Right;   MENISCUS REPAIR     left knee 2018   SHOULDER OPEN ROTATOR CUFF REPAIR Right 08/07/2021   Procedure: right  shoulder arthroscopy, debridement, mini open rotator cuff tear repair;  Surgeon: Addie Cordella Hamilton, MD;  Location: [facility] OR;  Service: Orthopedics;  Laterality: Right;   SHOULDER SURGERY     bilateral   Social History   Occupational History   Occupation: Data Processing Manager  Tobacco Use   Smoking status: Never   Smokeless tobacco: Never  Vaping Use   Vaping status: Never Used  Substance and Sexual Activity   Alcohol use: No   Drug use: No   Sexual activity: Yes        "

## 2024-07-07 ENCOUNTER — Encounter: Payer: Self-pay | Admitting: Family Medicine

## 2024-07-07 NOTE — Telephone Encounter (Signed)
"  scheduled  "

## 2024-07-09 ENCOUNTER — Telehealth: Payer: Self-pay | Admitting: Family Medicine

## 2024-07-09 DIAGNOSIS — B349 Viral infection, unspecified: Secondary | ICD-10-CM | POA: Diagnosis not present

## 2024-07-09 MED ORDER — GUAIFENESIN 100 MG/5ML PO LIQD: 5.0000 mL | ORAL | 0 refills | Status: AC | PRN

## 2024-07-09 NOTE — Progress Notes (Unsigned)
 "  Virtual Visit via Video Note  I connected with James [name] on 07/09/2024 at 10:00 AM EST by a video enabled telemedicine application and verified that I am speaking with the correct person using two identifiers.  Patient Location: Home Provider Location: Home Office  I discussed the limitations, risks, security, and privacy concerns of performing an evaluation and management service by video and the availability of in person appointments. I also discussed with the patient that there may be a patient responsible charge related to this service. The patient expressed understanding and agreed to proceed.  Subjective: PCP: Glennon Sand, NP (Inactive)  Chief Complaint  Patient presents with   flu like symptoms    Flu like symptoms , fever, achy joints, runny nose, congestion since Monday    HPI  Got better today,less drinage. When ;aying flate eveyrthing becomes an issure,   Taking daytime mutile flu relief and mucinex , and drinking lots of fluids Feels someitthng in chest that needs to breask up but breasking Only cugh when laying down cough.   Soninlaw been sick, worse than him.   Viral Illness:  I recommend symptomatic treatments with the following: Start taking Promethazine  DM 5 mL by mouth every 4 hours as needed for cough and cold symptoms. Increase fluid intake and allow for plenty of rest. Take Tylenol  as needed for pain, fever, or general discomfort. Perform warm saltwater gargles 3-4 times daily to help with throat pain or discomfort. (Mix 1/2 teaspoon of salt in a glass of warm water  and gargle several times daily to reduce throat inflammation and soothe irritation.) Ginger tea: Reduces throat irritation and can help with nausea. Look for sugar-free or honey-based throat lozenges to help with a sore throat. Use a humidifier at bedtime to help with cough and nasal congestion. For nasal congestion: The use of heated humidified air is a safe and effective therapy.  Saline nasal sprays may also help alleviate nasal symptoms of the common cold. For cough: Treatment with honey may reduce cough frequency and severity. Follow up if your symptoms do not improve after 7 to 10 days of symptom onset.   ROS: Per HPI Current Medications[1]  Observations/Objective: There were no vitals filed for this visit. Physical Exam  Assessment and Plan: There are no diagnoses linked to this encounter.  Follow Up Instructions: No follow-ups on file.   I discussed the assessment and treatment plan with the patient. The patient was provided an opportunity to ask questions, and all were answered. The patient agreed with the plan and demonstrated an understanding of the instructions.   The patient was advised to call back or seek an in-person evaluation if the symptoms worsen or if the condition fails to improve as anticipated.  The above assessment and management plan was discussed with the patient. The patient verbalized understanding of and has agreed to the management plan.   James Sloniker  Z Bacchus, FNP    [1]  Current Outpatient Medications:    benzonatate  (TESSALON ) 100 MG capsule, Take 1-2 capsules (100-200 mg total) by mouth 3 (three) times daily as needed., Disp: 30 capsule, Rfl: 0   empagliflozin  (JARDIANCE ) 25 MG TABS tablet, Take 1 tablet (25 mg total) by mouth daily., Disp: 30 tablet, Rfl: 11   fluticasone  (FLONASE ) 50 MCG/ACT nasal spray, Place 2 sprays into both nostrils daily., Disp: 16 g, Rfl: 0   Multiple Vitamins-Minerals (MULTIVITAMIN WITH MINERALS) tablet, Take 1 tablet by mouth daily. (Patient not taking: Reported on 02/18/2024), Disp: ,  Rfl:    ONETOUCH ULTRA TEST test strip, USE AS DIRECTED TO TEST BLOOD SUGAR THREE TIMES DAILY, Disp: 100 strip, Rfl: 5   rosuvastatin  (CRESTOR ) 20 MG tablet, Take 1 tablet (20 mg total) by mouth daily., Disp: 90 tablet, Rfl: 3   sildenafil  (VIAGRA ) 100 MG tablet, Take 0.5-1 tablets (50-100 mg total) by mouth daily as  needed for erectile dysfunction., Disp: 5 tablet, Rfl: 11  "

## 2024-07-14 ENCOUNTER — Telehealth: Payer: Self-pay | Admitting: Physical Medicine and Rehabilitation

## 2024-07-14 NOTE — Telephone Encounter (Signed)
 Pt called wanting to cancel his apr for tomorrow at 2:30.Call back number is [phone]

## 2024-07-15 ENCOUNTER — Encounter: Admitting: Physical Medicine and Rehabilitation
